# Patient Record
Sex: Female | Born: 1985 | Race: Black or African American | Hispanic: No | Marital: Single | Smoking: Former smoker
Health system: Southern US, Community
[De-identification: ages and names within clinical notes are randomized; demographics above are authoritative.]

## PROBLEM LIST (undated history)

## (undated) ENCOUNTER — Inpatient Hospital Stay (HOSPITAL_COMMUNITY): Payer: Self-pay

## (undated) ENCOUNTER — Emergency Department (HOSPITAL_COMMUNITY): Admission: EM

## (undated) DIAGNOSIS — A630 Anogenital (venereal) warts: Secondary | ICD-10-CM

## (undated) DIAGNOSIS — N390 Urinary tract infection, site not specified: Secondary | ICD-10-CM

## (undated) DIAGNOSIS — Z9289 Personal history of other medical treatment: Secondary | ICD-10-CM

## (undated) DIAGNOSIS — D649 Anemia, unspecified: Secondary | ICD-10-CM

## (undated) DIAGNOSIS — D219 Benign neoplasm of connective and other soft tissue, unspecified: Secondary | ICD-10-CM

## (undated) DIAGNOSIS — O139 Gestational [pregnancy-induced] hypertension without significant proteinuria, unspecified trimester: Secondary | ICD-10-CM

## (undated) DIAGNOSIS — K219 Gastro-esophageal reflux disease without esophagitis: Secondary | ICD-10-CM

## (undated) DIAGNOSIS — K5900 Constipation, unspecified: Secondary | ICD-10-CM

## (undated) HISTORY — PX: HERNIA REPAIR: SHX51

## (undated) HISTORY — PX: WISDOM TOOTH EXTRACTION: SHX21

---

## 2007-04-12 ENCOUNTER — Ambulatory Visit: Payer: Self-pay | Admitting: *Deleted

## 2007-04-14 ENCOUNTER — Ambulatory Visit (HOSPITAL_COMMUNITY): Admission: RE | Admit: 2007-04-14 | Discharge: 2007-04-14 | Payer: Self-pay | Admitting: Obstetrics

## 2007-04-26 ENCOUNTER — Ambulatory Visit: Payer: Self-pay | Admitting: *Deleted

## 2007-04-29 ENCOUNTER — Ambulatory Visit: Payer: Self-pay | Admitting: *Deleted

## 2007-04-29 ENCOUNTER — Inpatient Hospital Stay (HOSPITAL_COMMUNITY): Admission: AD | Admit: 2007-04-29 | Discharge: 2007-04-29 | Payer: Self-pay | Admitting: Obstetrics & Gynecology

## 2007-05-10 ENCOUNTER — Ambulatory Visit: Payer: Self-pay | Admitting: *Deleted

## 2007-05-12 ENCOUNTER — Ambulatory Visit (HOSPITAL_COMMUNITY): Admission: RE | Admit: 2007-05-12 | Discharge: 2007-05-12 | Payer: Self-pay | Admitting: *Deleted

## 2007-05-24 ENCOUNTER — Ambulatory Visit: Payer: Self-pay | Admitting: *Deleted

## 2007-05-26 ENCOUNTER — Ambulatory Visit (HOSPITAL_COMMUNITY): Admission: RE | Admit: 2007-05-26 | Discharge: 2007-05-26 | Payer: Self-pay | Admitting: *Deleted

## 2007-06-21 ENCOUNTER — Ambulatory Visit (HOSPITAL_COMMUNITY): Admission: RE | Admit: 2007-06-21 | Discharge: 2007-06-21 | Payer: Self-pay | Admitting: *Deleted

## 2007-06-21 ENCOUNTER — Ambulatory Visit: Payer: Self-pay | Admitting: Obstetrics & Gynecology

## 2007-06-28 ENCOUNTER — Ambulatory Visit: Payer: Self-pay | Admitting: *Deleted

## 2007-06-29 ENCOUNTER — Ambulatory Visit: Payer: Self-pay | Admitting: Obstetrics and Gynecology

## 2007-07-05 ENCOUNTER — Inpatient Hospital Stay (HOSPITAL_COMMUNITY): Admission: AD | Admit: 2007-07-05 | Discharge: 2007-07-08 | Payer: Self-pay | Admitting: Obstetrics & Gynecology

## 2007-07-05 ENCOUNTER — Ambulatory Visit: Payer: Self-pay | Admitting: Obstetrics & Gynecology

## 2007-07-05 ENCOUNTER — Encounter: Payer: Self-pay | Admitting: *Deleted

## 2009-01-29 ENCOUNTER — Inpatient Hospital Stay (HOSPITAL_COMMUNITY): Admission: AD | Admit: 2009-01-29 | Discharge: 2009-01-29 | Payer: Self-pay | Admitting: Obstetrics and Gynecology

## 2009-03-18 ENCOUNTER — Inpatient Hospital Stay (HOSPITAL_COMMUNITY): Admission: AD | Admit: 2009-03-18 | Discharge: 2009-03-18 | Payer: Self-pay | Admitting: Obstetrics & Gynecology

## 2009-04-05 ENCOUNTER — Inpatient Hospital Stay (HOSPITAL_COMMUNITY): Admission: AD | Admit: 2009-04-05 | Discharge: 2009-04-05 | Payer: Self-pay | Admitting: Obstetrics & Gynecology

## 2009-05-08 ENCOUNTER — Inpatient Hospital Stay (HOSPITAL_COMMUNITY): Admission: AD | Admit: 2009-05-08 | Discharge: 2009-05-08 | Payer: Self-pay | Admitting: Obstetrics & Gynecology

## 2009-06-17 ENCOUNTER — Inpatient Hospital Stay (HOSPITAL_COMMUNITY): Admission: AD | Admit: 2009-06-17 | Discharge: 2009-06-17 | Payer: Self-pay | Admitting: Obstetrics & Gynecology

## 2009-07-22 ENCOUNTER — Inpatient Hospital Stay (HOSPITAL_COMMUNITY): Admission: AD | Admit: 2009-07-22 | Discharge: 2009-07-22 | Payer: Self-pay | Admitting: Obstetrics and Gynecology

## 2009-08-04 IMAGING — US US OB DETAIL+14 WK
1 series · 14 of 28 positions shown · non-contrast
Comparison: none

OBSTETRICAL ULTRASOUND:
 This ultrasound was performed in The [HOSPITAL], and the [facility] report will be stored to [REDACTED] PACS.

[Series 1: us ob detail+14 wk · 14 of 95 slices shown]
[im 4/95]
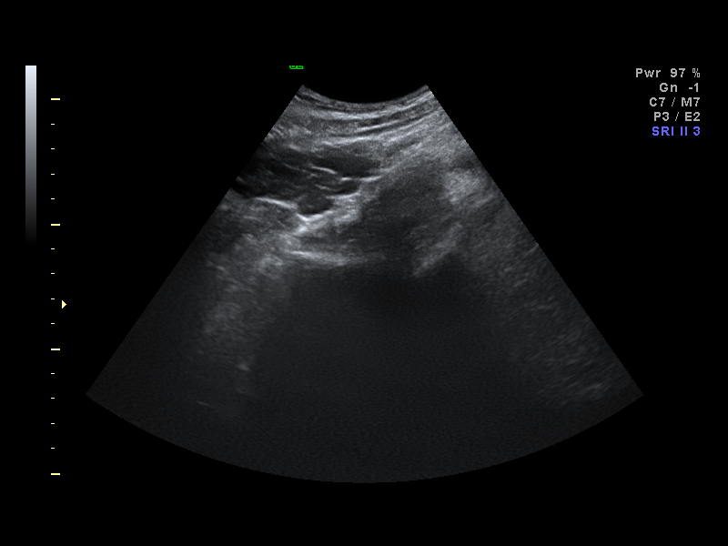
[im 11/95]
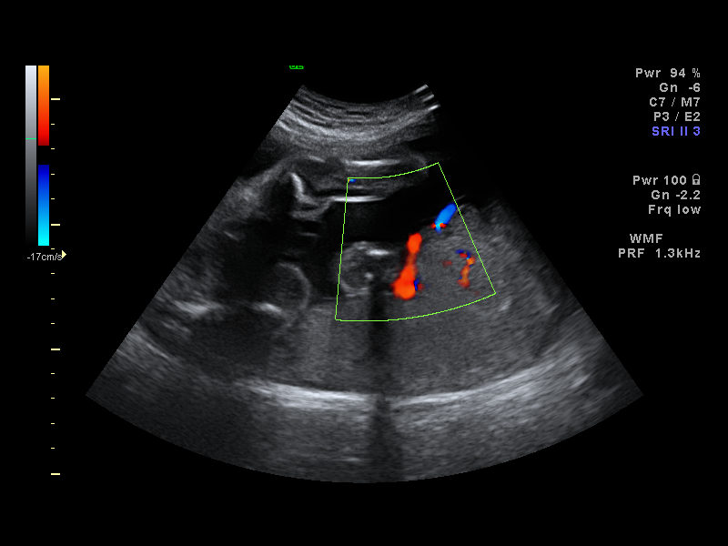
[im 18/95]
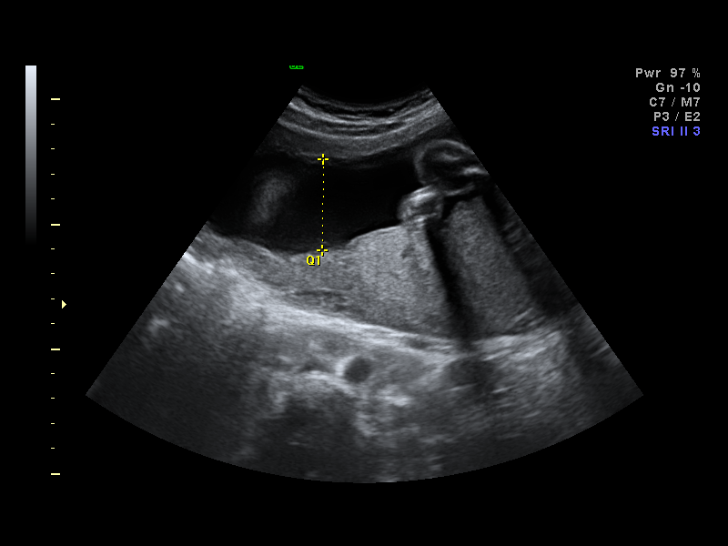
[im 25/95]
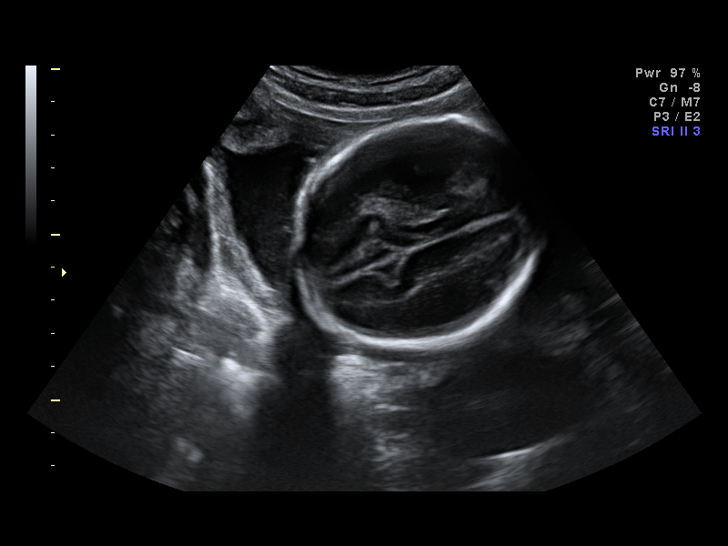
[im 32/95]
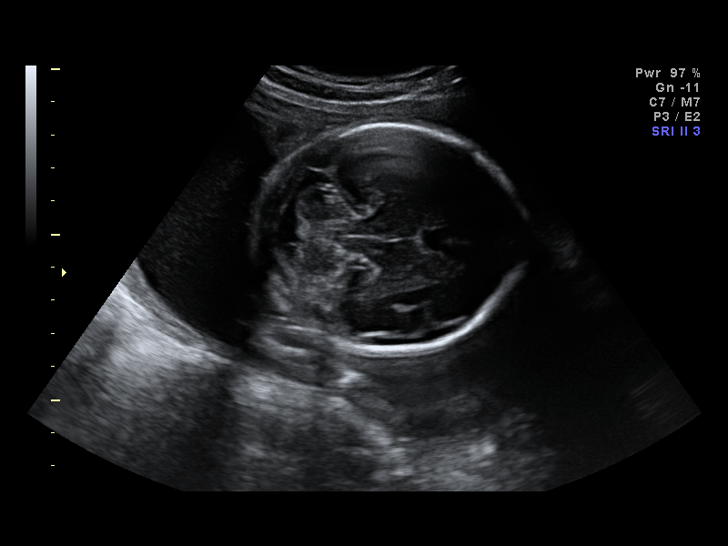
[im 39/95]
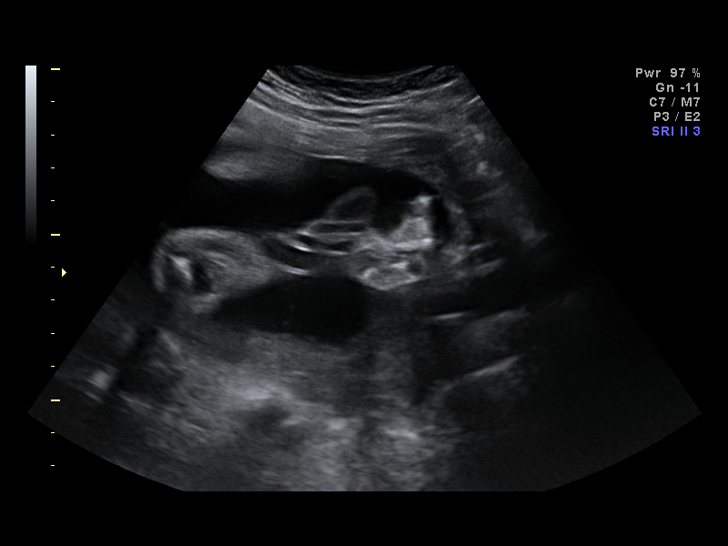
[im 46/95]
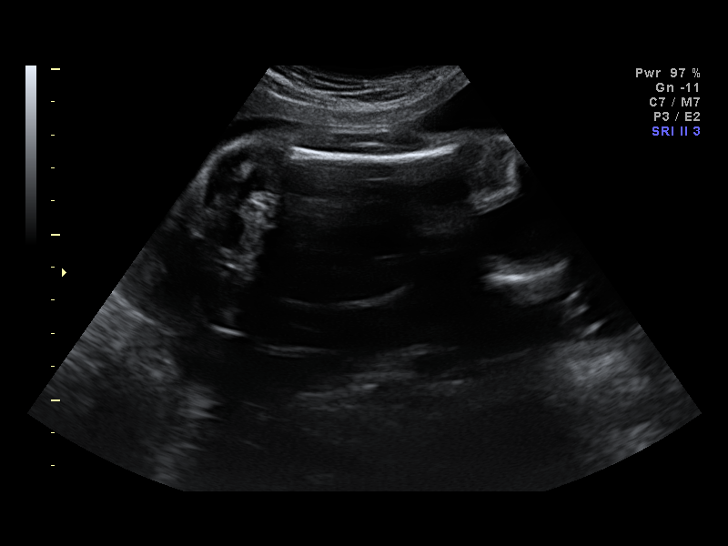
[im 53/95]
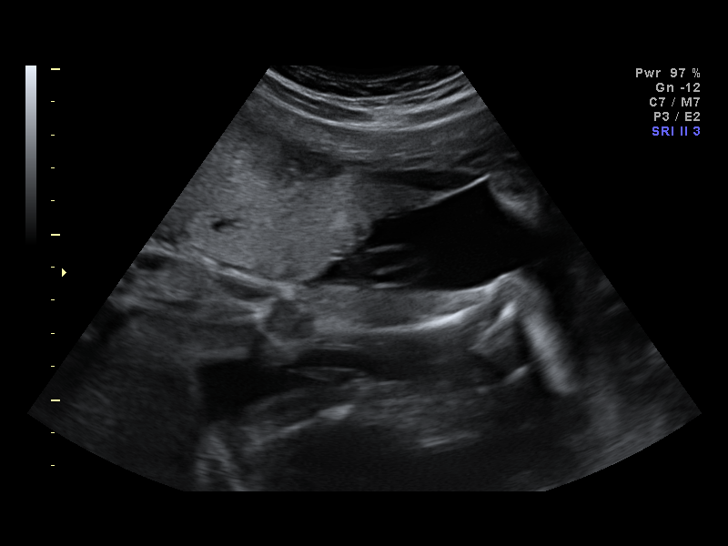
[im 60/95]
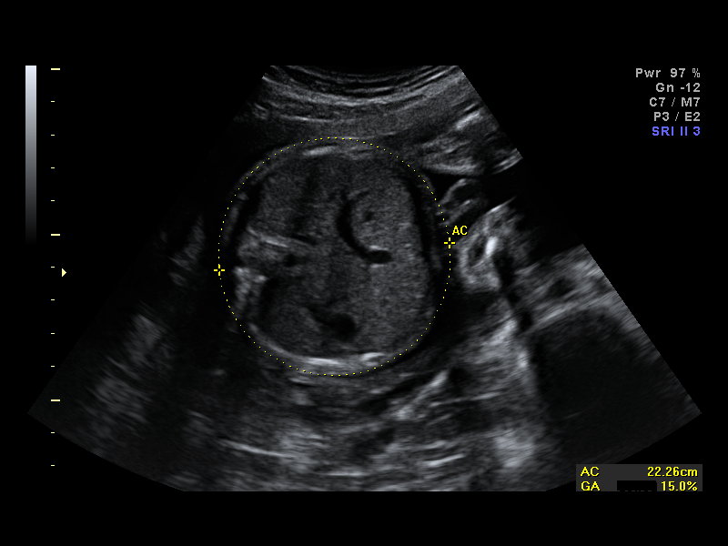
[im 67/95]
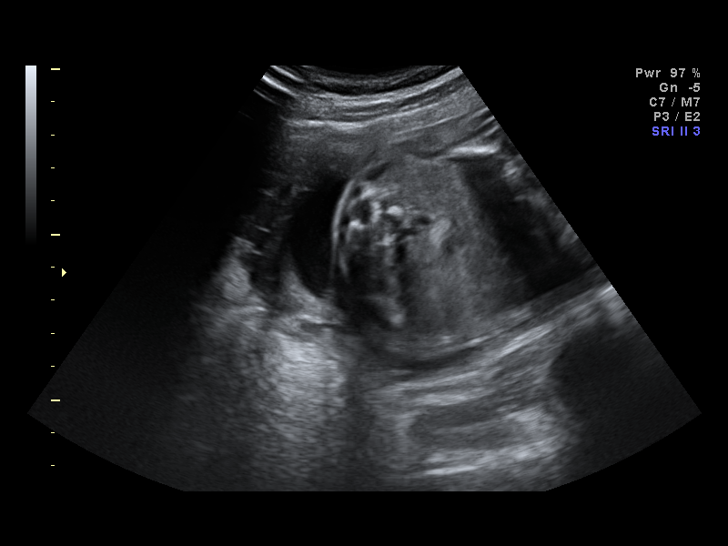
[im 74/95]
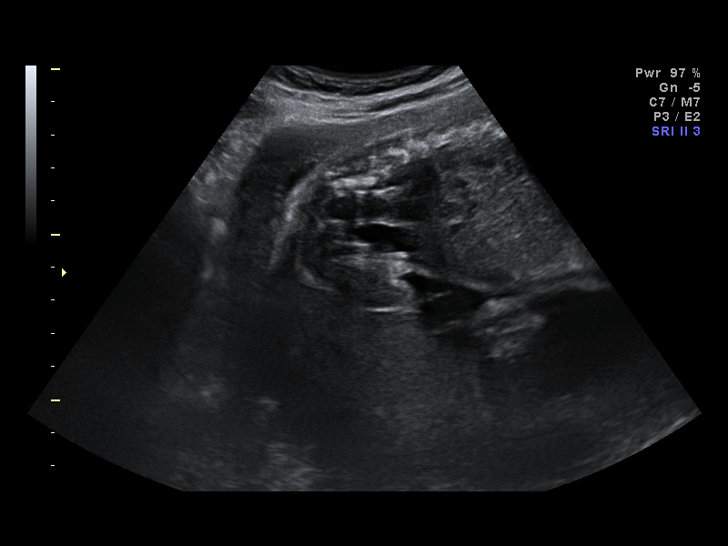
[im 81/95]
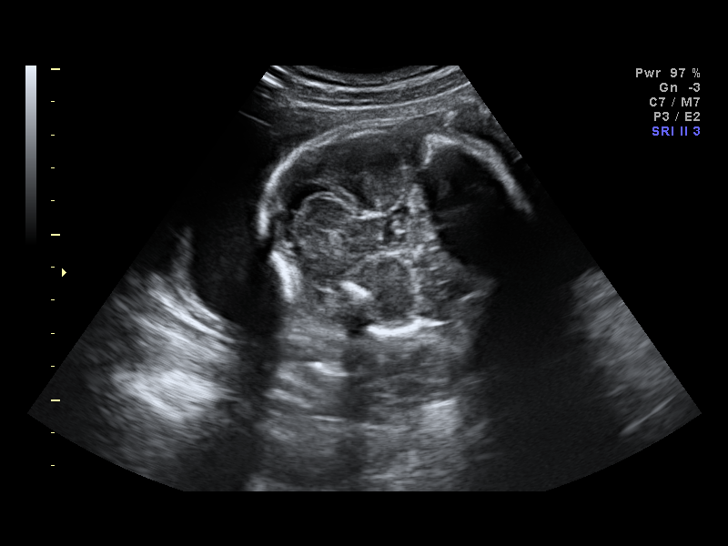
[im 88/95]
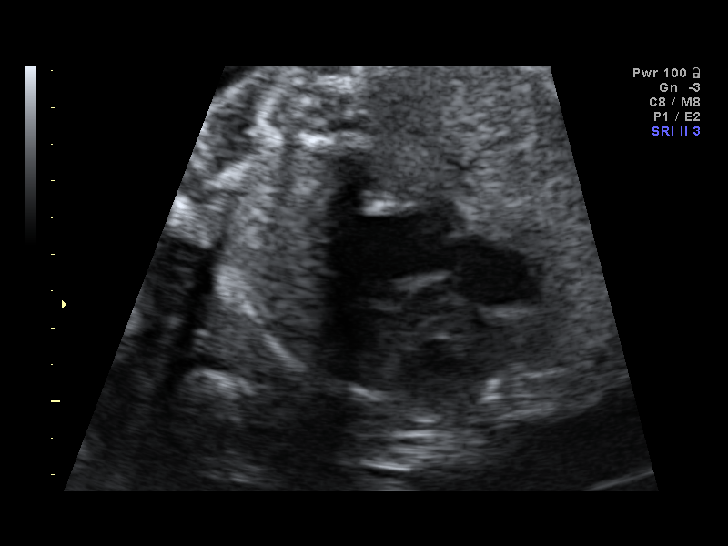
[im 95/95]
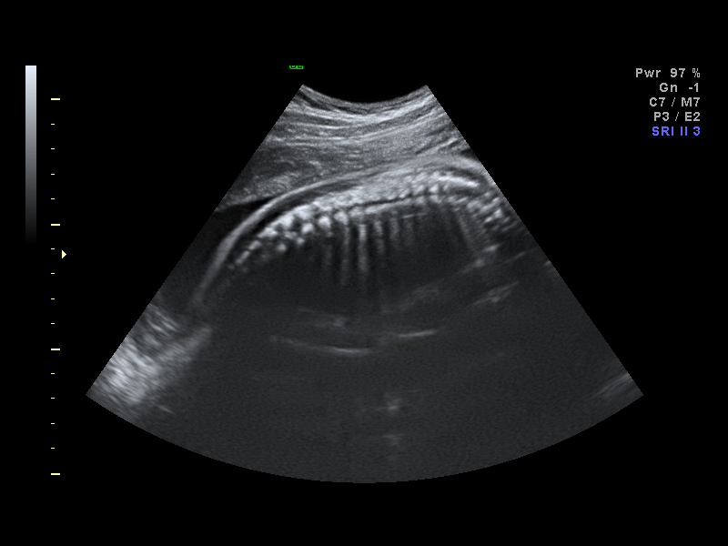

[14 of 28 positions shown; findings below may reference images not displayed]

IMPRESSION: The [facility] report has also been faxed to the ordering physician.

## 2009-09-01 IMAGING — US US OB FOLLOW-UP
1 series · 14 of 28 positions shown · non-contrast
Comparison: none

OBSTETRICAL ULTRASOUND:
 This ultrasound was performed in The [HOSPITAL], and the [facility] report will be stored to [REDACTED] PACS.

[Series 1: us ob follow-up · 34 acquisitions, 14 frames shown]
[im 2/34]
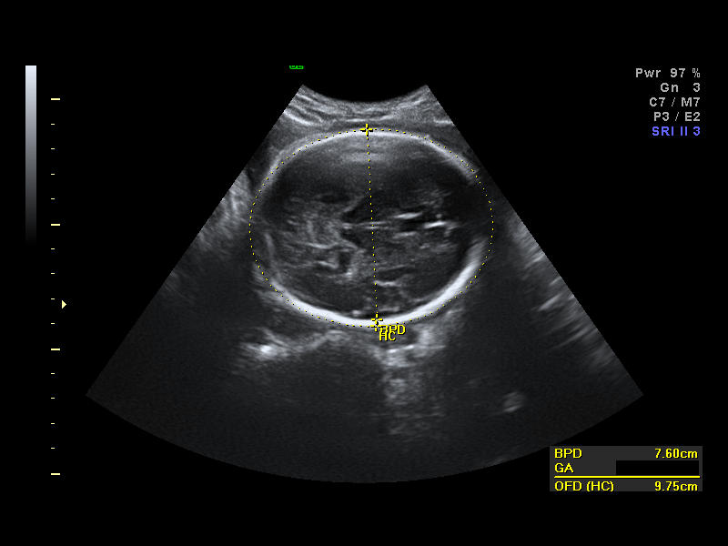
[im 4/34]
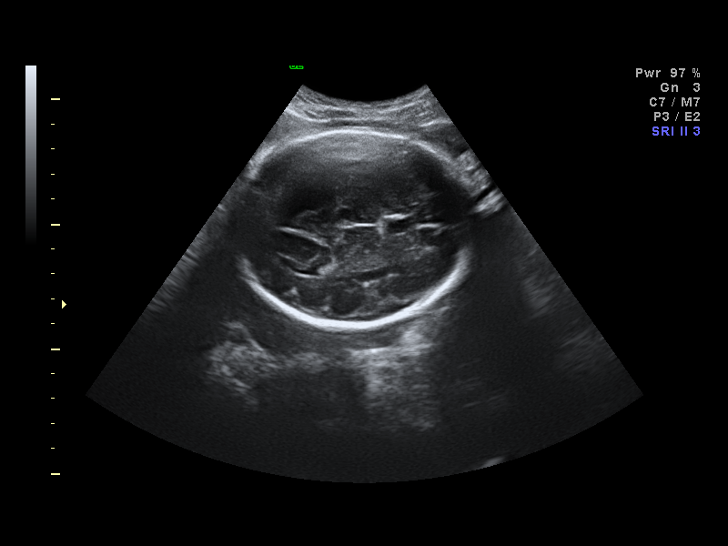
[im 7/34]
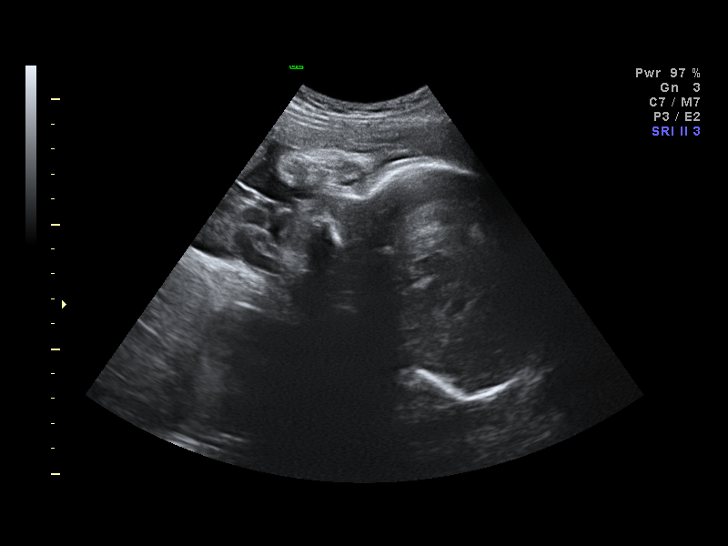
[im 9/34]
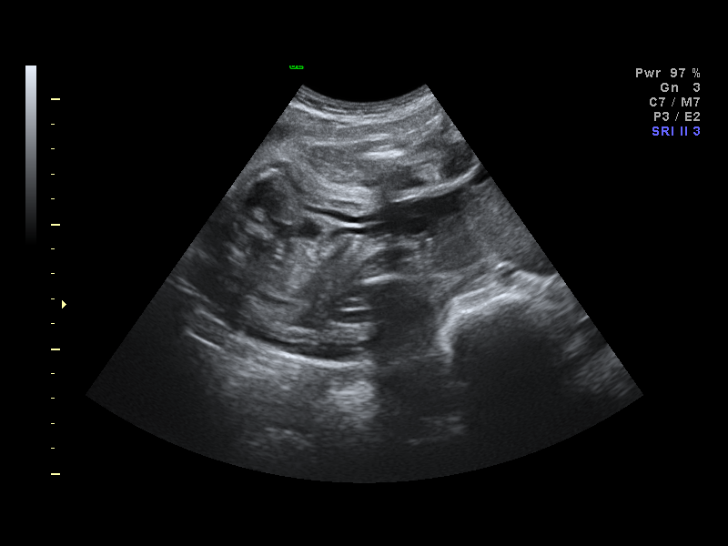
[im 12/34]
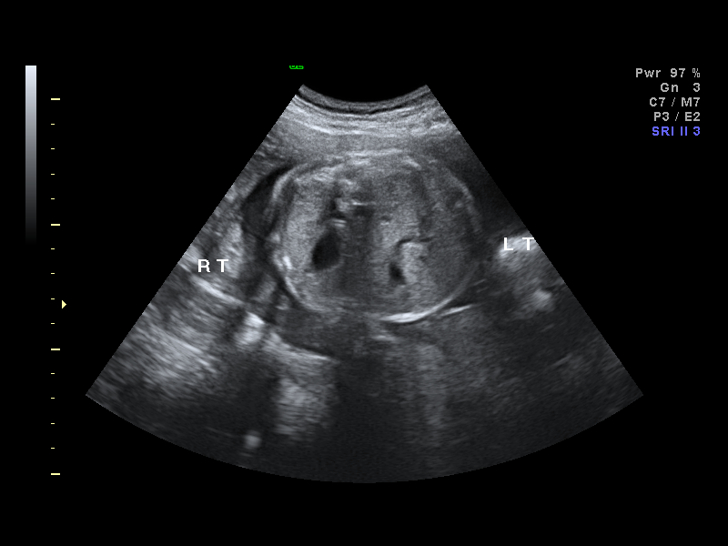
[im 14/34]
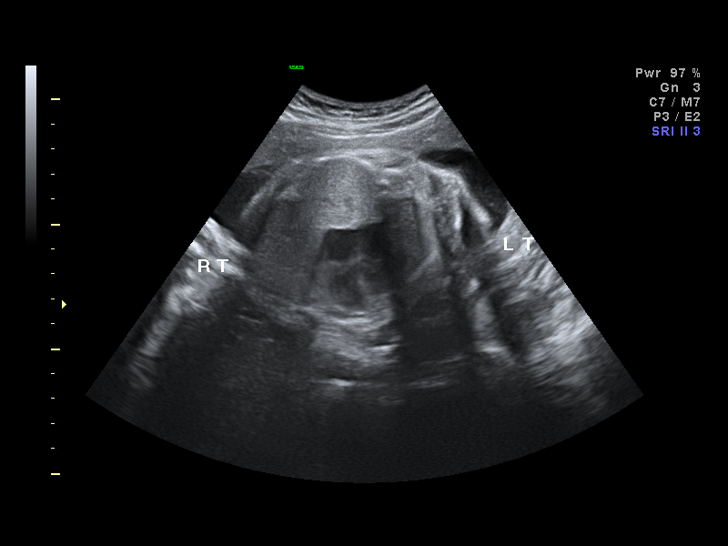
[im 16/34]
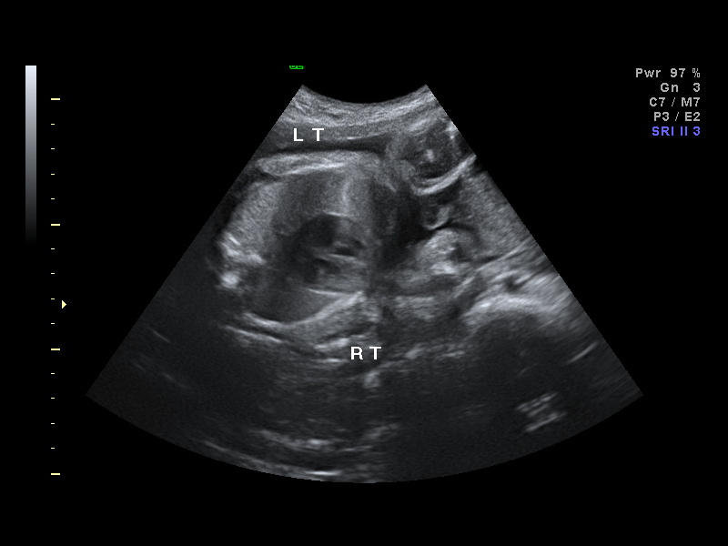
[im 19/34]
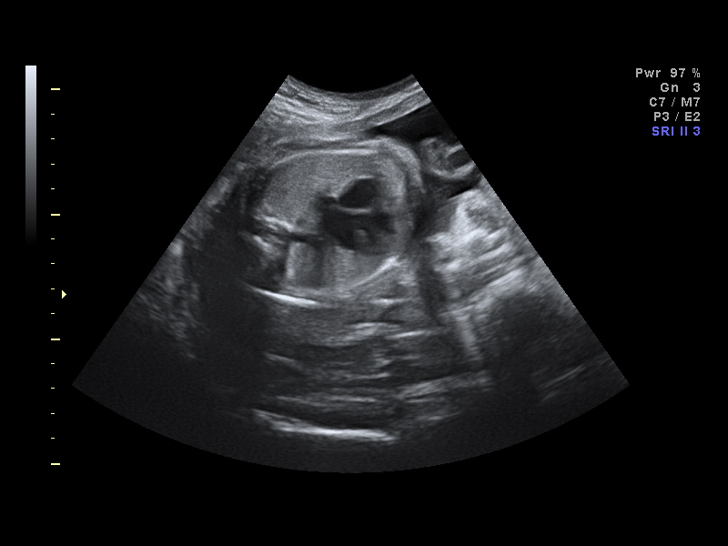
[im 21/34]
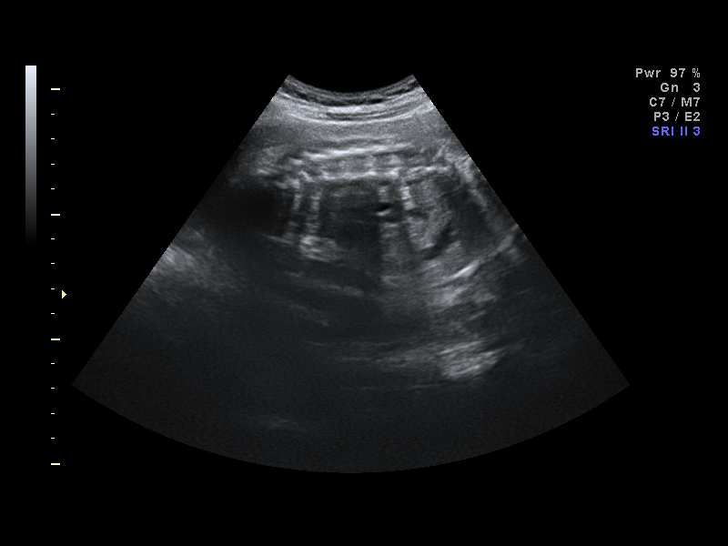
[im 24/34]
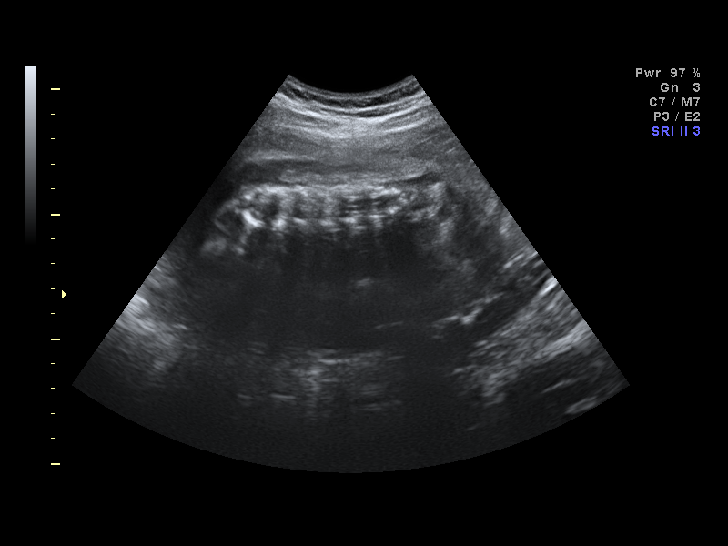
[im 26/34]
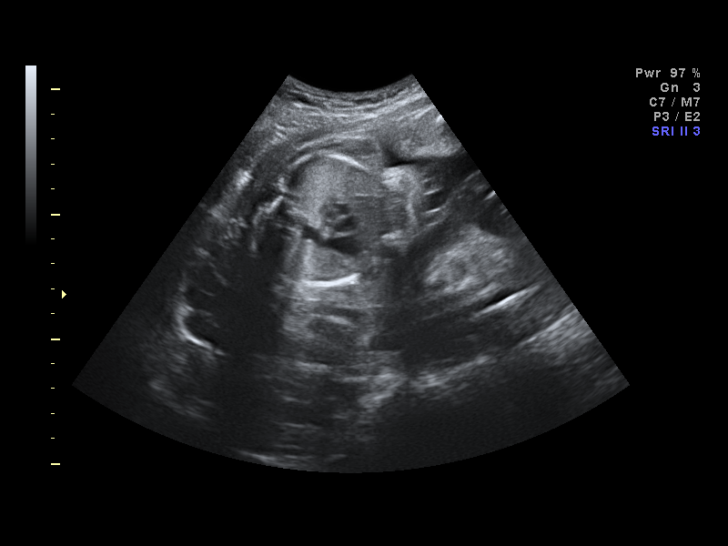
[im 29/34]
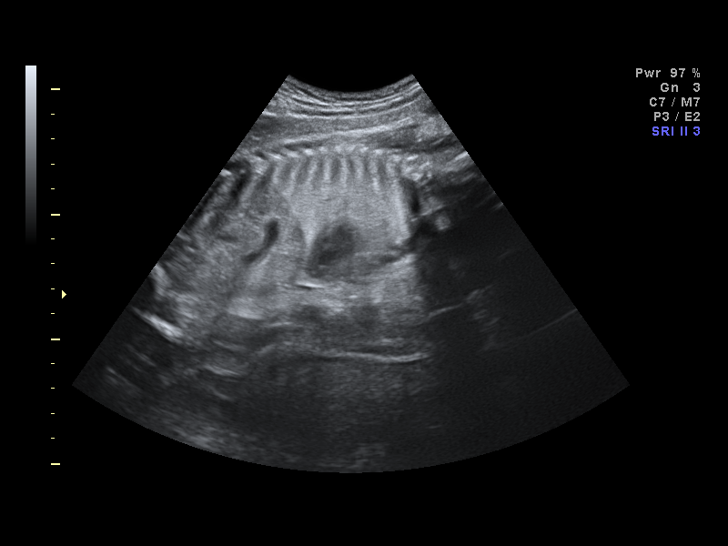
[im 31/34]
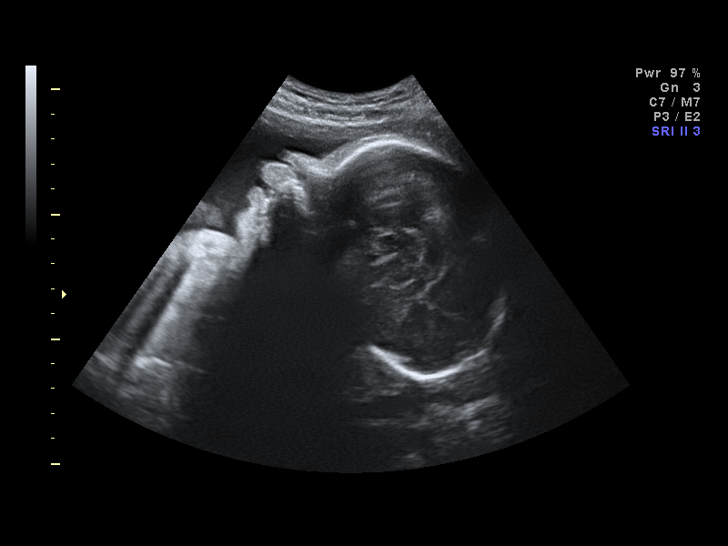
[im 34/34]
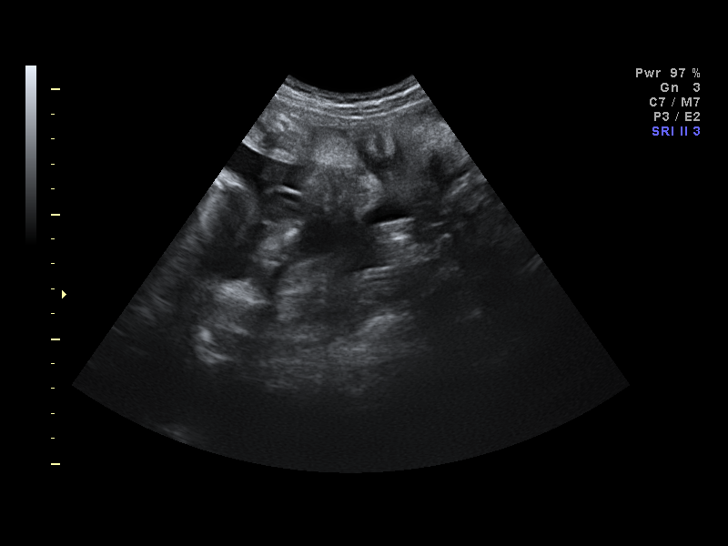

[14 of 28 positions shown; findings below may reference images not displayed]

IMPRESSION: The [facility] report has also been faxed to the ordering physician.

## 2009-11-02 ENCOUNTER — Inpatient Hospital Stay (HOSPITAL_COMMUNITY): Admission: AD | Admit: 2009-11-02 | Discharge: 2009-11-02 | Payer: Self-pay | Admitting: Obstetrics and Gynecology

## 2009-12-30 ENCOUNTER — Inpatient Hospital Stay (HOSPITAL_COMMUNITY): Admission: AD | Admit: 2009-12-30 | Discharge: 2009-12-30 | Payer: Self-pay | Admitting: Obstetrics & Gynecology

## 2009-12-30 ENCOUNTER — Ambulatory Visit: Payer: Self-pay | Admitting: Nurse Practitioner

## 2010-06-14 ENCOUNTER — Emergency Department (HOSPITAL_COMMUNITY): Admission: EM | Admit: 2010-06-14 | Discharge: 2010-06-14 | Payer: Self-pay | Admitting: Family Medicine

## 2010-09-30 ENCOUNTER — Emergency Department (HOSPITAL_COMMUNITY)
Admission: EM | Admit: 2010-09-30 | Discharge: 2010-10-01 | Disposition: A | Discharge status: Home / Self Care | Payer: Medicaid Other | Attending: Emergency Medicine | Admitting: Emergency Medicine

## 2010-09-30 DIAGNOSIS — N898 Other specified noninflammatory disorders of vagina: Secondary | ICD-10-CM | POA: Insufficient documentation

## 2010-09-30 DIAGNOSIS — R10819 Abdominal tenderness, unspecified site: Secondary | ICD-10-CM | POA: Insufficient documentation

## 2010-09-30 DIAGNOSIS — R109 Unspecified abdominal pain: Secondary | ICD-10-CM | POA: Insufficient documentation

## 2010-09-30 DIAGNOSIS — R300 Dysuria: Secondary | ICD-10-CM | POA: Insufficient documentation

## 2010-09-30 LAB — URINALYSIS, ROUTINE W REFLEX MICROSCOPIC
Bilirubin Urine: NEGATIVE
Glucose, UA: NEGATIVE mg/dL
Hgb urine dipstick: NEGATIVE
Ketones, ur: 15 mg/dL — AB
Leukocytes, UA: NEGATIVE
Nitrite: NEGATIVE
Protein, ur: 30 mg/dL — AB
Specific Gravity, Urine: 1.037 — ABNORMAL HIGH (ref 1.005–1.030)
Urobilinogen, UA: 1 mg/dL (ref 0.0–1.0)
pH: 6 (ref 5.0–8.0)

## 2010-09-30 LAB — URINE MICROSCOPIC-ADD ON

## 2010-09-30 LAB — POCT PREGNANCY, URINE: Preg Test, Ur: NEGATIVE

## 2010-10-01 LAB — GC/CHLAMYDIA PROBE AMP, GENITAL
Chlamydia, DNA Probe: NEGATIVE
GC Probe Amp, Genital: NEGATIVE

## 2010-10-01 LAB — WET PREP, GENITAL
Clue Cells Wet Prep HPF POC: NONE SEEN
Trich, Wet Prep: NONE SEEN
Yeast Wet Prep HPF POC: NONE SEEN

## 2010-10-02 LAB — URINE CULTURE
Colony Count: NO GROWTH
Culture  Setup Time: 201203081033
Culture: NO GROWTH

## 2010-10-06 LAB — URINE CULTURE
Colony Count: 55000
Culture  Setup Time: 201111202357

## 2010-10-06 LAB — POCT URINALYSIS DIPSTICK
Glucose, UA: NEGATIVE mg/dL
Ketones, ur: 15 mg/dL — AB
Nitrite: NEGATIVE
Protein, ur: NEGATIVE mg/dL
Specific Gravity, Urine: >=1.03 (ref 1.005–1.030)
Urobilinogen, UA: >=8 mg/dL (ref 0.0–1.0)
pH: 6 (ref 5.0–8.0)

## 2010-10-06 LAB — POCT PREGNANCY, URINE: Preg Test, Ur: NEGATIVE

## 2010-10-12 LAB — WET PREP, GENITAL
Trich, Wet Prep: NONE SEEN
Yeast Wet Prep HPF POC: NONE SEEN

## 2010-10-12 LAB — URINE MICROSCOPIC-ADD ON

## 2010-10-12 LAB — URINALYSIS, ROUTINE W REFLEX MICROSCOPIC
Bilirubin Urine: NEGATIVE
Glucose, UA: NEGATIVE mg/dL
Ketones, ur: 15 mg/dL — AB
Leukocytes, UA: NEGATIVE
Nitrite: NEGATIVE
Protein, ur: NEGATIVE mg/dL
Specific Gravity, Urine: 1.025 (ref 1.005–1.030)
Urobilinogen, UA: 0.2 mg/dL (ref 0.0–1.0)
pH: 6.5 (ref 5.0–8.0)

## 2010-10-12 LAB — GC/CHLAMYDIA PROBE AMP, GENITAL
Chlamydia, DNA Probe: NEGATIVE
GC Probe Amp, Genital: NEGATIVE

## 2010-10-12 LAB — POCT PREGNANCY, URINE: Preg Test, Ur: NEGATIVE

## 2010-10-14 LAB — URINALYSIS, ROUTINE W REFLEX MICROSCOPIC
Bilirubin Urine: NEGATIVE
Glucose, UA: 500 mg/dL — AB
Ketones, ur: 15 mg/dL — AB
Nitrite: POSITIVE — AB
Protein, ur: >300 mg/dL — AB
Specific Gravity, Urine: 1.015 (ref 1.005–1.030)
Urobilinogen, UA: >8 mg/dL — ABNORMAL HIGH (ref 0.0–1.0)
pH: 6.5 (ref 5.0–8.0)

## 2010-10-14 LAB — URINE MICROSCOPIC-ADD ON

## 2010-10-14 LAB — POCT PREGNANCY, URINE: Preg Test, Ur: NEGATIVE

## 2010-10-14 LAB — GC/CHLAMYDIA PROBE AMP, GENITAL
Chlamydia, DNA Probe: NEGATIVE
GC Probe Amp, Genital: NEGATIVE

## 2010-10-14 LAB — WET PREP, GENITAL
Trich, Wet Prep: NONE SEEN
Yeast Wet Prep HPF POC: NONE SEEN

## 2010-10-26 LAB — URINALYSIS, ROUTINE W REFLEX MICROSCOPIC
Bilirubin Urine: NEGATIVE
Glucose, UA: 100 mg/dL — AB
Hgb urine dipstick: NEGATIVE
Ketones, ur: 15 mg/dL — AB
Leukocytes, UA: NEGATIVE
Nitrite: POSITIVE — AB
Protein, ur: 30 mg/dL — AB
Specific Gravity, Urine: 1.025 (ref 1.005–1.030)
Urobilinogen, UA: 4 mg/dL — ABNORMAL HIGH (ref 0.0–1.0)
pH: 6 (ref 5.0–8.0)

## 2010-10-26 LAB — URINE CULTURE: Colony Count: >=100000

## 2010-10-26 LAB — URINE MICROSCOPIC-ADD ON

## 2010-10-28 LAB — POCT PREGNANCY, URINE: Preg Test, Ur: NEGATIVE

## 2010-10-28 LAB — URINE CULTURE: Colony Count: >=100000

## 2010-10-28 LAB — URINALYSIS, ROUTINE W REFLEX MICROSCOPIC
Bilirubin Urine: NEGATIVE
Glucose, UA: 100 mg/dL — AB
Ketones, ur: NEGATIVE mg/dL
Nitrite: POSITIVE — AB
Protein, ur: 30 mg/dL — AB
Specific Gravity, Urine: 1.02 (ref 1.005–1.030)
Urobilinogen, UA: 2 mg/dL — ABNORMAL HIGH (ref 0.0–1.0)
pH: 7 (ref 5.0–8.0)

## 2010-10-28 LAB — URINE MICROSCOPIC-ADD ON

## 2010-10-29 LAB — URINALYSIS, ROUTINE W REFLEX MICROSCOPIC
Bilirubin Urine: NEGATIVE
Glucose, UA: NEGATIVE mg/dL
Hgb urine dipstick: NEGATIVE
Ketones, ur: NEGATIVE mg/dL
Nitrite: NEGATIVE
Protein, ur: NEGATIVE mg/dL
Specific Gravity, Urine: 1.01 (ref 1.005–1.030)
Urobilinogen, UA: 1 mg/dL (ref 0.0–1.0)
pH: 6.5 (ref 5.0–8.0)

## 2010-10-29 LAB — URINE MICROSCOPIC-ADD ON

## 2010-10-29 LAB — POCT PREGNANCY, URINE: Preg Test, Ur: NEGATIVE

## 2010-10-30 LAB — URINALYSIS, ROUTINE W REFLEX MICROSCOPIC
Bilirubin Urine: NEGATIVE
Glucose, UA: NEGATIVE mg/dL
Ketones, ur: 15 mg/dL — AB
Leukocytes, UA: NEGATIVE
Nitrite: NEGATIVE
Protein, ur: NEGATIVE mg/dL
Specific Gravity, Urine: 1.025 (ref 1.005–1.030)
Urobilinogen, UA: 0.2 mg/dL (ref 0.0–1.0)
pH: 6 (ref 5.0–8.0)

## 2010-10-30 LAB — URINE MICROSCOPIC-ADD ON: WBC, UA: NONE SEEN WBC/hpf (ref <3)

## 2010-10-30 LAB — WET PREP, GENITAL
Trich, Wet Prep: NONE SEEN
Yeast Wet Prep HPF POC: NONE SEEN

## 2010-10-30 LAB — GC/CHLAMYDIA PROBE AMP, GENITAL
Chlamydia, DNA Probe: NEGATIVE
GC Probe Amp, Genital: NEGATIVE

## 2010-10-31 LAB — URINALYSIS, ROUTINE W REFLEX MICROSCOPIC
Bilirubin Urine: NEGATIVE
Glucose, UA: NEGATIVE mg/dL
Ketones, ur: NEGATIVE mg/dL
Nitrite: NEGATIVE
Protein, ur: NEGATIVE mg/dL
Specific Gravity, Urine: 1.015 (ref 1.005–1.030)
Urobilinogen, UA: 0.2 mg/dL (ref 0.0–1.0)
pH: 7 (ref 5.0–8.0)

## 2010-10-31 LAB — POCT PREGNANCY, URINE: Preg Test, Ur: NEGATIVE

## 2010-10-31 LAB — URINE MICROSCOPIC-ADD ON

## 2010-11-01 LAB — URINALYSIS, ROUTINE W REFLEX MICROSCOPIC
Bilirubin Urine: NEGATIVE
Glucose, UA: NEGATIVE mg/dL
Hgb urine dipstick: NEGATIVE
Ketones, ur: NEGATIVE mg/dL
Nitrite: NEGATIVE
Protein, ur: NEGATIVE mg/dL
Specific Gravity, Urine: 1.02 (ref 1.005–1.030)
Urobilinogen, UA: 1 mg/dL (ref 0.0–1.0)
pH: 6.5 (ref 5.0–8.0)

## 2010-11-01 LAB — GC/CHLAMYDIA PROBE AMP, GENITAL
Chlamydia, DNA Probe: NEGATIVE
GC Probe Amp, Genital: NEGATIVE

## 2010-11-01 LAB — WET PREP, GENITAL
Trich, Wet Prep: NONE SEEN
Yeast Wet Prep HPF POC: NONE SEEN

## 2010-12-10 ENCOUNTER — Inpatient Hospital Stay (HOSPITAL_COMMUNITY)
Admission: AD | Admit: 2010-12-10 | Discharge: 2010-12-10 | Disposition: A | Discharge status: Home / Self Care | Payer: Medicaid Other | Source: Ambulatory Visit | Attending: Family Medicine | Admitting: Family Medicine

## 2010-12-10 DIAGNOSIS — N390 Urinary tract infection, site not specified: Secondary | ICD-10-CM | POA: Insufficient documentation

## 2010-12-10 DIAGNOSIS — R300 Dysuria: Secondary | ICD-10-CM | POA: Insufficient documentation

## 2010-12-10 DIAGNOSIS — N760 Acute vaginitis: Secondary | ICD-10-CM | POA: Insufficient documentation

## 2010-12-10 DIAGNOSIS — B9689 Other specified bacterial agents as the cause of diseases classified elsewhere: Secondary | ICD-10-CM | POA: Insufficient documentation

## 2010-12-10 DIAGNOSIS — A499 Bacterial infection, unspecified: Secondary | ICD-10-CM | POA: Insufficient documentation

## 2010-12-10 LAB — URINALYSIS, ROUTINE W REFLEX MICROSCOPIC
Bilirubin Urine: NEGATIVE
Glucose, UA: NEGATIVE mg/dL
Ketones, ur: NEGATIVE mg/dL
Nitrite: POSITIVE — AB
Protein, ur: NEGATIVE mg/dL
Specific Gravity, Urine: 1.025 (ref 1.005–1.030)
Urobilinogen, UA: 1 mg/dL (ref 0.0–1.0)
pH: 7 (ref 5.0–8.0)

## 2010-12-10 LAB — WET PREP, GENITAL
Trich, Wet Prep: NONE SEEN
Yeast Wet Prep HPF POC: NONE SEEN

## 2010-12-10 LAB — URINE MICROSCOPIC-ADD ON

## 2010-12-10 LAB — POCT PREGNANCY, URINE: Preg Test, Ur: NEGATIVE

## 2010-12-11 LAB — GC/CHLAMYDIA PROBE AMP, GENITAL
Chlamydia, DNA Probe: NEGATIVE
GC Probe Amp, Genital: NEGATIVE

## 2010-12-19 ENCOUNTER — Inpatient Hospital Stay (INDEPENDENT_AMBULATORY_CARE_PROVIDER_SITE_OTHER)
Admission: RE | Admit: 2010-12-19 | Discharge: 2010-12-19 | Disposition: A | Discharge status: Home / Self Care | Payer: Medicaid Other | Source: Ambulatory Visit | Attending: Emergency Medicine | Admitting: Emergency Medicine

## 2010-12-19 DIAGNOSIS — W503XXA Accidental bite by another person, initial encounter: Secondary | ICD-10-CM

## 2010-12-19 DIAGNOSIS — T148XXA Other injury of unspecified body region, initial encounter: Secondary | ICD-10-CM

## 2011-01-23 ENCOUNTER — Inpatient Hospital Stay (HOSPITAL_COMMUNITY)
Admission: AD | Admit: 2011-01-23 | Discharge: 2011-01-23 | Disposition: A | Discharge status: Home / Self Care | Payer: Medicaid Other | Source: Ambulatory Visit | Attending: Obstetrics and Gynecology | Admitting: Obstetrics and Gynecology

## 2011-01-23 DIAGNOSIS — N3000 Acute cystitis without hematuria: Secondary | ICD-10-CM | POA: Insufficient documentation

## 2011-01-23 LAB — URINALYSIS, ROUTINE W REFLEX MICROSCOPIC
Bilirubin Urine: NEGATIVE
Glucose, UA: NEGATIVE mg/dL
Ketones, ur: NEGATIVE mg/dL
Leukocytes, UA: NEGATIVE
Nitrite: NEGATIVE
Protein, ur: NEGATIVE mg/dL
Specific Gravity, Urine: >1.03 — ABNORMAL HIGH (ref 1.005–1.030)
Urobilinogen, UA: 0.2 mg/dL (ref 0.0–1.0)
pH: 6 (ref 5.0–8.0)

## 2011-01-23 LAB — URINE MICROSCOPIC-ADD ON

## 2011-01-23 LAB — POCT PREGNANCY, URINE: Preg Test, Ur: NEGATIVE

## 2011-01-24 LAB — URINE CULTURE
Colony Count: 10000
Culture  Setup Time: 201206301755

## 2011-05-03 LAB — POCT URINALYSIS DIP (DEVICE)
Glucose, UA: NEGATIVE
Glucose, UA: NEGATIVE
Hgb urine dipstick: NEGATIVE
Hgb urine dipstick: NEGATIVE
Ketones, ur: NEGATIVE
Nitrite: NEGATIVE
Nitrite: NEGATIVE
Operator id: 135281
Operator id: 148111
Protein, ur: 30 — AB
Protein, ur: 30 — AB
Specific Gravity, Urine: 1.025
Specific Gravity, Urine: >=1.03
Urobilinogen, UA: 1
Urobilinogen, UA: 1
pH: 6
pH: 7

## 2011-05-03 LAB — CBC
HCT: 25.6 — ABNORMAL LOW
HCT: 34.5 — ABNORMAL LOW
Hemoglobin: 11.3 — ABNORMAL LOW
Hemoglobin: 8.3 — ABNORMAL LOW
MCHC: 32.4
MCHC: 32.8
MCV: 73.6 — ABNORMAL LOW
MCV: 74.1 — ABNORMAL LOW
Platelets: 175
Platelets: 215
RBC: 3.45 — ABNORMAL LOW
RBC: 4.69
RDW: 15.4
RDW: 15.6 — ABNORMAL HIGH
WBC: 14.1 — ABNORMAL HIGH
WBC: 8.3

## 2011-05-03 LAB — RPR: RPR Ser Ql: NONREACTIVE

## 2011-05-04 LAB — POCT URINALYSIS DIP (DEVICE)
Bilirubin Urine: NEGATIVE
Glucose, UA: NEGATIVE
Hgb urine dipstick: NEGATIVE
Ketones, ur: NEGATIVE
Nitrite: NEGATIVE
Operator id: 159681
Protein, ur: NEGATIVE
Specific Gravity, Urine: 1.02
Urobilinogen, UA: 0.2
pH: 7

## 2011-05-05 LAB — POCT URINALYSIS DIP (DEVICE)
Bilirubin Urine: NEGATIVE
Bilirubin Urine: NEGATIVE
Glucose, UA: NEGATIVE
Glucose, UA: NEGATIVE
Hgb urine dipstick: NEGATIVE
Hgb urine dipstick: NEGATIVE
Ketones, ur: NEGATIVE
Ketones, ur: NEGATIVE
Nitrite: NEGATIVE
Nitrite: NEGATIVE
Operator id: 148111
Operator id: 297281
Protein, ur: 30 — AB
Protein, ur: NEGATIVE
Specific Gravity, Urine: 1.02
Specific Gravity, Urine: 1.02
Urobilinogen, UA: 0.2
Urobilinogen, UA: 1
pH: 7
pH: 7

## 2011-05-06 LAB — URINALYSIS, ROUTINE W REFLEX MICROSCOPIC
Bilirubin Urine: NEGATIVE
Glucose, UA: 500 — AB
Hgb urine dipstick: NEGATIVE
Ketones, ur: 15 — AB
Nitrite: NEGATIVE
Protein, ur: NEGATIVE
Specific Gravity, Urine: >1.03 — ABNORMAL HIGH
Urobilinogen, UA: 0.2
pH: 6

## 2011-05-06 LAB — COMPREHENSIVE METABOLIC PANEL
ALT: 9
AST: 15
Albumin: 2.6 — ABNORMAL LOW
Alkaline Phosphatase: 77
BUN: 6
CO2: 25
Calcium: 9
Chloride: 104
Creatinine, Ser: 0.77
GFR calc Af Amer: >60
GFR calc non Af Amer: >60
Glucose, Bld: 85
Potassium: 3.7
Sodium: 135
Total Bilirubin: 0.5
Total Protein: 6.4

## 2011-05-06 LAB — POCT URINALYSIS DIP (DEVICE)
Bilirubin Urine: NEGATIVE
Glucose, UA: NEGATIVE
Hgb urine dipstick: NEGATIVE
Ketones, ur: 40 — AB
Nitrite: NEGATIVE
Operator id: 120861
Protein, ur: NEGATIVE
Specific Gravity, Urine: 1.02
Urobilinogen, UA: 0.2
pH: 7

## 2011-05-06 LAB — CBC
HCT: 30.2 — ABNORMAL LOW
Hemoglobin: 10.1 — ABNORMAL LOW
MCHC: 33.6
MCV: 77.8 — ABNORMAL LOW
Platelets: 271
RBC: 3.88
RDW: 13.7
WBC: 8.1

## 2011-05-06 LAB — LIPASE, BLOOD: Lipase: 24

## 2011-05-06 LAB — AMYLASE: Amylase: 126

## 2011-07-05 ENCOUNTER — Inpatient Hospital Stay (HOSPITAL_COMMUNITY)
Admission: AD | Admit: 2011-07-05 | Discharge: 2011-07-05 | Disposition: A | Discharge status: Home / Self Care | Payer: Medicaid Other | Source: Ambulatory Visit | Attending: Obstetrics & Gynecology | Admitting: Obstetrics & Gynecology

## 2011-07-05 DIAGNOSIS — N3090 Cystitis, unspecified without hematuria: Secondary | ICD-10-CM | POA: Insufficient documentation

## 2011-07-05 DIAGNOSIS — N3091 Cystitis, unspecified with hematuria: Secondary | ICD-10-CM

## 2011-07-05 DIAGNOSIS — R350 Frequency of micturition: Secondary | ICD-10-CM | POA: Insufficient documentation

## 2011-07-05 LAB — URINALYSIS, ROUTINE W REFLEX MICROSCOPIC
Bilirubin Urine: NEGATIVE
Glucose, UA: NEGATIVE mg/dL
Ketones, ur: NEGATIVE mg/dL
Leukocytes, UA: NEGATIVE
Nitrite: NEGATIVE
Protein, ur: NEGATIVE mg/dL
Specific Gravity, Urine: 1.02 (ref 1.005–1.030)
Urobilinogen, UA: 1 mg/dL (ref 0.0–1.0)
pH: 6 (ref 5.0–8.0)

## 2011-07-05 LAB — URINE MICROSCOPIC-ADD ON

## 2011-07-05 LAB — POCT PREGNANCY, URINE: Preg Test, Ur: NEGATIVE

## 2011-07-05 MED ORDER — SULFAMETHOXAZOLE-TRIMETHOPRIM 800-160 MG PO TABS: 1.0000 | ORAL_TABLET | Freq: Two times a day (BID) | ORAL | Status: DC

## 2011-07-05 MED ORDER — PHENAZOPYRIDINE HCL 200 MG PO TABS: 200.0000 mg | ORAL_TABLET | Freq: Three times a day (TID) | ORAL | Status: AC

## 2011-07-05 NOTE — Progress Notes (Signed)
UTI s/s late for Depo Shot, [facility]

## 2011-07-05 NOTE — Progress Notes (Signed)
Patent to MAU with c/o urinary frequency and irritation with voiding.

## 2011-07-05 NOTE — ED Provider Notes (Signed)
History     CSN: [id number] Arrival date & time: 07/05/2011 11:08 AM   None     Chief Complaint  Patient presents with  . Urinary Frequency    HPI DAROTHY Bowen is a 25 y.o. female who presents to MAU for possible bladder infection. Symptoms started last week with frequency, burning. Has had UTI's in the past and this feels the same.  Denies fever, chills or back pain. Current sex partner x 8 years. Hx of genital warts 2007 nothing since then. Last pap smear less than one year ago and was normal. LMP November, Depo provera for [employer]. Next shot scheduled 07/08/11. The history was provided by the patient.   No past medical history on file.  No past surgical history on file.  No family history on file.  History  Substance Use Topics  . Smoking status: Not on file  . Smokeless tobacco: Not on file  . Alcohol Use: Not on file    OB History    No data available      Review of Systems  Constitutional: Negative for fever, chills, diaphoresis and fatigue.  HENT: Negative for ear pain, congestion, sore throat, facial swelling, neck pain, neck stiffness, dental problem and sinus pressure.   Eyes: Negative for photophobia, pain and discharge.  Respiratory: Negative for cough, chest tightness and wheezing.   Cardiovascular: Negative.   Gastrointestinal: Negative for nausea, vomiting, abdominal pain, diarrhea, constipation and abdominal distention.  Genitourinary: Positive for urgency, frequency and dyspareunia. Negative for dysuria, flank pain and difficulty urinating.  Musculoskeletal: Negative for myalgias, back pain and gait problem.  Skin: Negative for color change and rash.  Neurological: Positive for headaches. Negative for dizziness, speech difficulty, weakness, light-headedness and numbness.  Psychiatric/Behavioral: Negative for confusion and agitation.    Allergies  Review of patient's allergies indicates no known allergies.  Home Medications  No current  outpatient prescriptions on file.  BP 128/73  Pulse 88  Temp(Src) 98.9 F (37.2 C) (Oral)  Resp 16  Ht 5' 5.5" (1.664 m)  Wt 123 lb (55.792 kg)  BMI 20.16 kg/m2  SpO2 99%  LMP 05/27/2011  Physical Exam  Nursing note and vitals reviewed. Constitutional: She is oriented to person, place, and time. She appears well-developed and well-nourished.  HENT:  Head: Normocephalic and atraumatic.  Eyes: EOM are normal.  Neck: Neck supple.  Pulmonary/Chest: Effort normal.  Abdominal: Soft. There is tenderness in the suprapubic area.  Musculoskeletal: Normal range of motion.  Neurological: She is alert and oriented to person, place, and time. No cranial nerve deficit.  Skin: Skin is warm and dry.  Psychiatric: She has a normal mood and affect. Her behavior is normal. Judgment and thought content normal.  Patient does not feel the need for pelvic exam or cultures.  Results for orders placed during the hospital encounter of 07/05/11 (from the past 24 hour(s))  URINALYSIS, ROUTINE W REFLEX MICROSCOPIC     Status: Abnormal   Collection Time   07/05/11 10:25 AM      Component Value Range   Color, Urine YELLOW  YELLOW    APPearance CLEAR  CLEAR    Specific Gravity, Urine 1.020  1.005 - 1.030    pH 6.0  5.0 - 8.0    Glucose, UA NEGATIVE  NEGATIVE (mg/dL)   Hgb urine dipstick LARGE (*) NEGATIVE    Bilirubin Urine NEGATIVE  NEGATIVE    Ketones, ur NEGATIVE  NEGATIVE (mg/dL)   Protein, ur NEGATIVE  NEGATIVE (  mg/dL)   Urobilinogen, UA 1.0  0.0 - 1.0 (mg/dL)   Nitrite NEGATIVE  NEGATIVE    Leukocytes, UA NEGATIVE  NEGATIVE   URINE MICROSCOPIC-ADD ON     Status: Abnormal   Collection Time   07/05/11 10:25 AM      Component Value Range   Squamous Epithelial / LPF FEW (*) RARE    WBC, UA 0-2  <3 (WBC/hpf)   RBC / HPF 3-6  <3 (RBC/hpf)   Bacteria, UA MANY (*) RARE    Urine-Other MUCOUS PRESENT    POCT PREGNANCY, URINE     Status: Normal   Collection Time   07/05/11 12:40 PM      Component  Value Range   Preg Test, Ur NEGATIVE     Assessment: Hemorrhagic cystitis  Plan:  Pyridium    Septra DS  ED Course  Procedures   MDM          Kerrie Buffalo, NP 07/05/11 1331

## 2011-07-05 NOTE — Discharge Instructions (Signed)

## 2011-07-08 MED ORDER — SULFAMETHOXAZOLE-TRIMETHOPRIM 800-160 MG PO TABS: 1.0000 | ORAL_TABLET | Freq: Two times a day (BID) | ORAL | Status: AC

## 2011-08-03 ENCOUNTER — Encounter (HOSPITAL_COMMUNITY): Payer: Self-pay | Admitting: *Deleted

## 2011-08-03 ENCOUNTER — Inpatient Hospital Stay (HOSPITAL_COMMUNITY)
Admission: AD | Admit: 2011-08-03 | Discharge: 2011-08-03 | Disposition: A | Discharge status: Home / Self Care | Payer: Medicaid Other | Source: Ambulatory Visit | Attending: Obstetrics & Gynecology | Admitting: Obstetrics & Gynecology

## 2011-08-03 DIAGNOSIS — N760 Acute vaginitis: Secondary | ICD-10-CM | POA: Insufficient documentation

## 2011-08-03 DIAGNOSIS — A499 Bacterial infection, unspecified: Secondary | ICD-10-CM

## 2011-08-03 DIAGNOSIS — B9689 Other specified bacterial agents as the cause of diseases classified elsewhere: Secondary | ICD-10-CM

## 2011-08-03 LAB — URINE MICROSCOPIC-ADD ON

## 2011-08-03 LAB — WET PREP, GENITAL
Trich, Wet Prep: NONE SEEN
Yeast Wet Prep HPF POC: NONE SEEN

## 2011-08-03 LAB — URINALYSIS, ROUTINE W REFLEX MICROSCOPIC
Bilirubin Urine: NEGATIVE
Glucose, UA: NEGATIVE mg/dL
Ketones, ur: 15 mg/dL — AB
Leukocytes, UA: NEGATIVE
Nitrite: NEGATIVE
Protein, ur: NEGATIVE mg/dL
Specific Gravity, Urine: >1.03 — ABNORMAL HIGH (ref 1.005–1.030)
Urobilinogen, UA: 1 mg/dL (ref 0.0–1.0)
pH: 6 (ref 5.0–8.0)

## 2011-08-03 LAB — POCT PREGNANCY, URINE: Preg Test, Ur: NEGATIVE

## 2011-08-03 MED ORDER — METRONIDAZOLE 500 MG PO TABS: 500.0000 mg | ORAL_TABLET | Freq: Two times a day (BID) | ORAL | Status: AC

## 2011-08-03 NOTE — Progress Notes (Signed)
Pt in c/o bacterial vaginosis, states symptoms started x3 days ago, odor today.  Denies any bleeding.  Reports intermittent abdominal cramping.

## 2011-08-03 NOTE — ED Provider Notes (Signed)
History   Pt presents today c/o having a bacterial vaginitis. She states she gets these infections often and thinks she has another infection. She denies fever but does c/o heavy vag dc with odor. She has no other complaints at this time.  Chief Complaint  Patient presents with  . Vaginal Discharge   HPI  OB History    Grav Para Term Preterm Abortions TAB SAB Ect Mult Living   1 1 1  0 0 0 0 0 0 1      History reviewed. No pertinent past medical history.  History reviewed. No pertinent past surgical history.  History reviewed. No pertinent family history.  History  Substance Use Topics  . Smoking status: Not on file  . Smokeless tobacco: Not on file  . Alcohol Use: Not on file    Allergies: No Known Allergies  No prescriptions prior to admission    Review of Systems  Constitutional: Negative for fever.  Eyes: Negative for blurred vision.  Cardiovascular: Negative for chest pain and palpitations.  Gastrointestinal: Negative for nausea, vomiting, abdominal pain, diarrhea and constipation.  Genitourinary: Negative for dysuria, urgency, frequency and hematuria.  Neurological: Negative for dizziness and headaches.  Psychiatric/Behavioral: Negative for depression and suicidal ideas.   Physical Exam   Blood pressure 113/55, temperature 99.1 F (37.3 C), temperature source Oral, height 5\' 5"  (1.651 m), weight 124 lb 3.2 oz (56.337 kg), last menstrual period 07/29/2011, SpO2 98.00%.  Physical Exam  Nursing note and vitals reviewed. Constitutional: She is oriented to person, place, and time. She appears well-developed and well-nourished. No distress.  HENT:  Head: Normocephalic and atraumatic.  Eyes: EOM are normal. Pupils are equal, round, and reactive to light.  GI: Soft. She exhibits no distension. There is no tenderness. There is no rebound and no guarding.  Genitourinary: No bleeding around the vagina. Vaginal discharge found.       Thin green, watery vag dc present.  Cervix Lg/closed.  Neurological: She is alert and oriented to person, place, and time.  Skin: Skin is warm and dry. She is not diaphoretic.  Psychiatric: She has a normal mood and affect. Her behavior is normal. Judgment and thought content normal.    MAU Course  Procedures  Wet prep and GC/Chlamydia cultures done.  Results for orders placed during the hospital encounter of 08/03/11 (from the past 24 hour(s))  URINALYSIS, ROUTINE W REFLEX MICROSCOPIC     Status: Abnormal   Collection Time   08/03/11  3:10 PM      Component Value Range   Color, Urine YELLOW  YELLOW    APPearance CLEAR  CLEAR    Specific Gravity, Urine >1.030 (*) 1.005 - 1.030    pH 6.0  5.0 - 8.0    Glucose, UA NEGATIVE  NEGATIVE (mg/dL)   Hgb urine dipstick TRACE (*) NEGATIVE    Bilirubin Urine NEGATIVE  NEGATIVE    Ketones, ur 15 (*) NEGATIVE (mg/dL)   Protein, ur NEGATIVE  NEGATIVE (mg/dL)   Urobilinogen, UA 1.0  0.0 - 1.0 (mg/dL)   Nitrite NEGATIVE  NEGATIVE    Leukocytes, UA NEGATIVE  NEGATIVE   URINE MICROSCOPIC-ADD ON     Status: Abnormal   Collection Time   08/03/11  3:10 PM      Component Value Range   Squamous Epithelial / LPF FEW (*) RARE    RBC / HPF 0-2  <3 (RBC/hpf)   Urine-Other MUCOUS PRESENT    POCT PREGNANCY, URINE     Status:  Normal   Collection Time   08/03/11  3:33 PM      Component Value Range   Preg Test, Ur NEGATIVE    WET PREP, GENITAL     Status: Abnormal   Collection Time   08/03/11  5:25 PM      Component Value Range   Yeast, Wet Prep NONE SEEN  NONE SEEN    Trich, Wet Prep NONE SEEN  NONE SEEN    Clue Cells, Wet Prep FEW (*) NONE SEEN    WBC, Wet Prep HPF POC FEW (*) NONE SEEN      Assessment and Plan  BV: discussed with pt at length. Will tx with Flagyl. Warned of antabuse reaction. Discussed diet, activity, risks, and precautions.  Clinton Gallant. Rice III, DrHSc, MPAS, PA-C  08/03/2011, 5:22 PM   Henrietta Hoover, PA 08/03/11 1750

## 2011-08-03 NOTE — Progress Notes (Signed)
Patient states she has a history of recurrent BV. Was treated about 2 weeks ago for a UTI and took all medication but does not feel like it is completely gone. Has some abdominal pain off and on. Has a vaginal discharge with an odor.

## 2011-08-04 LAB — GC/CHLAMYDIA PROBE AMP, GENITAL
Chlamydia, DNA Probe: NEGATIVE
GC Probe Amp, Genital: NEGATIVE

## 2011-08-04 NOTE — ED Provider Notes (Signed)
Attestation of Attending Supervision of Advanced Practitioner: Evaluation and management procedures were performed by the PA/NP/CNM/OB Fellow under my supervision/collaboration. Chart reviewed, and agree with management and plan.  UGONNA ANYANWU, M.D. 08/04/2011 9:24 AM   

## 2011-10-14 ENCOUNTER — Encounter (HOSPITAL_COMMUNITY): Payer: Self-pay | Admitting: *Deleted

## 2011-10-14 ENCOUNTER — Inpatient Hospital Stay (HOSPITAL_COMMUNITY)
Admission: AD | Admit: 2011-10-14 | Discharge: 2011-10-14 | Disposition: A | Discharge status: Home / Self Care | Payer: Medicaid Other | Source: Ambulatory Visit | Attending: Obstetrics and Gynecology | Admitting: Obstetrics and Gynecology

## 2011-10-14 DIAGNOSIS — N949 Unspecified condition associated with female genital organs and menstrual cycle: Secondary | ICD-10-CM | POA: Insufficient documentation

## 2011-10-14 DIAGNOSIS — N898 Other specified noninflammatory disorders of vagina: Secondary | ICD-10-CM

## 2011-10-14 HISTORY — DX: Gestational (pregnancy-induced) hypertension without significant proteinuria, unspecified trimester: O13.9

## 2011-10-14 HISTORY — DX: Anogenital (venereal) warts: A63.0

## 2011-10-14 HISTORY — DX: Urinary tract infection, site not specified: N39.0

## 2011-10-14 LAB — URINALYSIS, ROUTINE W REFLEX MICROSCOPIC
Bilirubin Urine: NEGATIVE
Glucose, UA: NEGATIVE mg/dL
Hgb urine dipstick: NEGATIVE
Ketones, ur: NEGATIVE mg/dL
Leukocytes, UA: NEGATIVE
Nitrite: NEGATIVE
Protein, ur: NEGATIVE mg/dL
Specific Gravity, Urine: 1.025 (ref 1.005–1.030)
Urobilinogen, UA: 0.2 mg/dL (ref 0.0–1.0)
pH: 7 (ref 5.0–8.0)

## 2011-10-14 LAB — POCT PREGNANCY, URINE: Preg Test, Ur: NEGATIVE

## 2011-10-14 LAB — WET PREP, GENITAL
Clue Cells Wet Prep HPF POC: NONE SEEN
Trich, Wet Prep: NONE SEEN
Yeast Wet Prep HPF POC: NONE SEEN

## 2011-10-14 NOTE — MAU Note (Signed)
Lot of discharge, no odor, itching. Some discomfort when pees.

## 2011-10-14 NOTE — MAU Provider Note (Signed)
History   Pt presents today c/o vag dc with irritation for the past several days. She also thinks she has slightly worse sx with urination. Her last episode of intercourse was 2 days ago. She denies vag bleeding. She denies fever. She has reported some mild lower abd cramping.  CSN: [id number]  Arrival date and time: 10/14/11 1439   First Provider Initiated Contact with Patient 10/14/11 1806      No chief complaint on file.  HPI  OB History    Grav Para Term Preterm Abortions TAB SAB Ect Mult Living   1 1 1  0 0 0 0 0 0 1      Past Medical History  Diagnosis Date  . Urinary tract infection   . Genital warts     acid treatment in 2007  . Pregnancy induced hypertension     Past Surgical History  Procedure Date  . Hernia repair     umbilical    Family History  Problem Relation Age of Onset  . Anesthesia problems Neg Hx     History  Substance Use Topics  . Smoking status: Former Games developer  . Smokeless tobacco: Not on file   Comment: in 2011  . Alcohol Use: Yes    Allergies: No Known Allergies  Prescriptions prior to admission  Medication Sig Dispense Refill  . Aspirin-Salicylamide-Caffeine (BC HEADACHE POWDER PO) Take 1 packet by mouth every 6 (six) hours as needed. For headache         Review of Systems  Constitutional: Negative for fever.  Eyes: Negative for blurred vision and double vision.  Cardiovascular: Negative for chest pain and palpitations.  Gastrointestinal: Negative for nausea, vomiting, abdominal pain, diarrhea and constipation.  Genitourinary: Negative for dysuria, urgency, frequency and hematuria.  Neurological: Negative for dizziness and headaches.  Psychiatric/Behavioral: Negative for depression and suicidal ideas.   Physical Exam   Blood pressure 116/69, pulse 64, temperature 97.8 F (36.6 C), temperature source Oral, resp. rate 18, height 5' 5.5" (1.664 m), weight 127 lb (57.607 kg), last menstrual period 10/06/2011.  Physical Exam    Nursing note and vitals reviewed. Constitutional: She is oriented to person, place, and time. She appears well-developed and well-nourished. No distress.  HENT:  Head: Normocephalic and atraumatic.  Eyes: EOM are normal. Pupils are equal, round, and reactive to light.  GI: Soft. She exhibits no distension and no mass. There is no tenderness. There is no rebound and no guarding.  Genitourinary: No bleeding around the vagina. Vaginal discharge found.       Cervix Lg/closed. Uterus Nl size and shape. No adnexal masses. Thin vag dc present in the vault.  Neurological: She is alert and oriented to person, place, and time.  Skin: Skin is warm and dry. She is not diaphoretic.  Psychiatric: She has a normal mood and affect. Her behavior is normal. Judgment and thought content normal.    MAU Course  Procedures  Wet prep and GC/Chlamydia cultures done.  Results for orders placed during the hospital encounter of 10/14/11 (from the past 72 hour(s))  URINALYSIS, ROUTINE W REFLEX MICROSCOPIC     Status: Normal   Collection Time   10/14/11  3:00 PM      Component Value Range Comment   Color, Urine YELLOW  YELLOW     APPearance CLEAR  CLEAR     Specific Gravity, Urine 1.025  1.005 - 1.030     pH 7.0  5.0 - 8.0     Glucose, UA NEGATIVE  NEGATIVE (mg/dL)    Hgb urine dipstick NEGATIVE  NEGATIVE     Bilirubin Urine NEGATIVE  NEGATIVE     Ketones, ur NEGATIVE  NEGATIVE (mg/dL)    Protein, ur NEGATIVE  NEGATIVE (mg/dL)    Urobilinogen, UA 0.2  0.0 - 1.0 (mg/dL)    Nitrite NEGATIVE  NEGATIVE     Leukocytes, UA NEGATIVE  NEGATIVE  MICROSCOPIC NOT DONE ON URINES WITH NEGATIVE PROTEIN, BLOOD, LEUKOCYTES, NITRITE, OR GLUCOSE <1000 mg/dL.  POCT PREGNANCY, URINE     Status: Normal   Collection Time   10/14/11  3:19 PM      Component Value Range Comment   Preg Test, Ur NEGATIVE  NEGATIVE    WET PREP, GENITAL     Status: Abnormal   Collection Time   10/14/11  6:14 PM      Component Value Range Comment    Yeast Wet Prep HPF POC NONE SEEN  NONE SEEN     Trich, Wet Prep NONE SEEN  NONE SEEN     Clue Cells Wet Prep HPF POC NONE SEEN  NONE SEEN     WBC, Wet Prep HPF POC FEW (*) NONE SEEN  MODERATE BACTERIA SEEN     Assessment and Plan  Vag dc: discussed with pt at length. Will await cultures. No treatment needed at this time. Discussed diet, activity, risks, and precautions.  Clinton Gallant. Wilton Thrall III, DrHSc, MPAS, PA-C  10/14/2011, 6:27 PM

## 2011-10-15 LAB — GC/CHLAMYDIA PROBE AMP, GENITAL
Chlamydia, DNA Probe: NEGATIVE
GC Probe Amp, Genital: NEGATIVE

## 2011-10-16 NOTE — MAU Provider Note (Signed)
Agree with above note.  Taylor Bowen 10/16/2011 7:42 AM   

## 2011-11-11 ENCOUNTER — Emergency Department (HOSPITAL_COMMUNITY)
Admission: EM | Admit: 2011-11-11 | Discharge: 2011-11-11 | Disposition: A | Discharge status: Home / Self Care | Payer: Medicaid Other | Source: Home / Self Care | Attending: Emergency Medicine | Admitting: Emergency Medicine

## 2011-11-11 DIAGNOSIS — N390 Urinary tract infection, site not specified: Secondary | ICD-10-CM

## 2011-11-11 LAB — POCT URINALYSIS DIP (DEVICE)
Bilirubin Urine: NEGATIVE
Glucose, UA: NEGATIVE mg/dL
Hgb urine dipstick: NEGATIVE
Ketones, ur: NEGATIVE mg/dL
Nitrite: NEGATIVE
Protein, ur: NEGATIVE mg/dL
Specific Gravity, Urine: 1.025 (ref 1.005–1.030)
Urobilinogen, UA: 1 mg/dL (ref 0.0–1.0)
pH: 6.5 (ref 5.0–8.0)

## 2011-11-11 LAB — POCT PREGNANCY, URINE: Preg Test, Ur: NEGATIVE

## 2011-11-11 MED ORDER — CEPHALEXIN 500 MG PO CAPS: 500.0000 mg | ORAL_CAPSULE | Freq: Three times a day (TID) | ORAL | Status: AC

## 2011-11-11 NOTE — ED Provider Notes (Signed)
Medical screening examination/treatment/procedure(s) were performed by non-physician practitioner and as supervising physician I was immediately available for consultation/collaboration.I have examined the patient and discussed treatment plan as well  Montoya Brandel   Jimmie Molly, MD 11/11/11 1246

## 2011-11-11 NOTE — Discharge Instructions (Signed)

## 2011-11-11 NOTE — ED Provider Notes (Signed)
History     CSN: [id number]  Arrival date & time 11/11/11  1025   First MD Initiated Contact with Patient 11/11/11 1048      Chief Complaint  Patient presents with  . Urinary Tract Infection    (Consider location/radiation/quality/duration/timing/severity/associated sxs/prior treatment) HPI Comments: Began having dysuria and frequency two days ago. Very uncomfortable. No meds tried yet. She is afebrile, no abdominal or back pain, fevers, chills, nausea. Has been treated for UTI previously with last being in December 2012. Recovered with 10 day course of antibiotic. Also treated for BV few months ago. Denies current vaginal symptoms, DC, odor. Last had intercourse last week.   Patient is a 26 y.o. female presenting with urinary tract infection. The history is provided by the patient.  Urinary Tract Infection This is a recurrent problem. The current episode started 2 days ago. The problem has not changed since onset.Pertinent negatives include no abdominal pain. The symptoms are relieved by nothing. She has tried nothing for the symptoms.    Past Medical History  Diagnosis Date  . Urinary tract infection   . Genital warts     acid treatment in 2007  . Pregnancy induced hypertension     Past Surgical History  Procedure Date  . Hernia repair     umbilical    Family History  Problem Relation Age of Onset  . Anesthesia problems Neg Hx     History  Substance Use Topics  . Smoking status: Former Games developer  . Smokeless tobacco: Not on file   Comment: in 2011  . Alcohol Use: Yes    OB History    Grav Para Term Preterm Abortions TAB SAB Ect Mult Living   1 1 1  0 0 0 0 0 0 1      Review of Systems  Constitutional: Negative for fever, activity change and appetite change.  Gastrointestinal: Negative for abdominal pain.  Genitourinary: Negative for hematuria, vaginal bleeding, menstrual problem, pelvic pain and dyspareunia.  Neurological: Negative for dizziness.     Allergies  Review of patient's allergies indicates no known allergies.  Home Medications   Current Outpatient Rx  Name Route Sig Dispense Refill  . BC HEADACHE POWDER PO Oral Take 1 packet by mouth every 6 (six) hours as needed. For headache       BP 103/65  Pulse 63  Temp(Src) 98.5 F (36.9 C) (Oral)  Resp 17  SpO2 99%  LMP 10/25/2011  Physical Exam  Constitutional: She is oriented to person, place, and time. She appears well-developed and well-nourished. No distress.  HENT:  Head: Normocephalic and atraumatic.  Eyes: EOM are normal.  Cardiovascular: Normal rate, regular rhythm and normal heart sounds.   Pulmonary/Chest: Effort normal.  Abdominal: Soft. Bowel sounds are normal. She exhibits no distension. There is no tenderness. There is no rebound and no guarding.       No CVA tenderness.  Neurological: She is alert and oriented to person, place, and time. Coordination normal.  Skin: No rash noted.  Psychiatric: She has a normal mood and affect.    ED Course  Procedures (including critical care time)  Labs Reviewed  POCT URINALYSIS DIP (DEVICE) - Abnormal; Notable for the following:    Leukocytes, UA TRACE (*) Biochemical Testing Only. Please order routine urinalysis from main lab if confirmatory testing is needed.   All other components within normal limits  POCT PREGNANCY, URINE   No results found.   No diagnosis found.    MDM  Will treat empirically for UTI. Acute onset and typical will not send culture. Advised to find PCP so that if infections are recurrent she may benefit from prophylaxis or further investigation. Patient agreeable. Discuss f/u for fever, back pain, nausea, worsening symptoms.         Durwin Reges, MD 11/11/11 [phone]

## 2011-11-11 NOTE — ED Notes (Signed)
PT HERE WITH C/O URINE FREQ/URGE AND PRESSURE/BURN AND ITCH THAT STARTED X 2 DYS AGO.PT HAS BEEN DRINKING CRANBERRY JUICE AND WATER.DENIES FEVER,CHILLS,N,V,D.LMP 10/25/11.DENIES VAG D/C

## 2011-12-03 ENCOUNTER — Encounter (HOSPITAL_COMMUNITY): Payer: Self-pay | Admitting: *Deleted

## 2011-12-03 ENCOUNTER — Emergency Department (HOSPITAL_COMMUNITY)
Admission: EM | Admit: 2011-12-03 | Discharge: 2011-12-03 | Disposition: A | Discharge status: Home / Self Care | Payer: Medicaid Other | Source: Home / Self Care | Attending: Emergency Medicine | Admitting: Emergency Medicine

## 2011-12-03 DIAGNOSIS — N390 Urinary tract infection, site not specified: Secondary | ICD-10-CM

## 2011-12-03 LAB — POCT URINALYSIS DIP (DEVICE)
Bilirubin Urine: NEGATIVE
Glucose, UA: NEGATIVE mg/dL
Ketones, ur: NEGATIVE mg/dL
Nitrite: POSITIVE — AB
Protein, ur: >=300 mg/dL — AB
Specific Gravity, Urine: 1.025 (ref 1.005–1.030)
Urobilinogen, UA: 0.2 mg/dL (ref 0.0–1.0)
pH: 8.5 — ABNORMAL HIGH (ref 5.0–8.0)

## 2011-12-03 LAB — POCT PREGNANCY, URINE: Preg Test, Ur: NEGATIVE

## 2011-12-03 MED ORDER — PHENAZOPYRIDINE HCL 200 MG PO TABS: 200.0000 mg | ORAL_TABLET | Freq: Three times a day (TID) | ORAL | Status: AC | PRN

## 2011-12-03 MED ORDER — NITROFURANTOIN MONOHYD MACRO 100 MG PO CAPS: 100.0000 mg | ORAL_CAPSULE | Freq: Two times a day (BID) | ORAL | Status: AC

## 2011-12-03 NOTE — Discharge Instructions (Signed)

## 2011-12-03 NOTE — ED Notes (Signed)
Pt  Has  Symptoms  Of  Urinary  Frequency         Blood  In urine  As  Well       As  Some  Nausea   And  Cramping      Since  Yesterday  Denies  Any  Vaginal  Discharge  -  She  Reports   She  Has  Had  Frequent uti in past

## 2011-12-03 NOTE — ED Provider Notes (Signed)
History     CSN: [id number]  Arrival date & time 12/03/11  7829   First MD Initiated Contact with Patient 12/03/11 1046      Chief Complaint  Patient presents with  . Urinary Tract Infection    (Consider location/radiation/quality/duration/timing/severity/associated sxs/prior treatment) HPI Comments: Patient presents with new lead develop discomfort and pressure and burning with urination since yesterday. Patient was recently treated about 3 weeks ago for another urinary tract infection. Patient is sexually active and certainly suspect that this is related to Lasix activity. As she's having burning and pressure with urination. Patient denies any nausea vomiting fevers or flank pain  Patient is a 26 y.o. female presenting with urinary tract infection. The history is provided by the patient.  Urinary Tract Infection This is a new problem. The current episode started yesterday. The problem has not changed since onset.Associated symptoms include abdominal pain. Pertinent negatives include no headaches and no shortness of breath. The symptoms are aggravated by nothing. The symptoms are relieved by nothing. She has tried nothing for the symptoms. The treatment provided no relief.    Past Medical History  Diagnosis Date  . Urinary tract infection   . Genital warts     acid treatment in 2007  . Pregnancy induced hypertension     Past Surgical History  Procedure Date  . Hernia repair     umbilical    Family History  Problem Relation Age of Onset  . Anesthesia problems Neg Hx     History  Substance Use Topics  . Smoking status: Former Games developer  . Smokeless tobacco: Not on file   Comment: in 2011  . Alcohol Use: Yes    OB History    Grav Para Term Preterm Abortions TAB SAB Ect Mult Living   1 1 1  0 0 0 0 0 0 1      Review of Systems  Constitutional: Negative for fever and activity change.  Respiratory: Negative for shortness of breath.   Gastrointestinal: Positive for  abdominal pain.  Genitourinary: Positive for dysuria, urgency and frequency. Negative for flank pain, difficulty urinating and dyspareunia.  Musculoskeletal: Negative for back pain.  Neurological: Negative for headaches.    Allergies  Review of patient's allergies indicates no known allergies.  Home Medications   Current Outpatient Rx  Name Route Sig Dispense Refill  . BC HEADACHE POWDER PO Oral Take 1 packet by mouth every 6 (six) hours as needed. For headache     . NITROFURANTOIN MONOHYD MACRO 100 MG PO CAPS Oral Take 1 capsule (100 mg total) by mouth 2 (two) times daily. 20 capsule 0    BP 105/69  Pulse 74  Temp(Src) 98.2 F (36.8 C) (Oral)  Resp 16  SpO2 99%  LMP 11/26/2011  Physical Exam  Constitutional: She appears well-developed and well-nourished.  HENT:  Head: Normocephalic.  Abdominal: Soft. She exhibits no distension and no mass. There is tenderness in the suprapubic area. There is no rebound and no guarding.      ED Course  Procedures (including critical care time)  Labs Reviewed  POCT URINALYSIS DIP (DEVICE) - Abnormal; Notable for the following:    Hgb urine dipstick LARGE (*)    pH 8.5 (*)    Protein, ur >=300 (*)    Nitrite POSITIVE (*)    Leukocytes, UA SMALL (*) Biochemical Testing Only. Please order routine urinalysis from main lab if confirmatory testing is needed.   All other components within normal limits  POCT PREGNANCY,  URINE  URINE CULTURE   No results found.   1. Urinary tract infection       MDM  Uncomplicated urinary tract infection. We have sent since second episode the course of the last 2 months samples for urine culture. Decided to change antibiotic class from last treatment course        Jimmie Molly, MD 12/03/11 1131

## 2011-12-05 LAB — URINE CULTURE
Colony Count: >=100000
Culture  Setup Time: 201305101219

## 2011-12-06 NOTE — ED Notes (Signed)
Urine culture: >100,000 colonies E. Coli.  Pt. adequately treated with Macrobid. Vassie Moselle 12/06/2011

## 2012-02-16 ENCOUNTER — Encounter (HOSPITAL_COMMUNITY): Payer: Self-pay

## 2012-02-16 ENCOUNTER — Inpatient Hospital Stay (HOSPITAL_COMMUNITY)
Admission: AD | Admit: 2012-02-16 | Discharge: 2012-02-16 | Disposition: A | Discharge status: Home / Self Care | Payer: Self-pay | Source: Ambulatory Visit | Attending: Obstetrics & Gynecology | Admitting: Obstetrics & Gynecology

## 2012-02-16 DIAGNOSIS — N949 Unspecified condition associated with female genital organs and menstrual cycle: Secondary | ICD-10-CM | POA: Insufficient documentation

## 2012-02-16 DIAGNOSIS — N760 Acute vaginitis: Secondary | ICD-10-CM | POA: Insufficient documentation

## 2012-02-16 DIAGNOSIS — A499 Bacterial infection, unspecified: Secondary | ICD-10-CM

## 2012-02-16 DIAGNOSIS — B9689 Other specified bacterial agents as the cause of diseases classified elsewhere: Secondary | ICD-10-CM | POA: Insufficient documentation

## 2012-02-16 LAB — URINALYSIS, ROUTINE W REFLEX MICROSCOPIC
Bilirubin Urine: NEGATIVE
Glucose, UA: NEGATIVE mg/dL
Hgb urine dipstick: NEGATIVE
Ketones, ur: NEGATIVE mg/dL
Leukocytes, UA: NEGATIVE
Nitrite: NEGATIVE
Protein, ur: NEGATIVE mg/dL
Specific Gravity, Urine: 1.025 (ref 1.005–1.030)
Urobilinogen, UA: 0.2 mg/dL (ref 0.0–1.0)
pH: 6 (ref 5.0–8.0)

## 2012-02-16 LAB — WET PREP, GENITAL
Trich, Wet Prep: NONE SEEN
Yeast Wet Prep HPF POC: NONE SEEN

## 2012-02-16 LAB — POCT PREGNANCY, URINE: Preg Test, Ur: POSITIVE — AB

## 2012-02-16 MED ORDER — METRONIDAZOLE 500 MG PO TABS: 500.0000 mg | ORAL_TABLET | Freq: Two times a day (BID) | ORAL | Status: AC

## 2012-02-16 NOTE — MAU Provider Note (Signed)
History     CSN: [id number]  Arrival date and time: 02/16/12 1155   First Provider Initiated Contact with Patient 02/16/12 1244      Chief Complaint  Patient presents with  . Vaginal Discharge  . Abdominal Cramping   HPI  Pt is not pregnant and has a vaginal discharge with odor and itching for about 1 week.  She used Monistat last week with some relief but not completely gone away.  The itching resolved but vaginal odor started 3 to 4 days ago.  She uses condoms for contraception.  She used Depo last year.   Past Medical History  Diagnosis Date  . Urinary tract infection   . Genital warts     acid treatment in 2007  . Pregnancy induced hypertension     Past Surgical History  Procedure Date  . Hernia repair     umbilical  . Wisdom tooth extraction     Family History  Problem Relation Age of Onset  . Anesthesia problems Neg Hx   . Other Neg Hx     History  Substance Use Topics  . Smoking status: Former Games developer  . Smokeless tobacco: Never Used   Comment: in 2011  . Alcohol Use: Yes     occasional    Allergies: No Known Allergies  Prescriptions prior to admission  Medication Sig Dispense Refill  . miconazole (MONISTAT 7) 2 % vaginal cream Place 1 applicator vaginally at bedtime.        ROS Physical Exam   Blood pressure 121/67, pulse 72, temperature 98.8 F (37.1 C), temperature source Oral, resp. rate 16, height 5\' 5"  (1.651 m), weight 131 lb 3.2 oz (59.512 kg), last menstrual period 01/11/2012, SpO2 100.00%.  Physical Exam  Nursing note and vitals reviewed. Constitutional: She is oriented to person, place, and time. She appears well-developed and well-nourished.  HENT:  Head: Normocephalic.  Eyes: Pupils are equal, round, and reactive to light.  Neck: Normal range of motion. Neck supple.  Cardiovascular: Normal rate.   Respiratory: Effort normal.  GI: Soft. She exhibits no distension. There is no tenderness. There is no rebound and no guarding.    Genitourinary:       Mod amount of frothy white discharge in vault; cervix clean nontender; adnexal without palpable enlargement or tenderness  Musculoskeletal: Normal range of motion.  Neurological: She is alert and oriented to person, place, and time.  Skin: Skin is warm and dry.  Psychiatric: She has a normal mood and affect.    MAU Course  Procedures Results for orders placed during the hospital encounter of 02/16/12 (from the past 24 hour(s))  URINALYSIS, ROUTINE W REFLEX MICROSCOPIC     Status: Abnormal   Collection Time   02/16/12 12:15 PM      Component Value Range   Color, Urine YELLOW  YELLOW   APPearance HAZY (*) CLEAR   Specific Gravity, Urine 1.025  1.005 - 1.030   pH 6.0  5.0 - 8.0   Glucose, UA NEGATIVE  NEGATIVE mg/dL   Hgb urine dipstick NEGATIVE  NEGATIVE   Bilirubin Urine NEGATIVE  NEGATIVE   Ketones, ur NEGATIVE  NEGATIVE mg/dL   Protein, ur NEGATIVE  NEGATIVE mg/dL   Urobilinogen, UA 0.2  0.0 - 1.0 mg/dL   Nitrite NEGATIVE  NEGATIVE   Leukocytes, UA NEGATIVE  NEGATIVE  POCT PREGNANCY, URINE     Status: Abnormal   Collection Time   02/16/12 12:43 PM      Component Value  Range   Preg Test, Ur POSITIVE (*) NEGATIVE  WET PREP, GENITAL     Status: Abnormal   Collection Time   02/16/12 12:55 PM      Component Value Range   Yeast Wet Prep HPF POC NONE SEEN  NONE SEEN   Trich, Wet Prep NONE SEEN  NONE SEEN   Clue Cells Wet Prep HPF POC FEW (*) NONE SEEN   WBC, Wet Prep HPF POC FEW (*) NONE SEEN    Assessment and Plan  BV-  Flagyl 500mg  BID for 7 days  LINEBERRY,SUSAN 02/16/2012, 12:47 PM

## 2012-02-16 NOTE — MAU Note (Signed)
Patient states she has been having a vaginal discharge with an odor and abdominal cramping. No bleeding.

## 2012-02-16 NOTE — MAU Note (Signed)
Pt states used monistat cream for ? Yeast infection last week. Here today with white creamy vaginal d/c, odorous. Noted x1 week. Notes burning with voiding also, denies urgency or frequency.

## 2012-02-16 NOTE — MAU Provider Note (Signed)
Attestation of Attending Supervision of Advanced Practitioner (CNM/NP): Evaluation and management procedures were performed by the Advanced Practitioner under my supervision and collaboration.  I have reviewed the Advanced Practitioner's note and chart, and I agree with the management and plan.  Jaynie Collins, M.D. 02/16/2012 1:37 PM

## 2012-02-17 LAB — GC/CHLAMYDIA PROBE AMP, GENITAL
Chlamydia, DNA Probe: NEGATIVE
GC Probe Amp, Genital: NEGATIVE

## 2012-02-25 ENCOUNTER — Encounter (HOSPITAL_COMMUNITY): Payer: Self-pay | Admitting: *Deleted

## 2012-02-25 ENCOUNTER — Inpatient Hospital Stay (HOSPITAL_COMMUNITY)
Admission: AD | Admit: 2012-02-25 | Discharge: 2012-02-25 | Disposition: A | Discharge status: Home / Self Care | Payer: Self-pay | Source: Ambulatory Visit | Attending: Obstetrics and Gynecology | Admitting: Obstetrics and Gynecology

## 2012-02-25 DIAGNOSIS — Z3201 Encounter for pregnancy test, result positive: Secondary | ICD-10-CM | POA: Insufficient documentation

## 2012-02-25 LAB — POCT PREGNANCY, URINE: Preg Test, Ur: POSITIVE — AB

## 2012-02-25 NOTE — MAU Provider Note (Signed)
Agree with above note.  Taylor Bowen 02/25/2012 10:46 AM

## 2012-02-25 NOTE — MAU Note (Signed)
Pt took a home pregnancy test and it was positive, was in MAU on 7/24 and was prescribed Metronidazole.  She wanted to make sure of pregnancy test results as well as the safety of the medication during pregnancy.

## 2012-02-25 NOTE — MAU Provider Note (Signed)
  History     CSN: [id number]  Arrival date and time: 02/25/12 0865   First Provider Initiated Contact with Patient 02/25/12 [phone]      Chief Complaint  Patient presents with  . Possible Pregnancy   HPI Taylor Bowen 26 y.o. 6w 3d gestation by LMP 01-11-12.  Was seen on 02-16-12 but was not told her pregnancy test was positive.  Took a home pregnancy test yesterday that was positive and returns today for verification.  No pain.  No bleeding.  OB History    Grav Para Term Preterm Abortions TAB SAB Ect Mult Living   2 1 1  0 0 0 0 0 0 1      Past Medical History  Diagnosis Date  . Urinary tract infection   . Genital warts     acid treatment in 2007  . Pregnancy induced hypertension     Past Surgical History  Procedure Date  . Hernia repair     umbilical  . Wisdom tooth extraction     Family History  Problem Relation Age of Onset  . Anesthesia problems Neg Hx   . Other Neg Hx     History  Substance Use Topics  . Smoking status: Former Games developer  . Smokeless tobacco: Never Used   Comment: in 2011  . Alcohol Use: Yes     occasional    Allergies: No Known Allergies  Prescriptions prior to admission  Medication Sig Dispense Refill  . metroNIDAZOLE (FLAGYL) 500 MG tablet Take 1 tablet (500 mg total) by mouth 2 (two) times daily.  14 tablet  0    ROS Physical Exam   Blood pressure 113/68, pulse 73, temperature 98.8 F (37.1 C), resp. rate 18, height 5\' 6"  (1.676 m), weight 129 lb (58.514 kg), last menstrual period 01/11/2012.  Physical Exam  Nursing note and vitals reviewed. Constitutional: She is oriented to person, place, and time. She appears well-developed and well-nourished.  HENT:  Head: Normocephalic.  Eyes: EOM are normal.  Neck: Neck supple.  Musculoskeletal: Normal range of motion.  Neurological: She is alert and oriented to person, place, and time.  Skin: Skin is warm and dry.  Psychiatric: She has a normal mood and affect.    MAU Course    Procedures  MDM Results for orders placed during the hospital encounter of 02/25/12 (from the past 24 hour(s))  POCT PREGNANCY, URINE     Status: Abnormal   Collection Time   02/25/12  8:45 AM      Component Value Range   Preg Test, Ur POSITIVE (*) NEGATIVE    Assessment and Plan  Positive pregnancy test  Plan Your pregnancy test is positive.  No smoking, no drugs, no alcohol.  Take a prenatal vitamin one by mouth every day.  Eat small frequent snacks to avoid nausea.  Begin prenatal care as soon as possible. Can continue to take metronidazole as prescribed.  BURLESON,TERRI 02/25/2012, 9:47 AM

## 2012-02-25 NOTE — MAU Note (Addendum)
TX in MAU for BV and UPT was neg. Now home UPT posiitve and wants to know if she should continue med.  Feels cramping, no bleeding, sleeping more.  G 1 P 1   26 y/o Looked at last visit results and she was + UPT and patient states no one informed her of pregnancy.  She got care in [facility] clinic for last pregnancy.

## 2012-06-04 ENCOUNTER — Encounter (HOSPITAL_COMMUNITY): Payer: Self-pay

## 2012-06-04 ENCOUNTER — Inpatient Hospital Stay (HOSPITAL_COMMUNITY)
Admission: AD | Admit: 2012-06-04 | Discharge: 2012-06-04 | Disposition: A | Discharge status: Home / Self Care | Payer: Self-pay | Source: Ambulatory Visit | Attending: Obstetrics & Gynecology | Admitting: Obstetrics & Gynecology

## 2012-06-04 DIAGNOSIS — N390 Urinary tract infection, site not specified: Secondary | ICD-10-CM | POA: Insufficient documentation

## 2012-06-04 DIAGNOSIS — N949 Unspecified condition associated with female genital organs and menstrual cycle: Secondary | ICD-10-CM | POA: Insufficient documentation

## 2012-06-04 DIAGNOSIS — R300 Dysuria: Secondary | ICD-10-CM | POA: Insufficient documentation

## 2012-06-04 LAB — URINALYSIS, ROUTINE W REFLEX MICROSCOPIC
Bilirubin Urine: NEGATIVE
Glucose, UA: NEGATIVE mg/dL
Ketones, ur: NEGATIVE mg/dL
Nitrite: POSITIVE — AB
Protein, ur: NEGATIVE mg/dL
Specific Gravity, Urine: >1.03 — ABNORMAL HIGH (ref 1.005–1.030)
Urobilinogen, UA: 0.2 mg/dL (ref 0.0–1.0)
pH: 6 (ref 5.0–8.0)

## 2012-06-04 LAB — URINE MICROSCOPIC-ADD ON

## 2012-06-04 LAB — POCT PREGNANCY, URINE: Preg Test, Ur: NEGATIVE

## 2012-06-04 MED ORDER — CIPROFLOXACIN HCL 500 MG PO TABS: 500.0000 mg | ORAL_TABLET | Freq: Two times a day (BID) | ORAL | Status: DC

## 2012-06-04 NOTE — MAU Note (Signed)
Patient states she has had pain and burning with urination since this am. Has had a vaginal discharge.

## 2012-06-04 NOTE — MAU Provider Note (Signed)
Chief Complaint: Dysuria and Vaginal Discharge   First Provider Initiated Contact with Patient 06/04/12 1310     SUBJECTIVE HPI: Taylor Bowen is a 26 y.o. G2P1011 at  who presents with one-day history of burning on urination, urgency, frequency. Denies hematuria, back pain, fever chills, nausea vomiting. Symptoms are similar to when she has had UTI in the past. She gets frequent UTIs. Last culture here was in May and was Escherichia coli pansensitive. Denies any irritative vaginal discharge. Takes oral contraceptives. Declines STI testing.  Past Medical History  Diagnosis Date  . Urinary tract infection   . Genital warts     acid treatment in 2007  . Pregnancy induced hypertension    OB History    Grav Para Term Preterm Abortions TAB SAB Ect Mult Living   2 1 1  0 0 0 0 0 0 1     # Outc Date GA Lbr Len/2nd Wgt Sex Del Anes PTL Lv   1 TRM 12/08    M SVD EPI No Yes   Comments: elevated BP throughout   2 GRA            Comments: System Generated. Please review and update pregnancy details.    P2: EAB  Past Surgical History  Procedure Date  . Hernia repair     umbilical  . Wisdom tooth extraction    History   Social History  . Marital Status: Single    Spouse Name: N/A    Number of Children: N/A  . Years of Education: N/A   Occupational History  . Not on file.   Social History Main Topics  . Smoking status: Former Games developer  . Smokeless tobacco: Never Used     Comment: in 2011  . Alcohol Use: Yes     Comment: occasional  . Drug Use: No  . Sexually Active: Yes    Birth Control/ Protection: Condom   Other Topics Concern  . Not on file   Social History Narrative  . No narrative on file   No current facility-administered medications on file prior to encounter.   No current outpatient prescriptions on file prior to encounter.   No Known Allergies  ROS: Pertinent items in HPI  OBJECTIVE Blood pressure 128/74, pulse 80, temperature 98.1 F (36.7 C),  temperature source Oral, resp. rate 16, height 5\' 6"  (1.676 m), weight 58.877 kg (129 lb 12.8 oz), last menstrual period 05/22/2012, SpO2 100.00%, unknown if currently breastfeeding. GENERAL: Well-developed, well-nourished female in no acute distress.  HEENT: Normocephalic HEART: normal rate RESP: normal effort ABDOMEN: Soft, non-tender EXTREMITIES: Nontender, no edema BACK: neg CVAT NEURO: Alert and oriented    LAB RESULTS Results for orders placed during the hospital encounter of 06/04/12 (from the past 24 hour(s))  URINALYSIS, ROUTINE W REFLEX MICROSCOPIC     Status: Abnormal   Collection Time   06/04/12  1:00 PM      Component Value Range   Color, Urine YELLOW  YELLOW   APPearance HAZY (*) CLEAR   Specific Gravity, Urine >1.030 (*) 1.005 - 1.030   pH 6.0  5.0 - 8.0   Glucose, UA NEGATIVE  NEGATIVE mg/dL   Hgb urine dipstick MODERATE (*) NEGATIVE   Bilirubin Urine NEGATIVE  NEGATIVE   Ketones, ur NEGATIVE  NEGATIVE mg/dL   Protein, ur NEGATIVE  NEGATIVE mg/dL   Urobilinogen, UA 0.2  0.0 - 1.0 mg/dL   Nitrite POSITIVE (*) NEGATIVE   Leukocytes, UA SMALL (*) NEGATIVE  URINE MICROSCOPIC-ADD  ON     Status: Abnormal   Collection Time   06/04/12  1:00 PM      Component Value Range   Squamous Epithelial / LPF MANY (*) RARE   WBC, UA TOO NUMEROUS TO COUNT  <3 WBC/hpf   RBC / HPF 3-6  <3 RBC/hpf   Bacteria, UA MANY (*) RARE  POCT PREGNANCY, URINE     Status: Normal   Collection Time   06/04/12  1:08 PM      Component Value Range   Preg Test, Ur NEGATIVE  NEGATIVE       ASSESSMENT 1. UTI (lower urinary tract infection)     PLAN Discharge home. AVS on UTI. Discussed preventive measures. Will call if C&S shows organism not sensitive to Cipro.      Medication List     As of 06/04/2012  2:18 PM    TAKE these medications         acetaminophen 500 MG tablet   Commonly known as: TYLENOL   Take 1,000 mg by mouth every 6 (six) hours as needed. pain       ciprofloxacin 500 MG tablet   Commonly known as: CIPRO   Take 1 tablet (500 mg total) by mouth 2 (two) times daily.          Danae Orleans, CNM 06/04/2012  1:11 PM

## 2012-06-04 NOTE — MAU Provider Note (Signed)
Attestation of Attending Supervision of Advanced Practitioner (CNM/NP): Evaluation and management procedures were performed by the Advanced Practitioner under my supervision and collaboration.  I have reviewed the Advanced Practitioner's note and chart, and I agree with the management and plan.  HARRAWAY-SMITH, Vear Staton 3:50 PM     

## 2012-06-04 NOTE — MAU Note (Signed)
Pt presents to MAU with chief complaint of Dysuria, increased urge to urinate. Pt has a significant history of UTI's. Denies abnormal discharge.

## 2012-06-06 LAB — URINE CULTURE: Colony Count: >=100000

## 2012-06-07 ENCOUNTER — Telehealth: Payer: Self-pay | Admitting: Nurse Practitioner

## 2012-06-07 NOTE — Telephone Encounter (Signed)
Patient taking Cipro for UTI. Urine culture resistant to Cipro. Will plan to change to Septra DS. Left message for patient to call NP in MAU.

## 2012-06-07 NOTE — Telephone Encounter (Signed)
Patient returned call. Septra DS 1 tablet po bid x 5 days called in to [facility] [phone]

## 2012-07-23 ENCOUNTER — Encounter (HOSPITAL_COMMUNITY): Payer: Self-pay | Admitting: Obstetrics and Gynecology

## 2012-07-23 ENCOUNTER — Inpatient Hospital Stay (HOSPITAL_COMMUNITY)
Admission: AD | Admit: 2012-07-23 | Discharge: 2012-07-23 | Disposition: A | Discharge status: Home / Self Care | Payer: Self-pay | Source: Ambulatory Visit | Attending: Obstetrics & Gynecology | Admitting: Obstetrics & Gynecology

## 2012-07-23 DIAGNOSIS — R350 Frequency of micturition: Secondary | ICD-10-CM | POA: Insufficient documentation

## 2012-07-23 DIAGNOSIS — R109 Unspecified abdominal pain: Secondary | ICD-10-CM | POA: Insufficient documentation

## 2012-07-23 DIAGNOSIS — N390 Urinary tract infection, site not specified: Secondary | ICD-10-CM | POA: Insufficient documentation

## 2012-07-23 LAB — URINALYSIS, ROUTINE W REFLEX MICROSCOPIC
Glucose, UA: NEGATIVE mg/dL
Ketones, ur: NEGATIVE mg/dL
Nitrite: NEGATIVE
Protein, ur: NEGATIVE mg/dL
Specific Gravity, Urine: >1.03 — ABNORMAL HIGH (ref 1.005–1.030)
Urobilinogen, UA: 0.2 mg/dL (ref 0.0–1.0)
pH: 6 (ref 5.0–8.0)

## 2012-07-23 LAB — URINE MICROSCOPIC-ADD ON

## 2012-07-23 LAB — POCT PREGNANCY, URINE: Preg Test, Ur: NEGATIVE

## 2012-07-23 MED ORDER — SULFAMETHOXAZOLE-TRIMETHOPRIM 800-160 MG PO TABS: 1.0000 | ORAL_TABLET | Freq: Two times a day (BID) | ORAL | Status: AC

## 2012-07-23 NOTE — MAU Provider Note (Signed)
History     CSN: [id number]  Arrival date and time: 07/23/12 1206   First Provider Initiated Contact with Patient 07/23/12 1259      Chief Complaint  Patient presents with  . Urinary Tract Infection   HPI Ms. Britney Mckeon is a 26 y.o. G2 P1011 who presents to MAU today complaining urinary frequency and lower abdominal pain. The patient has frequent bladder infections, the most recent of which was in November. She was originally treated with Cipro and then changed to Septra DS when the urine culture resulted. The patient felt better while on the antibiotic, but feels as though she has had some symptoms for a few weeks. She felt the symptoms got worse last night. She reports irritative voiding symptoms, occasional urgency. Today her pain is 8/10. She denies back pain. She denies N/V/D or fevers.   OB History    Grav Para Term Preterm Abortions TAB SAB Ect Mult Living   2 1 1  0 1 1 0 0 0 1      Past Medical History  Diagnosis Date  . Urinary tract infection   . Genital warts     acid treatment in 2007  . Pregnancy induced hypertension     Past Surgical History  Procedure Date  . Hernia repair     umbilical  . Wisdom tooth extraction     Family History  Problem Relation Age of Onset  . Anesthesia problems Neg Hx   . Other Neg Hx     History  Substance Use Topics  . Smoking status: Former Games developer  . Smokeless tobacco: Never Used     Comment: in 2011  . Alcohol Use: Yes     Comment: occasional    Allergies: No Known Allergies  No prescriptions prior to admission    ROS All negative unless otherwise noted in HPI Physical Exam   Blood pressure 115/66, pulse 84, temperature 98.2 F (36.8 C), temperature source Oral, resp. rate 18, height 5\' 6"  (1.676 m), weight 126 lb 12.8 oz (57.516 kg), last menstrual period 07/19/2012.  Physical Exam  Constitutional: She is oriented to person, place, and time. She appears well-developed and well-nourished. No distress.    HENT:  Head: Normocephalic and atraumatic.  Cardiovascular: Normal rate.   Respiratory: Effort normal.  GI: Soft. She exhibits no distension and no mass. There is no tenderness. There is no rebound and no guarding.  Neurological: She is alert and oriented to person, place, and time.  Skin: Skin is warm and dry. No erythema.  Psychiatric: She has a normal mood and affect.    MAU Course  Procedures None  Results for orders placed during the hospital encounter of 07/23/12 (from the past 24 hour(s))  URINALYSIS, ROUTINE W REFLEX MICROSCOPIC     Status: Abnormal   Collection Time   07/23/12 12:20 PM      Component Value Range   Color, Urine YELLOW  YELLOW   APPearance CLOUDY (*) CLEAR   Specific Gravity, Urine >1.030 (*) 1.005 - 1.030   pH 6.0  5.0 - 8.0   Glucose, UA NEGATIVE  NEGATIVE mg/dL   Hgb urine dipstick LARGE (*) NEGATIVE   Bilirubin Urine SMALL (*) NEGATIVE   Ketones, ur NEGATIVE  NEGATIVE mg/dL   Protein, ur NEGATIVE  NEGATIVE mg/dL   Urobilinogen, UA 0.2  0.0 - 1.0 mg/dL   Nitrite NEGATIVE  NEGATIVE   Leukocytes, UA MODERATE (*) NEGATIVE  URINE MICROSCOPIC-ADD ON     Status: Abnormal  Collection Time   07/23/12 12:20 PM      Component Value Range   Squamous Epithelial / LPF FEW (*) RARE   WBC, UA 21-50  <3 WBC/hpf   RBC / HPF 0-2  <3 RBC/hpf   Bacteria, UA FEW (*) RARE   Urine-Other MUCOUS PRESENT    POCT PREGNANCY, URINE     Status: Normal   Collection Time   07/23/12 12:33 PM      Component Value Range   Preg Test, Ur NEGATIVE  NEGATIVE    Assessment and Plan  A: UTI  P: Discharge home Rx for Septra DS sent to pharmacy Patient encourage to increase PO fluid intake daily, especially with current symptoms Patient cautioned to return to MAU if she develops worsening pain, not controlled by OTC pain medication, or fever Recommended patient find a PCP in the community and establish care as she has frequent UTIs  Freddi Starr, PA-C 07/23/2012, 3:27  PM

## 2012-07-23 NOTE — MAU Note (Signed)
Pt reports she was here a few weeks ago and was treaded for a bladder infection. Symptoms are worse noe having right side pain and whit creamy vaginal discharge(before her period stared)

## 2012-07-23 NOTE — MAU Note (Addendum)
Pt presents to MAU with chief complaint of "UTI symptoms". Pt says she was treated one month ago for a UTI but feels the pain has come back. Patient completed the antibiotic course and says the pain started when that course of antibiotics was complete.. Pt says she gets UTI's frequently. Pt is not pregnant, G2P1

## 2012-07-25 LAB — URINE CULTURE: Colony Count: >=100000

## 2012-07-26 NOTE — L&D Delivery Note (Signed)
Delivery Note At 4:15 AM a viable female was delivered via Vaginal, Spontaneous Delivery (Presentation: Left Occiput Anterior).  APGAR: 9, 9; weight 6 lb 14.9 oz (3144 g).   Placenta status: Intact, Spontaneous.  Cord: 3 vessels with the following complications:  Anesthesia: Epidural  Episiotomy: None Lacerations: None Suture Repair: NA Est. Blood Loss (mL): 300  Mom to postpartum.  Baby to nursery-stable.  Taylor Bowen RYAN 04/24/2013, 5:59 AM

## 2012-09-11 ENCOUNTER — Inpatient Hospital Stay (HOSPITAL_COMMUNITY)
Admission: AD | Admit: 2012-09-11 | Discharge: 2012-09-11 | Disposition: A | Discharge status: Home / Self Care | Payer: Medicaid Other | Source: Ambulatory Visit | Attending: Obstetrics & Gynecology | Admitting: Obstetrics & Gynecology

## 2012-09-11 DIAGNOSIS — Z3201 Encounter for pregnancy test, result positive: Secondary | ICD-10-CM | POA: Insufficient documentation

## 2012-09-11 LAB — POCT PREGNANCY, URINE: Preg Test, Ur: POSITIVE — AB

## 2012-09-11 NOTE — MAU Provider Note (Signed)
  History     CSN: [id number]  Arrival date and time: 09/11/12 4098   None     Chief Complaint  Patient presents with  . Possible Pregnancy   HPI This is a 27 y.o. female at Unknown GA who presents requesting a proof of pregnancy test.  Has no complaints.   OB History   Grav Para Term Preterm Abortions TAB SAB Ect Mult Living   2 1 1  0 1 1 0 0 0 1      Past Medical History  Diagnosis Date  . Urinary tract infection   . Genital warts     acid treatment in 2007  . Pregnancy induced hypertension     Past Surgical History  Procedure Laterality Date  . Hernia repair      umbilical  . Wisdom tooth extraction      Family History  Problem Relation Age of Onset  . Anesthesia problems Neg Hx   . Other Neg Hx     History  Substance Use Topics  . Smoking status: Former Games developer  . Smokeless tobacco: Never Used     Comment: in 2011  . Alcohol Use: Yes     Comment: occasional    Allergies: No Known Allergies  Prescriptions prior to admission  Medication Sig Dispense Refill  . acetaminophen (TYLENOL) 500 MG tablet Take 1,000 mg by mouth every 6 (six) hours as needed. pain        Review of Systems  Constitutional: Negative for fever and chills.  Gastrointestinal: Negative for nausea, vomiting and abdominal pain.  Neurological: Negative for headaches.   Physical Exam   Blood pressure 119/61, pulse 62, temperature 97.9 F (36.6 C), temperature source Oral, resp. rate 18, height 5\' 6"  (1.676 m), weight 126 lb (57.153 kg), last menstrual period 09/14/2011.  Physical Exam  Constitutional: She is oriented to person, place, and time. She appears well-developed and well-nourished. No distress.  Cardiovascular: Normal rate.   Respiratory: Effort normal.  Musculoskeletal: Normal range of motion.  Neurological: She is alert and oriented to person, place, and time.  Skin: Skin is warm and dry.  Psychiatric: She has a normal mood and affect.   Results for orders placed  during the hospital encounter of 09/11/12 (from the past 24 hour(s))  POCT PREGNANCY, URINE     Status: Abnormal   Collection Time    09/11/12  6:59 AM      Result Value Range   Preg Test, Ur POSITIVE (*) NEGATIVE     MAU Course  Procedures  Assessment and Plan  A:  Pregnancy tat 8.4 weeks  P:  Proof of pregnancy letter given      Pt will call clinic for appt (was in HR last preg. Due to Situs Inversus)  Wynelle Bourgeois 09/11/2012, 7:00 AM

## 2012-09-11 NOTE — MAU Note (Signed)
Patient states she wants a pregnancy test. LMP 07/13/2012. Took a home pregnancy test and it was positive. Needs pregnancy verification.

## 2012-10-04 ENCOUNTER — Other Ambulatory Visit (INDEPENDENT_AMBULATORY_CARE_PROVIDER_SITE_OTHER): Payer: Self-pay

## 2012-10-04 DIAGNOSIS — Z3201 Encounter for pregnancy test, result positive: Secondary | ICD-10-CM

## 2012-10-05 LAB — OBSTETRIC PANEL
Antibody Screen: NEGATIVE
Basophils Absolute: 0 10*3/uL (ref 0.0–0.1)
Basophils Relative: 0 % (ref 0–1)
Eosinophils Absolute: 0.1 10*3/uL (ref 0.0–0.7)
Eosinophils Relative: 2 % (ref 0–5)
HCT: 37.5 % (ref 36.0–46.0)
Hemoglobin: 12.5 g/dL (ref 12.0–15.0)
Hepatitis B Surface Ag: NEGATIVE
Lymphocytes Relative: 27 % (ref 12–46)
Lymphs Abs: 1.9 10*3/uL (ref 0.7–4.0)
MCH: 27.8 pg (ref 26.0–34.0)
MCHC: 33.3 g/dL (ref 30.0–36.0)
MCV: 83.5 fL (ref 78.0–100.0)
Monocytes Absolute: 0.6 10*3/uL (ref 0.1–1.0)
Monocytes Relative: 9 % (ref 3–12)
Neutro Abs: 4.2 10*3/uL (ref 1.7–7.7)
Neutrophils Relative %: 62 % (ref 43–77)
Platelets: 266 10*3/uL (ref 150–400)
RBC: 4.49 MIL/uL (ref 3.87–5.11)
RDW: 13.7 % (ref 11.5–15.5)
Rh Type: POSITIVE
Rubella: 14.7 Index — ABNORMAL HIGH (ref <0.90)
WBC: 6.9 10*3/uL (ref 4.0–10.5)

## 2012-10-05 LAB — HIV ANTIBODY (ROUTINE TESTING W REFLEX): HIV: NONREACTIVE

## 2012-10-06 LAB — HEMOGLOBINOPATHY EVALUATION
Hemoglobin Other: 0 %
Hgb A2 Quant: 2.9 % (ref 2.2–3.2)
Hgb A: 97.1 % (ref 96.8–97.8)
Hgb F Quant: 0 % (ref 0.0–2.0)
Hgb S Quant: 0 %

## 2012-10-10 ENCOUNTER — Ambulatory Visit (HOSPITAL_COMMUNITY)
Admission: RE | Admit: 2012-10-10 | Discharge: 2012-10-10 | Disposition: A | Discharge status: Home / Self Care | Payer: Medicaid Other | Source: Ambulatory Visit | Attending: Obstetrics and Gynecology | Admitting: Obstetrics and Gynecology

## 2012-10-10 ENCOUNTER — Encounter (HOSPITAL_COMMUNITY): Payer: Self-pay

## 2012-10-10 DIAGNOSIS — O3410 Maternal care for benign tumor of corpus uteri, unspecified trimester: Secondary | ICD-10-CM | POA: Insufficient documentation

## 2012-10-10 DIAGNOSIS — Z3689 Encounter for other specified antenatal screening: Secondary | ICD-10-CM | POA: Insufficient documentation

## 2012-10-10 DIAGNOSIS — Z3201 Encounter for pregnancy test, result positive: Secondary | ICD-10-CM

## 2012-11-07 ENCOUNTER — Encounter: Payer: Self-pay | Admitting: Obstetrics & Gynecology

## 2012-11-07 ENCOUNTER — Other Ambulatory Visit: Payer: Self-pay | Admitting: Obstetrics & Gynecology

## 2012-11-07 ENCOUNTER — Other Ambulatory Visit (HOSPITAL_COMMUNITY)
Admission: RE | Admit: 2012-11-07 | Discharge: 2012-11-07 | Disposition: A | Discharge status: Home / Self Care | Payer: Medicaid Other | Source: Ambulatory Visit | Attending: Obstetrics & Gynecology | Admitting: Obstetrics & Gynecology

## 2012-11-07 ENCOUNTER — Ambulatory Visit (INDEPENDENT_AMBULATORY_CARE_PROVIDER_SITE_OTHER): Payer: Medicaid Other | Admitting: Obstetrics & Gynecology

## 2012-11-07 VITALS — BP 105/58 | Temp 97.6°F | Wt 134.7 lb

## 2012-11-07 DIAGNOSIS — Z113 Encounter for screening for infections with a predominantly sexual mode of transmission: Secondary | ICD-10-CM | POA: Insufficient documentation

## 2012-11-07 DIAGNOSIS — Z3482 Encounter for supervision of other normal pregnancy, second trimester: Secondary | ICD-10-CM

## 2012-11-07 DIAGNOSIS — Z3480 Encounter for supervision of other normal pregnancy, unspecified trimester: Secondary | ICD-10-CM | POA: Insufficient documentation

## 2012-11-07 DIAGNOSIS — N898 Other specified noninflammatory disorders of vagina: Secondary | ICD-10-CM

## 2012-11-07 DIAGNOSIS — Z01419 Encounter for gynecological examination (general) (routine) without abnormal findings: Secondary | ICD-10-CM | POA: Insufficient documentation

## 2012-11-07 LAB — POCT URINALYSIS DIP (DEVICE)
Bilirubin Urine: NEGATIVE
Glucose, UA: NEGATIVE mg/dL
Ketones, ur: NEGATIVE mg/dL
Nitrite: NEGATIVE
Protein, ur: NEGATIVE mg/dL
Specific Gravity, Urine: 1.025 (ref 1.005–1.030)
Urobilinogen, UA: 0.2 mg/dL (ref 0.0–1.0)
pH: 7 (ref 5.0–8.0)

## 2012-11-07 NOTE — Progress Notes (Signed)
Pulse- 80  Vaginal discharge-clear, no odor Pt c/o hernia protruding out and can be painful Weight gain 25-35lb New ob packet given Declined flu vaccine

## 2012-11-07 NOTE — Patient Instructions (Addendum)
Pregnancy - Second Trimester The second trimester of pregnancy (3 to 6 months) is a period of rapid growth for you and your baby. At the end of the sixth month, your baby is about 9 inches long and weighs 1 1/2 pounds. You will begin to feel the baby move between 18 and 20 weeks of the pregnancy. This is called quickening. Weight gain is faster. A clear fluid (colostrum) may leak out of your breasts. You may feel small contractions of the womb (uterus). This is known as false labor or Braxton-Hicks contractions. This is like a practice for labor when the baby is ready to be born. Usually, the problems with morning sickness have usually passed by the end of your first trimester. Some women develop small dark blotches (called cholasma, mask of pregnancy) on their face that usually goes away after the baby is born. Exposure to the sun makes the blotches worse. Acne may also develop in some pregnant women and pregnant women who have acne, may find that it goes away. PRENATAL EXAMS  Blood work may continue to be done during prenatal exams. These tests are done to check on your health and the probable health of your baby. Blood work is used to follow your blood levels (hemoglobin). Anemia (low hemoglobin) is common during pregnancy. Iron and vitamins are given to help prevent this. You will also be checked for diabetes between 24 and 28 weeks of the pregnancy. Some of the previous blood tests may be repeated.  The size of the uterus is measured during each visit. This is to make sure that the baby is continuing to grow properly according to the dates of the pregnancy.  Your blood pressure is checked every prenatal visit. This is to make sure you are not getting toxemia.  Your urine is checked to make sure you do not have an infection, diabetes or protein in the urine.  Your weight is checked often to make sure gains are happening at the suggested rate. This is to ensure that both you and your baby are growing  normally.  Sometimes, an ultrasound is performed to confirm the proper growth and development of the baby. This is a test which bounces harmless sound waves off the baby so your caregiver can more accurately determine due dates. Sometimes, a specialized test is done on the amniotic fluid surrounding the baby. This test is called an amniocentesis. The amniotic fluid is obtained by sticking a needle into the belly (abdomen). This is done to check the chromosomes in instances where there is a concern about possible genetic problems with the baby. It is also sometimes done near the end of pregnancy if an early delivery is required. In this case, it is done to help make sure the baby's lungs are mature enough for the baby to live outside of the womb. CHANGES OCCURING IN THE SECOND TRIMESTER OF PREGNANCY Your body goes through many changes during pregnancy. They vary from person to person. Talk to your caregiver about changes you notice that you are concerned about.  During the second trimester, you will likely have an increase in your appetite. It is normal to have cravings for certain foods. This varies from person to person and pregnancy to pregnancy.  Your lower abdomen will begin to bulge.  You may have to urinate more often because the uterus and baby are pressing on your bladder. It is also common to get more bladder infections during pregnancy (pain with urination). You can help this by   drinking lots of fluids and emptying your bladder before and after intercourse.  You may begin to get stretch marks on your hips, abdomen, and breasts. These are normal changes in the body during pregnancy. There are no exercises or medications to take that prevent this change.  You may begin to develop swollen and bulging veins (varicose veins) in your legs. Wearing support hose, elevating your feet for 15 minutes, 3 to 4 times a day and limiting salt in your diet helps lessen the problem.  Heartburn may develop  as the uterus grows and pushes up against the stomach. Antacids recommended by your caregiver helps with this problem. Also, eating smaller meals 4 to 5 times a day helps.  Constipation can be treated with a stool softener or adding bulk to your diet. Drinking lots of fluids, vegetables, fruits, and whole grains are helpful.  Exercising is also helpful. If you have been very active up until your pregnancy, most of these activities can be continued during your pregnancy. If you have been less active, it is helpful to start an exercise program such as walking.  Hemorrhoids (varicose veins in the rectum) may develop at the end of the second trimester. Warm sitz baths and hemorrhoid cream recommended by your caregiver helps hemorrhoid problems.  Backaches may develop during this time of your pregnancy. Avoid heavy lifting, wear low heal shoes and practice good posture to help with backache problems.  Some pregnant women develop tingling and numbness of their hand and fingers because of swelling and tightening of ligaments in the wrist (carpel tunnel syndrome). This goes away after the baby is born.  As your breasts enlarge, you may have to get a bigger bra. Get a comfortable, cotton, support bra. Do not get a nursing bra until the last month of the pregnancy if you will be nursing the baby.  You may get a dark line from your belly button to the pubic area called the linea nigra.  You may develop rosy cheeks because of increase blood flow to the face.  You may develop spider looking lines of the face, neck, arms and chest. These go away after the baby is born. HOME CARE INSTRUCTIONS   It is extremely important to avoid all smoking, herbs, alcohol, and unprescribed drugs during your pregnancy. These chemicals affect the formation and growth of the baby. Avoid these chemicals throughout the pregnancy to ensure the delivery of a healthy infant.  Most of your home care instructions are the same as  suggested for the first trimester of your pregnancy. Keep your caregiver's appointments. Follow your caregiver's instructions regarding medication use, exercise and diet.  During pregnancy, you are providing food for you and your baby. Continue to eat regular, well-balanced meals. Choose foods such as meat, fish, milk and other low fat dairy products, vegetables, fruits, and whole-grain breads and cereals. Your caregiver will tell you of the ideal weight gain.  A physical sexual relationship may be continued up until near the end of pregnancy if there are no other problems. Problems could include early (premature) leaking of amniotic fluid from the membranes, vaginal bleeding, abdominal pain, or other medical or pregnancy problems.  Exercise regularly if there are no restrictions. Check with your caregiver if you are unsure of the safety of some of your exercises. The greatest weight gain will occur in the last 2 trimesters of pregnancy. Exercise will help you:  Control your weight.  Get you in shape for labor and delivery.  Lose weight   after you have the baby.  Wear a good support or jogging bra for breast tenderness during pregnancy. This may help if worn during sleep. Pads or tissues may be used in the bra if you are leaking colostrum.  Do not use hot tubs, steam rooms or saunas throughout the pregnancy.  Wear your seat belt at all times when driving. This protects you and your baby if you are in an accident.  Avoid raw meat, uncooked cheese, cat litter boxes and soil used by cats. These carry germs that can cause birth defects in the baby.  The second trimester is also a good time to visit your dentist for your dental health if this has not been done yet. Getting your teeth cleaned is OK. Use a soft toothbrush. Brush gently during pregnancy.  It is easier to loose urine during pregnancy. Tightening up and strengthening the pelvic muscles will help with this problem. Practice stopping your  urination while you are going to the bathroom. These are the same muscles you need to strengthen. It is also the muscles you would use as if you were trying to stop from passing gas. You can practice tightening these muscles up 10 times a set and repeating this about 3 times per day. Once you know what muscles to tighten up, do not perform these exercises during urination. It is more likely to contribute to an infection by backing up the urine.  Ask for help if you have financial, counseling or nutritional needs during pregnancy. Your caregiver will be able to offer counseling for these needs as well as refer you for other special needs.  Your skin may become oily. If so, wash your face with mild soap, use non-greasy moisturizer and oil or cream based makeup. MEDICATIONS AND DRUG USE IN PREGNANCY  Take prenatal vitamins as directed. The vitamin should contain 1 milligram of folic acid. Keep all vitamins out of reach of children. Only a couple vitamins or tablets containing iron may be fatal to a baby or young child when ingested.  Avoid use of all medications, including herbs, over-the-counter medications, not prescribed or suggested by your caregiver. Only take over-the-counter or prescription medicines for pain, discomfort, or fever as directed by your caregiver. Do not use aspirin.  Let your caregiver also know about herbs you may be using.  Alcohol is related to a number of birth defects. This includes fetal alcohol syndrome. All alcohol, in any form, should be avoided completely. Smoking will cause low birth rate and premature babies.  Street or illegal drugs are very harmful to the baby. They are absolutely forbidden. A baby born to an addicted mother will be addicted at birth. The baby will go through the same withdrawal an adult does. SEEK MEDICAL CARE IF:  You have any concerns or worries during your pregnancy. It is better to call with your questions if you feel they cannot wait, rather  than worry about them. SEEK IMMEDIATE MEDICAL CARE IF:   An unexplained oral temperature above 102 F (38.9 C) develops, or as your caregiver suggests.  You have leaking of fluid from the vagina (birth canal). If leaking membranes are suspected, take your temperature and tell your caregiver of this when you call.  There is vaginal spotting, bleeding, or passing clots. Tell your caregiver of the amount and how many pads are used. Light spotting in pregnancy is common, especially following intercourse.  You develop a bad smelling vaginal discharge with a change in the color from clear   to white.  You continue to feel sick to your stomach (nauseated) and have no relief from remedies suggested. You vomit blood or coffee ground-like materials.  You lose more than 2 pounds of weight or gain more than 2 pounds of weight over 1 week, or as suggested by your caregiver.  You notice swelling of your face, hands, feet, or legs.  You get exposed to German measles and have never had them.  You are exposed to fifth disease or chickenpox.  You develop belly (abdominal) pain. Round ligament discomfort is a common non-cancerous (benign) cause of abdominal pain in pregnancy. Your caregiver still must evaluate you.  You develop a bad headache that does not go away.  You develop fever, diarrhea, pain with urination, or shortness of breath.  You develop visual problems, blurry, or double vision.  You fall or are in a car accident or any kind of trauma.  There is mental or physical violence at home. Document Released: 07/06/2001 Document Revised: 10/04/2011 Document Reviewed: 01/08/2009 [employer] Patient Information 2013 [employer].  

## 2012-11-07 NOTE — Progress Notes (Signed)
New OB, states she conceived 07/18/12, but this date is 2 weeks off 12 week [place]. LMP known w/in 3 days. Will use [place] date.  Subjective:    Taylor Bowen is a [id number] [redacted]w[redacted]d being seen today for her first obstetrical visit.  Her obstetrical history is significant for no complication. Patient not sure if she will intend to breast feed. Pregnancy history fully reviewed.  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 Toothache, wants to see dentist Filed Vitals:   11/07/12 0813  BP: 105/58  Temp: 97.6 F (36.4 C)  Weight: 134 lb 11.2 oz (61.1 kg)    HISTORY: OB History   Grav Para Term Preterm Abortions TAB SAB Ect Mult Living   3 1 1  0 1 1 0 0 0 1     # Outc Date GA Lbr Len/2nd Wgt Sex Del Anes PTL Lv   1 TRM 12/08 [redacted]w[redacted]d  6lb1oz(2.75kg) M SVD EPI No Yes   Comments: elevated BP throughout   2 TAB 2013           3 CUR              Past Medical History  Diagnosis Date  . Urinary tract infection   . Genital warts     acid treatment in 2007  . Pregnancy induced hypertension    Past Surgical History  Procedure Laterality Date  . Hernia repair      umbilical  . Wisdom tooth extraction     Family History  Problem Relation Age of Onset  . Anesthesia problems Neg Hx   . Other Neg Hx      Exam    Uterus:     Pelvic Exam:    Perineum: No Hemorrhoids   Vulva: normal   Vagina:  normal mucosa, thin grey discharge, wet prep done   pH:    Cervix: no lesions and bleeding after pap   Adnexa: normal adnexa   Bony Pelvis: average  System: Breast:  normal appearance, no masses or tenderness   Skin: normal coloration and turgor, no rashes    Neurologic: oriented, normal   Extremities: normal strength, tone, and muscle mass   HEENT     Mouth/Teeth mucous membranes moist, pharynx normal without lesions and dental hygiene good   Neck supple   Cardiovascular: regular rate and rhythm   Respiratory:  appears well, vitals normal, no respiratory distress, acyanotic, normal RR, neck  free of mass or lymphadenopathy, chest clear, no wheezing, crepitations, rhonchi, normal symmetric air entry   Abdomen: soft, non-tender; bowel sounds normal; no masses,  no organomegaly  Scars from hernia repair x 2   Urinary: urethral meatus normal      Assessment:    Pregnancy: [id number] There is no problem list on file for this patient.       Plan:     Initial labs drawn. Prenatal vitamins. Problem list reviewed and updated. Genetic Screening discussed Quad Screen: requested.  Ultrasound discussed; fetal survey: requested.  Follow up in 4 weeks. 50% of 30 min visit spent on counseling and coordination of care.  Note for dentist   Sherol Sabas 11/07/2012

## 2012-11-08 LAB — WET PREP, GENITAL
Trich, Wet Prep: NONE SEEN
Yeast Wet Prep HPF POC: NONE SEEN

## 2012-11-08 LAB — CULTURE, OB URINE: Colony Count: >=100000

## 2012-11-09 ENCOUNTER — Encounter: Payer: Self-pay | Admitting: Obstetrics & Gynecology

## 2012-11-16 ENCOUNTER — Encounter: Payer: Self-pay | Admitting: *Deleted

## 2012-12-05 ENCOUNTER — Other Ambulatory Visit: Payer: Self-pay | Admitting: Obstetrics & Gynecology

## 2012-12-05 ENCOUNTER — Ambulatory Visit (INDEPENDENT_AMBULATORY_CARE_PROVIDER_SITE_OTHER): Payer: Medicaid Other | Admitting: Obstetrics & Gynecology

## 2012-12-05 ENCOUNTER — Encounter: Payer: Self-pay | Admitting: Obstetrics & Gynecology

## 2012-12-05 ENCOUNTER — Ambulatory Visit (HOSPITAL_COMMUNITY)
Admission: RE | Admit: 2012-12-05 | Discharge: 2012-12-05 | Disposition: A | Discharge status: Home / Self Care | Payer: Medicaid Other | Source: Ambulatory Visit | Attending: Obstetrics & Gynecology | Admitting: Obstetrics & Gynecology

## 2012-12-05 VITALS — BP 101/62 | Wt 136.8 lb

## 2012-12-05 DIAGNOSIS — O26839 Pregnancy related renal disease, unspecified trimester: Secondary | ICD-10-CM

## 2012-12-05 DIAGNOSIS — Z363 Encounter for antenatal screening for malformations: Secondary | ICD-10-CM | POA: Insufficient documentation

## 2012-12-05 DIAGNOSIS — Z1389 Encounter for screening for other disorder: Secondary | ICD-10-CM | POA: Insufficient documentation

## 2012-12-05 DIAGNOSIS — Z3482 Encounter for supervision of other normal pregnancy, second trimester: Secondary | ICD-10-CM

## 2012-12-05 DIAGNOSIS — O358XX0 Maternal care for other (suspected) fetal abnormality and damage, not applicable or unspecified: Secondary | ICD-10-CM | POA: Insufficient documentation

## 2012-12-05 DIAGNOSIS — O1212 Gestational proteinuria, second trimester: Secondary | ICD-10-CM

## 2012-12-05 DIAGNOSIS — K219 Gastro-esophageal reflux disease without esophagitis: Secondary | ICD-10-CM

## 2012-12-05 DIAGNOSIS — O09299 Supervision of pregnancy with other poor reproductive or obstetric history, unspecified trimester: Secondary | ICD-10-CM | POA: Insufficient documentation

## 2012-12-05 LAB — POCT URINALYSIS DIP (DEVICE)
Bilirubin Urine: NEGATIVE
Glucose, UA: NEGATIVE mg/dL
Ketones, ur: NEGATIVE mg/dL
Nitrite: NEGATIVE
Protein, ur: 100 mg/dL — AB
Specific Gravity, Urine: 1.025 (ref 1.005–1.030)
Urobilinogen, UA: 0.2 mg/dL (ref 0.0–1.0)
pH: 6.5 (ref 5.0–8.0)

## 2012-12-05 MED ORDER — PRENATAL VITAMINS 0.8 MG PO TABS: 1.0000 | ORAL_TABLET | Freq: Every day | ORAL | Status: DC

## 2012-12-05 MED ORDER — LANSOPRAZOLE 30 MG PO CPDR: 30.0000 mg | DELAYED_RELEASE_CAPSULE | Freq: Every day | ORAL | Status: DC

## 2012-12-05 NOTE — Progress Notes (Signed)
Pt with sx of reflux.  Prevacid worked in the past.  Undecided regarding breast feeding.  urrine cx sent

## 2012-12-05 NOTE — Patient Instructions (Signed)
Breastfeeding Deciding to breastfeed is one of the best choices you can make for you and your baby. The information that follows gives a brief overview of the benefits of breastfeeding as well as common topics surrounding breastfeeding. BENEFITS OF BREASTFEEDING For the baby  The first milk (colostrum) helps the baby's digestive system function better.   There are antibodies in the mother's milk that help the baby fight off infections.   The baby has a lower incidence of asthma, allergies, and sudden infant death syndrome (SIDS).   The nutrients in breast milk are better for the baby than infant formulas, and breast milk helps the baby's brain grow better.   Babies who breastfeed have less gas, colic, and constipation.  For the mother  Breastfeeding helps develop a very special bond between the mother and her baby.   Breastfeeding is convenient, always available at the correct temperature, and costs nothing.   Breastfeeding burns calories in the mother and helps her lose weight that was gained during pregnancy.   Breastfeeding makes the uterus contract back down to normal size faster and slows bleeding following delivery.   Breastfeeding mothers have a lower risk of developing breast cancer.  BREASTFEEDING FREQUENCY  A healthy, full-term baby may breastfeed as often as every hour or space his or her feedings to every 3 hours.   Watch your baby for signs of hunger. Nurse your baby if he or she shows signs of hunger. How often you nurse will vary from baby to baby.   Nurse as often as the baby requests, or when you feel the need to reduce the fullness of your breasts.   Awaken the baby if it has been 3 4 hours since the last feeding.   Frequent feeding will help the mother make more milk and will help prevent problems, such as sore nipples and engorgement of the breasts.  BABY'S POSITION AT THE BREAST  Whether lying down or sitting, be sure that the baby's tummy is  facing your tummy.   Support the breast with 4 fingers underneath the breast and the thumb above. Make sure your fingers are well away from the nipple and baby's mouth.   Stroke the baby's lips gently with your finger or nipple.   When the baby's mouth is open wide enough, place all of your nipple and as much of the areola as possible into your baby's mouth.   Pull the baby in close so the tip of the nose and the baby's cheeks touch the breast during the feeding.  FEEDINGS AND SUCTION  The length of each feeding varies from baby to baby and from feeding to feeding.   The baby must suck about 2 3 minutes for your milk to get to him or her. This is called a "let down." For this reason, allow the baby to feed on each breast as long as he or she wants. Your baby will end the feeding when he or she has received the right balance of nutrients.   To break the suction, put your finger into the corner of the baby's mouth and slide it between his or her gums before removing your breast from his or her mouth. This will help prevent sore nipples.  HOW TO TELL WHETHER YOUR BABY IS GETTING ENOUGH BREAST MILK. Wondering whether or not your baby is getting enough milk is a common concern among mothers. You can be assured that your baby is getting enough milk if:   Your baby is actively   sucking and you hear swallowing.   Your baby seems relaxed and satisfied after a feeding.   Your baby nurses at least 8 12 times in a 24 hour time period. Nurse your baby until he or she unlatches or falls asleep at the first breast (at least 10 20 minutes), then offer the second side.   Your baby is wetting 5 6 disposable diapers (6 8 cloth diapers) in a 24 hour period by 5 6 days of age.   Your baby is having at least 3 4 stools every 24 hours for the first 6 weeks. The stool should be soft and yellow.   Your baby should gain 4 7 ounces per week after he or she is 4 days old.   Your breasts feel softer  after nursing.  REDUCING BREAST ENGORGEMENT  In the first week after your baby is born, you may experience signs of breast engorgement. When breasts are engorged, they feel heavy, warm, full, and may be tender to the touch. You can reduce engorgement if you:   Nurse frequently, every 2 3 hours. Mothers who breastfeed early and often have fewer problems with engorgement.   Place light ice packs on your breasts for 10 20 minutes between feedings. This reduces swelling. Wrap the ice packs in a lightweight towel to protect your skin. Bags of frozen vegetables work well for this purpose.   Take a warm shower or apply warm, moist heat to your breast for 5 10 minutes just before each feeding. This increases circulation and helps the milk flow.   Gently massage your breast before and during the feeding. Using your finger tips, massage from the chest wall towards your nipple in a circular motion.   Make sure that the baby empties at least one breast at every feeding before switching sides.   Use a breast pump to empty the breasts if your baby is sleepy or not nursing well. You may also want to pump if you are returning to work oryou feel you are getting engorged.   Avoid bottle feeds, pacifiers, or supplemental feedings of water or juice in place of breastfeeding. Breast milk is all the food your baby needs. It is not necessary for your baby to have water or formula. In fact, to help your breasts make more milk, it is best not to give your baby supplemental feedings during the early weeks.   Be sure the baby is latched on and positioned properly while breastfeeding.   Wear a supportive bra, avoiding underwire styles.   Eat a balanced diet with enough fluids.   Rest often, relax, and take your prenatal vitamins to prevent fatigue, stress, and anemia.  If you follow these suggestions, your engorgement should improve in 24 48 hours. If you are still experiencing difficulty, call your  lactation consultant or caregiver.  CARING FOR YOURSELF Take care of your breasts  Bathe or shower daily.   Avoid using soap on your nipples.   Start feedings on your left breast at one feeding and on your right breast at the next feeding.   You will notice an increase in your milk supply 2 5 days after delivery. You may feel some discomfort from engorgement, which makes your breasts very firm and often tender. Engorgement "peaks" out within 24 48 hours. In the meantime, apply warm moist towels to your breasts for 5 10 minutes before feeding. Gentle massage and expression of some milk before feeding will soften your breasts, making it easier for your   baby to latch on.   Wear a well-fitting nursing bra, and air dry your nipples for a 3 4minutes after each feeding.   Only use cotton bra pads.   Only use pure lanolin on your nipples after nursing. You do not need to wash it off before feeding the baby again. Another option is to express a few drops of breast milk and gently massage it into your nipples.  Take care of yourself  Eat well-balanced meals and nutritious snacks.   Drinking milk, fruit juice, and water to satisfy your thirst (about 8 glasses a day).   Get plenty of rest.  Avoid foods that you notice affect the baby in a bad way.  SEEK MEDICAL CARE IF:   You have difficulty with breastfeeding and need help.   You have a hard, red, sore area on your breast that is accompanied by a fever.   Your baby is too sleepy to eat well or is having trouble sleeping.   Your baby is wetting less than 6 diapers a day, by 5 days of age.   Your baby's skin or white part of his or her eyes is more yellow than it was in the hospital.   You feel depressed.  Document Released: 07/12/2005 Document Revised: 01/11/2012 Document Reviewed: 10/10/2011 [employer] Patient Information 2013 [employer].  

## 2012-12-05 NOTE — Progress Notes (Signed)
[id number] 

## 2012-12-07 LAB — CULTURE, OB URINE: Colony Count: 60000

## 2012-12-08 ENCOUNTER — Other Ambulatory Visit: Payer: Self-pay | Admitting: Obstetrics & Gynecology

## 2012-12-08 DIAGNOSIS — O2343 Unspecified infection of urinary tract in pregnancy, third trimester: Secondary | ICD-10-CM

## 2012-12-08 MED ORDER — AMPICILLIN 500 MG PO CAPS: 500.0000 mg | ORAL_CAPSULE | Freq: Three times a day (TID) | ORAL | Status: AC

## 2012-12-11 ENCOUNTER — Telehealth: Payer: Self-pay

## 2012-12-11 NOTE — Telephone Encounter (Signed)
Called pt and informed pt of results and that an antibiotic has been sent to verified pharmacy. I explained on provider instructions on how to take medication and take it to its completely taken.  Pt stated understanding.

## 2012-12-11 NOTE — Telephone Encounter (Signed)
Message copied by Faythe Casa on Mon Dec 11, 2012  8:50 AM ------      Message from: Willodean Rosenthal      Created: Fri Dec 08, 2012 10:15 AM       Please call pt.  E.coli on cx.  Please treat.            If no allergies Ampicillin 500 tid x 5 days            clh-S  ------

## 2012-12-13 ENCOUNTER — Telehealth: Payer: Self-pay | Admitting: *Deleted

## 2012-12-13 NOTE — Telephone Encounter (Signed)
Patient left a message that she is calling about her prescription. She can be reached at [phone] before 4 pm. And after she can be reached at [phone].

## 2012-12-14 ENCOUNTER — Other Ambulatory Visit: Payer: Self-pay

## 2012-12-14 MED ORDER — OMEPRAZOLE 20 MG PO CPDR: 20.0000 mg | DELAYED_RELEASE_CAPSULE | Freq: Every day | ORAL | Status: DC

## 2012-12-14 NOTE — Telephone Encounter (Signed)
Patient was unsure what she was taking and what it was for.  I explained her medications to her.  She also stated that her pharmacy did not fill her RX for prevacid.  I spoke with "Shanda Bumps" at the [facility] pharmacy who informed me that Ms. Sweeting's insurance doesn't cover prevacid.  I spoke with Dr. Erin Fulling who advised to just take zantac OTC.  I called the patient back and informed her of the situation, she had no other questions but requests we do not call her work number again.

## 2012-12-14 NOTE — Telephone Encounter (Signed)
Called pt and left message that her prescription for acid refluz has been changed to prilosec 20mg  and the medication has been sent to her [facility] on [address].  If she has any questions please give [place] a call.

## 2013-01-02 ENCOUNTER — Ambulatory Visit (INDEPENDENT_AMBULATORY_CARE_PROVIDER_SITE_OTHER): Payer: Medicaid Other | Admitting: Obstetrics & Gynecology

## 2013-01-02 VITALS — BP 114/69 | Temp 99.3°F | Wt 140.9 lb

## 2013-01-02 DIAGNOSIS — O09899 Supervision of other high risk pregnancies, unspecified trimester: Secondary | ICD-10-CM

## 2013-01-02 DIAGNOSIS — Z3480 Encounter for supervision of other normal pregnancy, unspecified trimester: Secondary | ICD-10-CM

## 2013-01-02 DIAGNOSIS — B3731 Acute candidiasis of vulva and vagina: Secondary | ICD-10-CM

## 2013-01-02 DIAGNOSIS — B373 Candidiasis of vulva and vagina: Secondary | ICD-10-CM

## 2013-01-02 LAB — POCT URINALYSIS DIP (DEVICE)
Bilirubin Urine: NEGATIVE
Glucose, UA: NEGATIVE mg/dL
Hgb urine dipstick: NEGATIVE
Ketones, ur: NEGATIVE mg/dL
Nitrite: NEGATIVE
Protein, ur: NEGATIVE mg/dL
Specific Gravity, Urine: 1.02 (ref 1.005–1.030)
Urobilinogen, UA: 0.2 mg/dL (ref 0.0–1.0)
pH: 7.5 (ref 5.0–8.0)

## 2013-01-02 MED ORDER — FLUCONAZOLE 150 MG PO TABS: 150.0000 mg | ORAL_TABLET | Freq: Once | ORAL | Status: DC

## 2013-01-02 NOTE — Progress Notes (Signed)
Pulse- 94   Pressure-lower abd  Vaginal discharge- itchy, white

## 2013-01-02 NOTE — Patient Instructions (Addendum)
Glucose Tolerance Test During Pregnancy The glucose tolerance test (GTT) or 3-hour glucose test can be used to determine if a woman has diabetes that first begins or is first recognized during pregnancy (gestational diabetes). Typically, a GTT is done after you have had a 1-hour glucose test with results that indicate you possibly have gestational diabetes.  The test takes about 3 hours. There will be a series of blood tests after you drink the sugar water solution. You must remain at the testing location to make sure that your blood is drawn on time.  LET YOUR CAREGIVER KNOW ABOUT:  Allergies to food or medicine.  Medicines taken, including vitamins, herbs, eyedrops, over-the-counter medicines, and creams.  Any recent illnesses or infections. BEFORE THE PROCEDURE The GTT is a fasting test, meaning you must stop eating for a certain amount of time. The test will be the most accurate if you have not eaten for 8 12 hours before the test. For this reason, it is recommended that you have this test done in the morning before you have breakfast. PROCEDURE  Do not eat or drink anything but water during the test. When you arrive at the lab, a sample of your blood is taken to get your fasting blood glucose level. After your fasting glucose level is determined, you will be given a sugar water solution to drink. You will be asked to wait in a certain area until your next blood test. The blood tests are done each hour for 3 hours. Stay close to the lab so your blood samples can be taken on time. This is important. If the blood samples are not taken on time, the test will need to be done again on another day.  AFTER THE PROCEDURE  You can eat and drink as usual.   Ask when your test results will be ready. Make sure you get your test results. A positive test is considered when two of the four blood test values are equal or above the normal blood glucose level. Document Released: 01/11/2012 Document Reviewed:  01/11/2012 [facility] Patient Information 2014 [place], [place]. Levonorgestrel intrauterine device (IUD) What is this medicine? LEVONORGESTREL IUD (LEE voe nor jes trel) is a contraceptive (birth control) device. It is used to prevent pregnancy and to treat heavy bleeding that occurs during your period. It can be used for up to 5 years. This medicine may be used for other purposes; ask your health care provider or pharmacist if you have questions. What should I tell my health care provider before I take this medicine? They need to know if you have any of these conditions: -abnormal Pap smear -cancer of the breast, uterus, or cervix -diabetes -endometritis -genital or pelvic infection now or in the past -have more than one sexual partner or your partner has more than one partner -heart disease -history of an ectopic or tubal pregnancy -immune system problems -IUD in place -liver disease or tumor -problems with blood clots or take blood-thinners -use intravenous drugs -uterus of unusual shape -vaginal bleeding that has not been explained -an unusual or allergic reaction to levonorgestrel, other hormones, silicone, or polyethylene, medicines, foods, dyes, or preservatives -pregnant or trying to get pregnant -breast-feeding How should I use this medicine? This device is placed inside the uterus by a health care professional. Talk to your pediatrician regarding the use of this medicine in children. Special care may be needed. Overdosage: If you think you have taken too much of this medicine contact a poison control center or  emergency room at once. NOTE: This medicine is only for you. Do not share this medicine with others. What if I miss a dose? This does not apply. What may interact with this medicine? Do not take this medicine with any of the following medications: -amprenavir -bosentan -fosamprenavir This medicine may also interact with the following  medications: -aprepitant -barbiturate medicines for inducing sleep or treating seizures -bexarotene -griseofulvin -medicines to treat seizures like carbamazepine, ethotoin, felbamate, oxcarbazepine, phenytoin, topiramate -modafinil -pioglitazone -rifabutin -rifampin -rifapentine -some medicines to treat HIV infection like atazanavir, indinavir, lopinavir, nelfinavir, tipranavir, ritonavir -St. John's wort -warfarin This list may not describe all possible interactions. Give your health care provider a list of all the medicines, herbs, non-prescription drugs, or dietary supplements you use. Also tell them if you smoke, drink alcohol, or use illegal drugs. Some items may interact with your medicine. What should I watch for while using this medicine? Visit your doctor or health care professional for regular check ups. See your doctor if you or your partner has sexual contact with others, becomes HIV positive, or gets a sexual transmitted disease. This product does not protect you against HIV infection (AIDS) or other sexually transmitted diseases. You can check the placement of the IUD yourself by reaching up to the top of your vagina with clean fingers to feel the threads. Do not pull on the threads. It is a good habit to check placement after each menstrual period. Call your doctor right away if you feel more of the IUD than just the threads or if you cannot feel the threads at all. The IUD may come out by itself. You may become pregnant if the device comes out. If you notice that the IUD has come out use a backup birth control method like condoms and call your health care provider. Using tampons will not change the position of the IUD and are okay to use during your period. What side effects may I notice from receiving this medicine? Side effects that you should report to your doctor or health care professional as soon as possible: -allergic reactions like skin rash, itching or hives, swelling  of the face, lips, or tongue -fever, flu-like symptoms -genital sores -high blood pressure -no menstrual period for 6 weeks during use -pain, swelling, warmth in the leg -pelvic pain or tenderness -severe or sudden headache -signs of pregnancy -stomach cramping -sudden shortness of breath -trouble with balance, talking, or walking -unusual vaginal bleeding, discharge -yellowing of the eyes or skin Side effects that usually do not require medical attention (report to your doctor or health care professional if they continue or are bothersome): -acne -breast pain -change in sex drive or performance -changes in weight -cramping, dizziness, or faintness while the device is being inserted -headache -irregular menstrual bleeding within first 3 to 6 months of use -nausea This list may not describe all possible side effects. Call your doctor for medical advice about side effects. You may report side effects to FDA at 1-800-FDA-1088. Where should I keep my medicine? This does not apply. NOTE: This sheet is a summary. It may not cover all possible information. If you have questions about this medicine, talk to your doctor, pharmacist, or health care provider.  2012, Elsevier/Gold Standard. (08/02/2008 6:39:08 PM)Etonogestrel implant What is this medicine? ETONOGESTREL is a contraceptive (birth control) device. It is used to prevent pregnancy. It can be used for up to 3 years. This medicine may be used for other purposes; ask your health care provider or  pharmacist if you have questions. What should I tell my health care provider before I take this medicine? They need to know if you have any of these conditions: -abnormal vaginal bleeding -blood vessel disease or blood clots -cancer of the breast, cervix, or liver -depression -diabetes -gallbladder disease -headaches -heart disease or recent heart attack -high blood pressure -high cholesterol -kidney disease -liver disease -renal  disease -seizures -tobacco smoker -an unusual or allergic reaction to etonogestrel, other hormones, anesthetics or antiseptics, medicines, foods, dyes, or preservatives -pregnant or trying to get pregnant -breast-feeding How should I use this medicine? This device is inserted just under the skin on the inner side of your upper arm by a health care professional. Talk to your pediatrician regarding the use of this medicine in children. Special care may be needed. Overdosage: If you think you've taken too much of this medicine contact a poison control center or emergency room at once. Overdosage: If you think you have taken too much of this medicine contact a poison control center or emergency room at once. NOTE: This medicine is only for you. Do not share this medicine with others. What if I miss a dose? This does not apply. What may interact with this medicine? Do not take this medicine with any of the following medications: -amprenavir -bosentan -fosamprenavir This medicine may also interact with the following medications: -barbiturate medicines for inducing sleep or treating seizures -certain medicines for fungal infections like ketoconazole and itraconazole -griseofulvin -medicines to treat seizures like carbamazepine, felbamate, oxcarbazepine, phenytoin, topiramate -modafinil -phenylbutazone -rifampin -some medicines to treat HIV infection like atazanavir, indinavir, lopinavir, nelfinavir, tipranavir, ritonavir -St. John's wort This list may not describe all possible interactions. Give your health care provider a list of all the medicines, herbs, non-prescription drugs, or dietary supplements you use. Also tell them if you smoke, drink alcohol, or use illegal drugs. Some items may interact with your medicine. What should I watch for while using this medicine? This product does not protect you against HIV infection (AIDS) or other sexually transmitted diseases. You should be able to  feel the implant by pressing your fingertips over the skin where it was inserted. Tell your doctor if you cannot feel the implant. What side effects may I notice from receiving this medicine? Side effects that you should report to your doctor or health care professional as soon as possible: -allergic reactions like skin rash, itching or hives, swelling of the face, lips, or tongue -breast lumps -changes in vision -confusion, trouble speaking or understanding -dark urine -depressed mood -general ill feeling or flu-like symptoms -light-colored stools -loss of appetite, nausea -right upper belly pain -severe headaches -severe pain, swelling, or tenderness in the abdomen -shortness of breath, chest pain, swelling in a leg -signs of pregnancy -sudden numbness or weakness of the face, arm or leg -trouble walking, dizziness, loss of balance or coordination -unusual vaginal bleeding, discharge -unusually weak or tired -yellowing of the eyes or skin Side effects that usually do not require medical attention (Report these to your doctor or health care professional if they continue or are bothersome.): -acne -breast pain -changes in weight -cough -fever or chills -headache -irregular menstrual bleeding -itching, burning, and vaginal discharge -pain or difficulty passing urine -sore throat This list may not describe all possible side effects. Call your doctor for medical advice about side effects. You may report side effects to FDA at 1-800-FDA-1088. Where should I keep my medicine? This drug is given in a hospital or clinic and  will not be stored at home. NOTE: This sheet is a summary. It may not cover all possible information. If you have questions about this medicine, talk to your doctor, pharmacist, or health care provider.  2012, Elsevier/Gold Standard. (04/04/2009 3:54:17 PM)

## 2013-01-02 NOTE — Progress Notes (Signed)
Pt with no complaints.  Still undecided on contraception. 28 week labs next visit

## 2013-01-12 ENCOUNTER — Encounter: Payer: Self-pay | Admitting: *Deleted

## 2013-01-12 DIAGNOSIS — O09899 Supervision of other high risk pregnancies, unspecified trimester: Secondary | ICD-10-CM | POA: Insufficient documentation

## 2013-01-30 ENCOUNTER — Ambulatory Visit (INDEPENDENT_AMBULATORY_CARE_PROVIDER_SITE_OTHER): Payer: Medicaid Other | Admitting: Obstetrics & Gynecology

## 2013-01-30 VITALS — BP 119/71 | Temp 97.3°F | Wt 147.0 lb

## 2013-01-30 DIAGNOSIS — K219 Gastro-esophageal reflux disease without esophagitis: Secondary | ICD-10-CM | POA: Insufficient documentation

## 2013-01-30 DIAGNOSIS — O09899 Supervision of other high risk pregnancies, unspecified trimester: Secondary | ICD-10-CM

## 2013-01-30 DIAGNOSIS — Z3483 Encounter for supervision of other normal pregnancy, third trimester: Secondary | ICD-10-CM

## 2013-01-30 LAB — POCT URINALYSIS DIP (DEVICE)
Bilirubin Urine: NEGATIVE
Glucose, UA: 250 mg/dL — AB
Hgb urine dipstick: NEGATIVE
Ketones, ur: NEGATIVE mg/dL
Nitrite: NEGATIVE
Protein, ur: NEGATIVE mg/dL
Specific Gravity, Urine: 1.02 (ref 1.005–1.030)
Urobilinogen, UA: 0.2 mg/dL (ref 0.0–1.0)
pH: 7.5 (ref 5.0–8.0)

## 2013-01-30 LAB — CBC
HCT: 33.1 % — ABNORMAL LOW (ref 36.0–46.0)
Hemoglobin: 11 g/dL — ABNORMAL LOW (ref 12.0–15.0)
MCH: 25.9 pg — ABNORMAL LOW (ref 26.0–34.0)
MCHC: 33.2 g/dL (ref 30.0–36.0)
MCV: 77.9 fL — ABNORMAL LOW (ref 78.0–100.0)
Platelets: 211 10*3/uL (ref 150–400)
RBC: 4.25 MIL/uL (ref 3.87–5.11)
RDW: 13.1 % (ref 11.5–15.5)
WBC: 7.6 10*3/uL (ref 4.0–10.5)

## 2013-01-30 LAB — GLUCOSE TOLERANCE, 1 HOUR (50G) W/O FASTING: Glucose, 1 Hour GTT: 114 mg/dL (ref 70–140)

## 2013-01-30 LAB — RPR

## 2013-01-30 LAB — OB RESULTS CONSOLE GBS: GBS: NEGATIVE

## 2013-01-30 MED ORDER — OMEPRAZOLE 40 MG PO CPDR: 40.0000 mg | DELAYED_RELEASE_CAPSULE | Freq: Every day | ORAL | Status: DC

## 2013-01-30 NOTE — Patient Instructions (Addendum)

## 2013-01-30 NOTE — Progress Notes (Signed)
Pulse: 86

## 2013-01-30 NOTE — Progress Notes (Signed)
Some reflux on Prilosec 20 mg, increase to 40 mg daily.

## 2013-02-01 LAB — CULTURE, OB URINE: Colony Count: >=100000

## 2013-02-16 ENCOUNTER — Inpatient Hospital Stay (HOSPITAL_COMMUNITY)
Admission: AD | Admit: 2013-02-16 | Discharge: 2013-02-16 | Disposition: A | Discharge status: Home / Self Care | Payer: Medicaid Other | Source: Ambulatory Visit | Attending: Obstetrics & Gynecology | Admitting: Obstetrics & Gynecology

## 2013-02-16 ENCOUNTER — Encounter (HOSPITAL_COMMUNITY): Payer: Self-pay | Admitting: *Deleted

## 2013-02-16 DIAGNOSIS — R1031 Right lower quadrant pain: Secondary | ICD-10-CM | POA: Insufficient documentation

## 2013-02-16 DIAGNOSIS — O99891 Other specified diseases and conditions complicating pregnancy: Secondary | ICD-10-CM | POA: Insufficient documentation

## 2013-02-16 DIAGNOSIS — N949 Unspecified condition associated with female genital organs and menstrual cycle: Secondary | ICD-10-CM

## 2013-02-16 DIAGNOSIS — O09899 Supervision of other high risk pregnancies, unspecified trimester: Secondary | ICD-10-CM

## 2013-02-16 LAB — URINALYSIS, ROUTINE W REFLEX MICROSCOPIC
Bilirubin Urine: NEGATIVE
Glucose, UA: NEGATIVE mg/dL
Hgb urine dipstick: NEGATIVE
Ketones, ur: NEGATIVE mg/dL
Leukocytes, UA: NEGATIVE
Nitrite: NEGATIVE
Protein, ur: NEGATIVE mg/dL
Specific Gravity, Urine: 1.025 (ref 1.005–1.030)
Urobilinogen, UA: 0.2 mg/dL (ref 0.0–1.0)
pH: 7 (ref 5.0–8.0)

## 2013-02-16 LAB — WET PREP, GENITAL
Clue Cells Wet Prep HPF POC: NONE SEEN
Trich, Wet Prep: NONE SEEN
Yeast Wet Prep HPF POC: NONE SEEN

## 2013-02-16 MED ORDER — BETAMETHASONE SOD PHOS & ACET 6 (3-3) MG/ML IJ SUSP
12.0000 mg | Freq: Once | INTRAMUSCULAR | Status: DC
  Filled 2013-02-16: qty 2

## 2013-02-16 MED ORDER — NIFEDIPINE 10 MG PO CAPS
20.0000 mg | ORAL_CAPSULE | Freq: Once | ORAL | Status: AC
  Administered 2013-02-16: 20 mg via ORAL
  Filled 2013-02-16: qty 2

## 2013-02-16 NOTE — MAU Provider Note (Cosign Needed)
History    CSN: [id number]  Arrival date and time: 02/16/13 1106   None     Chief Complaint  Patient presents with  . Abdominal Pain  . Pelvic Pain   Abdominal Pain Associated symptoms include dysuria. Pertinent negatives include no constipation, diarrhea, fever, frequency, headaches, hematuria, nausea, vomiting or weight loss.  Pelvic Pain The patient's primary symptoms include pelvic pain. Associated symptoms include abdominal pain, chills (infrequent) and dysuria. Pertinent negatives include no constipation, diarrhea, fever, flank pain, frequency, headaches, hematuria, nausea, urgency or vomiting.   Taylor Bowen is a 27 y.o. G3P1011 at [redacted]w[redacted]d by L=12 presents for eval of R lower abdominal pain and occassional lower abdominal pressure for last 4 days. Describes pain at 7/10 sharp and positional. Has had similar pain prior for 1 day. Otherwise pt is in usual state of health.   +FM, rare uterine tightening. Does not feel painful, no LOF, no VB No HA, vision changes, no RUQ pain, no SOB, CP, cough, n/v, tolerating PO, no increased frequency, mild pain with urination. No back pain. occassional hot flashes (4 during pregnancy)  OB History   Grav Para Term Preterm Abortions TAB SAB Ect Mult Living   3 1 1  0 1 1 0 0 0 1      Past Medical History  Diagnosis Date  . Urinary tract infection   . Genital warts     acid treatment in 2007  . Pregnancy induced hypertension     Past Surgical History  Procedure Laterality Date  . Hernia repair      umbilical  . Wisdom tooth extraction      Family History  Problem Relation Age of Onset  . Anesthesia problems Neg Hx   . Other Neg Hx     History  Substance Use Topics  . Smoking status: Former Smoker    Quit date: 02/07/2011  . Smokeless tobacco: Never Used     Comment: in 2011  . Alcohol Use: Yes     Comment: occasional    Allergies: No Known Allergies  Prescriptions prior to admission  Medication Sig Dispense Refill   . omeprazole (PRILOSEC) 40 MG capsule Take 1 capsule (40 mg total) by mouth daily.  30 capsule  3  . Prenatal Vit-Fe Fumarate-FA (PRENATAL MULTIVITAMIN) TABS Take 1 tablet by mouth daily at 12 noon.        Review of Systems  Constitutional: Positive for chills (infrequent). Negative for fever and weight loss.  Eyes: Negative for blurred vision, double vision, photophobia and pain.  Respiratory: Negative for cough.   Cardiovascular: Negative for chest pain and leg swelling.  Gastrointestinal: Positive for abdominal pain. Negative for nausea, vomiting, diarrhea and constipation.  Genitourinary: Positive for dysuria and pelvic pain. Negative for urgency, frequency, hematuria and flank pain.  Skin: Negative for itching.  Neurological: Negative for headaches.   Physical Exam   Blood pressure 112/68, pulse 81, temperature 98.2 F (36.8 C), temperature source Oral, resp. rate 18, height 5\' 6"  (1.676 m), weight 149 lb (67.586 kg), last menstrual period 07/13/2012.  Physical Exam  Constitutional: She is oriented to person, place, and time. She appears well-developed and well-nourished. No distress.  HENT:  Head: Normocephalic and atraumatic.  Neck: Normal range of motion. Neck supple.  Cardiovascular: Normal rate, regular rhythm and intact distal pulses.  Exam reveals no gallop and no friction rub.   No murmur heard. Respiratory: Effort normal and breath sounds normal. No respiratory distress. She has no wheezes. She  has no rales. She exhibits no tenderness.  GI: Soft. She exhibits no distension and no mass. There is tenderness (tender RLQ, neg rebound). There is no rebound and no guarding.  Genitourinary: Uterus normal. No labial fusion. There is no rash, tenderness, lesion or injury on the right labia. There is no rash, tenderness, lesion or injury on the left labia. No erythema, tenderness or bleeding around the vagina. No foreign body around the vagina. No signs of injury around the vagina.  Vaginal discharge (normal discharge of pregnancy) found.  Musculoskeletal: Normal range of motion. She exhibits no edema and no tenderness.  Neurological: She is alert and oriented to person, place, and time. Cranial nerve deficit: grossly normal.  Skin: Skin is warm and dry. She is not diaphoretic.  Psychiatric: She has a normal mood and affect. Her behavior is normal. Judgment and thought content normal.  SVE: external finger tip/ internal closed/20/-3 SSE: mild white discharge  FHT: 135, mod var, + 10x10 accels, no decels Toco: uterine irritability, possible run for ~2m q70min ctx. Small on monitor,not felt by pt.  MAU Course  Procedures  MDM   Assessment and Plan  Taylor Bowen is a 27 y.o. G3P1011 at [redacted]w[redacted]d by L=12 presents for evaluation of RLQ abd pain. Appears to be most consistent with msk pain 2/2 fetus vs strain. Unlikely appendicitis given afebrile, neg rebound and presentation. Unlikely labor. Eval for BV and UTI neg. Given procardia with improvement.  Initial exam external felt to be 2cm. On repeat internal os closed. No steroids given at this time. Recommend repeat exam on PNV on Tuesday. Pt states udnerstanding. Reviewed return precautions.   Jolyn Lent, RYAN 02/16/2013, 2:12 PM

## 2013-02-16 NOTE — MAU Note (Signed)
Taylor Bowen is here today with complaints of abdominal pain and Pelvic pressure. She is [redacted]w[redacted]d; receives care down in the clinic. The symptoms have been ongoing for 4 days. Denies LOF or bleeding.

## 2013-02-20 ENCOUNTER — Encounter: Payer: Self-pay | Admitting: Obstetrics & Gynecology

## 2013-02-20 ENCOUNTER — Ambulatory Visit (INDEPENDENT_AMBULATORY_CARE_PROVIDER_SITE_OTHER): Payer: Medicaid Other | Admitting: Obstetrics & Gynecology

## 2013-02-20 VITALS — BP 111/71 | Temp 98.4°F | Wt 148.0 lb

## 2013-02-20 DIAGNOSIS — Z3483 Encounter for supervision of other normal pregnancy, third trimester: Secondary | ICD-10-CM

## 2013-02-20 DIAGNOSIS — O09899 Supervision of other high risk pregnancies, unspecified trimester: Secondary | ICD-10-CM

## 2013-02-20 LAB — POCT URINALYSIS DIP (DEVICE)
Bilirubin Urine: NEGATIVE
Glucose, UA: NEGATIVE mg/dL
Hgb urine dipstick: NEGATIVE
Ketones, ur: NEGATIVE mg/dL
Leukocytes, UA: NEGATIVE
Nitrite: NEGATIVE
Protein, ur: 30 mg/dL — AB
Specific Gravity, Urine: >=1.03 (ref 1.005–1.030)
Urobilinogen, UA: 1 mg/dL (ref 0.0–1.0)
pH: 6.5 (ref 5.0–8.0)

## 2013-02-20 NOTE — Progress Notes (Signed)
Routine visit. Good FM. PTL precautions reviewed.

## 2013-02-20 NOTE — Progress Notes (Signed)
P = 84        Pt was seen @ MAU on 7/25- having RLQ pain and UC's -was told to have cx checked at this visit.

## 2013-03-07 ENCOUNTER — Ambulatory Visit (INDEPENDENT_AMBULATORY_CARE_PROVIDER_SITE_OTHER): Payer: Medicaid Other | Admitting: Advanced Practice Midwife

## 2013-03-07 VITALS — BP 103/64 | Wt 149.9 lb

## 2013-03-07 DIAGNOSIS — O09899 Supervision of other high risk pregnancies, unspecified trimester: Secondary | ICD-10-CM

## 2013-03-07 DIAGNOSIS — Z3483 Encounter for supervision of other normal pregnancy, third trimester: Secondary | ICD-10-CM

## 2013-03-07 DIAGNOSIS — K219 Gastro-esophageal reflux disease without esophagitis: Secondary | ICD-10-CM

## 2013-03-07 LAB — POCT URINALYSIS DIP (DEVICE)
Bilirubin Urine: NEGATIVE
Glucose, UA: NEGATIVE mg/dL
Hgb urine dipstick: NEGATIVE
Ketones, ur: NEGATIVE mg/dL
Leukocytes, UA: NEGATIVE
Nitrite: NEGATIVE
Protein, ur: NEGATIVE mg/dL
Specific Gravity, Urine: 1.02 (ref 1.005–1.030)
Urobilinogen, UA: 1 mg/dL (ref 0.0–1.0)
pH: 7 (ref 5.0–8.0)

## 2013-03-07 MED ORDER — PANTOPRAZOLE SODIUM 20 MG PO TBEC: 20.0000 mg | DELAYED_RELEASE_TABLET | Freq: Two times a day (BID) | ORAL | Status: DC

## 2013-03-07 NOTE — Progress Notes (Signed)
Occasional NH contractions. Prilosec is no longer working. Will switch to protonix.

## 2013-03-07 NOTE — Progress Notes (Signed)
Pulse: 79 Prilosec doesn't seem to be helping anymore.

## 2013-03-15 ENCOUNTER — Telehealth: Payer: Self-pay

## 2013-03-15 ENCOUNTER — Telehealth: Payer: Self-pay | Admitting: *Deleted

## 2013-03-15 NOTE — Telephone Encounter (Signed)
Called mobile number left  A message we are returning a call- please call back if you still need assistance and leave a message with name,dob,  phone number and how we may help you

## 2013-03-15 NOTE — Telephone Encounter (Signed)
Pt called but did not leave any information of concerns just stated that she could reached @ [phone] before 2pm.

## 2013-03-15 NOTE — Telephone Encounter (Signed)
Patient called in to clinic to ask about symptoms she is experiencing. She is having trouble with itching all over, shes using creams and lotions but nothing seems to help. She is also complaining of having swollen feet, they go down in the morning but are very bad by the end of the day. She is also having cramps in her calf muscles at night. And the heartburn medicine is not helping at all.  I advised that she stay hydrated to help with the cramping and keep her feet elevated to help with swelling. Scheduled patient for an appointment tomorrow morning to address all of her concerns. She is agreeable to this.

## 2013-03-15 NOTE — Telephone Encounter (Signed)
Called number that was left and heard message reached a number with restrictions.

## 2013-03-16 ENCOUNTER — Ambulatory Visit (INDEPENDENT_AMBULATORY_CARE_PROVIDER_SITE_OTHER): Payer: Medicaid Other | Admitting: Advanced Practice Midwife

## 2013-03-16 VITALS — BP 116/76 | Wt 153.1 lb

## 2013-03-16 DIAGNOSIS — K219 Gastro-esophageal reflux disease without esophagitis: Secondary | ICD-10-CM

## 2013-03-16 DIAGNOSIS — Z3480 Encounter for supervision of other normal pregnancy, unspecified trimester: Secondary | ICD-10-CM

## 2013-03-16 DIAGNOSIS — O09899 Supervision of other high risk pregnancies, unspecified trimester: Secondary | ICD-10-CM

## 2013-03-16 DIAGNOSIS — L509 Urticaria, unspecified: Secondary | ICD-10-CM

## 2013-03-16 LAB — POCT URINALYSIS DIP (DEVICE)
Bilirubin Urine: NEGATIVE
Glucose, UA: NEGATIVE mg/dL
Hgb urine dipstick: NEGATIVE
Ketones, ur: NEGATIVE mg/dL
Leukocytes, UA: NEGATIVE
Nitrite: NEGATIVE
Protein, ur: NEGATIVE mg/dL
Specific Gravity, Urine: 1.025 (ref 1.005–1.030)
Urobilinogen, UA: 0.2 mg/dL (ref 0.0–1.0)
pH: 7 (ref 5.0–8.0)

## 2013-03-16 MED ORDER — T.E.D. BELOW KNEE/M-REGULAR MISC: 1.0000 | Freq: Every day | Status: DC

## 2013-03-16 MED ORDER — RANITIDINE HCL 150 MG PO TABS: 150.0000 mg | ORAL_TABLET | Freq: Two times a day (BID) | ORAL | Status: DC

## 2013-03-16 MED ORDER — HYDROCORTISONE 1 % EX LOTN: TOPICAL_LOTION | Freq: Two times a day (BID) | CUTANEOUS | Status: DC

## 2013-03-16 NOTE — Progress Notes (Signed)
Doing well.  Good fetal movement, denies vaginal bleeding, LOF, regular contractions.  Reports all over body itching x2 weeks, no rash, no change in detergents or exposure to allergens.  Bile acids drawn. Hydrocortisone lotion.  Ankle and feet swelling at work, recommend support socks, elevation.  Heartburn not improved with PPI, prescribe Zantac BID to try for 1 week.  Reevaluate next week.

## 2013-03-16 NOTE — Progress Notes (Signed)
Pulse-  73  Edema-feet during the day shins hurt then worse at home Pt reports itching all over body and the reflux medicine not working

## 2013-03-16 NOTE — Telephone Encounter (Signed)
Pt came for appt scheduled today 03/16/13.  Pt informed me that she had spoken to a nurse and they set her up with this appt.

## 2013-03-17 LAB — BILE ACIDS, TOTAL: Bile Acids Total: 5 umol/L (ref 0–19)

## 2013-03-19 ENCOUNTER — Telehealth: Payer: Self-pay

## 2013-03-19 NOTE — Telephone Encounter (Signed)
Pt called wanting test results.  Called pt and left message that her test results were normal.  If she has any questions to give [place] a call.

## 2013-03-22 ENCOUNTER — Telehealth: Payer: Self-pay | Admitting: Obstetrics and Gynecology

## 2013-03-22 NOTE — Telephone Encounter (Signed)
Patient called wanting a cb from the nurse.

## 2013-03-23 ENCOUNTER — Encounter: Payer: Self-pay | Admitting: Obstetrics and Gynecology

## 2013-03-23 ENCOUNTER — Ambulatory Visit (INDEPENDENT_AMBULATORY_CARE_PROVIDER_SITE_OTHER): Payer: Medicaid Other | Admitting: Obstetrics and Gynecology

## 2013-03-23 VITALS — BP 115/69 | Temp 97.1°F | Wt 153.8 lb

## 2013-03-23 DIAGNOSIS — O09899 Supervision of other high risk pregnancies, unspecified trimester: Secondary | ICD-10-CM

## 2013-03-23 DIAGNOSIS — B373 Candidiasis of vulva and vagina: Secondary | ICD-10-CM

## 2013-03-23 DIAGNOSIS — Z3480 Encounter for supervision of other normal pregnancy, unspecified trimester: Secondary | ICD-10-CM

## 2013-03-23 DIAGNOSIS — B3731 Acute candidiasis of vulva and vagina: Secondary | ICD-10-CM

## 2013-03-23 LAB — POCT URINALYSIS DIP (DEVICE)
Bilirubin Urine: NEGATIVE
Glucose, UA: NEGATIVE mg/dL
Hgb urine dipstick: NEGATIVE
Ketones, ur: NEGATIVE mg/dL
Leukocytes, UA: NEGATIVE
Nitrite: NEGATIVE
Protein, ur: 30 mg/dL — AB
Specific Gravity, Urine: >=1.03 (ref 1.005–1.030)
Urobilinogen, UA: 0.2 mg/dL (ref 0.0–1.0)
pH: 6 (ref 5.0–8.0)

## 2013-03-23 LAB — OB RESULTS CONSOLE GC/CHLAMYDIA
Chlamydia: NEGATIVE
Gonorrhea: NEGATIVE

## 2013-03-23 LAB — OB RESULTS CONSOLE GBS: GBS: POSITIVE

## 2013-03-23 NOTE — Patient Instructions (Signed)
Monilial Vaginitis  Vaginitis in a soreness, swelling and redness (inflammation) of the vagina and vulva. Monilial vaginitis is not a sexually transmitted infection.  CAUSES   Yeast vaginitis is caused by yeast (candida) that is normally found in your vagina. With a yeast infection, the candida has overgrown in number to a point that upsets the chemical balance.  SYMPTOMS   · White, thick vaginal discharge.  · Swelling, itching, redness and irritation of the vagina and possibly the lips of the vagina (vulva).  · Burning or painful urination.  · Painful intercourse.  DIAGNOSIS   Things that may contribute to monilial vaginitis are:  · Postmenopausal and virginal states.  · Pregnancy.  · Infections.  · Being tired, sick or stressed, especially if you had monilial vaginitis in the past.  · Diabetes. Good control will help lower the chance.  · Birth control pills.  · Tight fitting garments.  · Using bubble bath, feminine sprays, douches or deodorant tampons.  · Taking certain medications that kill germs (antibiotics).  · Sporadic recurrence can occur if you become ill.  TREATMENT   Your caregiver will give you medication.  · There are several kinds of anti monilial vaginal creams and suppositories specific for monilial vaginitis. For recurrent yeast infections, use a suppository or cream in the vagina 2 times a week, or as directed.  · Anti-monilial or steroid cream for the itching or irritation of the vulva may also be used. Get your caregiver's permission.  · Painting the vagina with methylene blue solution may help if the monilial cream does not work.  · Eating yogurt may help prevent monilial vaginitis.  HOME CARE INSTRUCTIONS   · Finish all medication as prescribed.  · Do not have sex until treatment is completed or after your caregiver tells you it is okay.  · Take warm sitz baths.  · Do not douche.  · Do not use tampons, especially scented ones.  · Wear cotton underwear.  · Avoid tight pants and panty  hose.  · Tell your sexual partner that you have a yeast infection. They should go to their caregiver if they have symptoms such as mild rash or itching.  · Your sexual partner should be treated as well if your infection is difficult to eliminate.  · Practice safer sex. Use condoms.  · Some vaginal medications cause latex condoms to fail. Vaginal medications that harm condoms are:  · Cleocin cream.  · Butoconazole (Femstat®).  · Terconazole (Terazol®) vaginal suppository.  · Miconazole (Monistat®) (may be purchased over the counter).  SEEK MEDICAL CARE IF:   · You have a temperature by mouth above 102° F (38.9° C).  · The infection is getting worse after 2 days of treatment.  · The infection is not getting better after 3 days of treatment.  · You develop blisters in or around your vagina.  · You develop vaginal bleeding, and it is not your menstrual period.  · You have pain when you urinate.  · You develop intestinal problems.  · You have pain with sexual intercourse.  Document Released: 04/21/2005 Document Revised: 10/04/2011 Document Reviewed: 01/03/2009  [employer]® Patient Information ©2014 [employer].

## 2013-03-23 NOTE — Progress Notes (Addendum)
Spec: thick white d/c and erythema> WP sent. Rx Diflucan. PP out of pelvis>? Cephalic by abd exam, will verify with [place]. GC/CT, GBS sent. Pruritis resolved. Heartburn better.  Per Diane> breech by [place]. F/U here 9/4 and offer version if still breech

## 2013-03-23 NOTE — Progress Notes (Signed)
Pulse- 83 Edema-feet  Pain/pressure-lower abd Vaginal discharge itchy

## 2013-03-23 NOTE — Telephone Encounter (Signed)
Pt came in today for a prenatal appt. Issues address

## 2013-03-24 LAB — GC/CHLAMYDIA PROBE AMP
CT Probe RNA: NEGATIVE
GC Probe RNA: NEGATIVE

## 2013-03-24 LAB — WET PREP, GENITAL
Clue Cells Wet Prep HPF POC: NONE SEEN
Trich, Wet Prep: NONE SEEN
Yeast Wet Prep HPF POC: NONE SEEN

## 2013-03-28 LAB — CULTURE, BETA STREP (GROUP B ONLY)

## 2013-03-29 ENCOUNTER — Encounter: Payer: Self-pay | Admitting: Obstetrics and Gynecology

## 2013-03-29 ENCOUNTER — Telehealth (HOSPITAL_COMMUNITY): Payer: Self-pay | Admitting: *Deleted

## 2013-03-29 ENCOUNTER — Ambulatory Visit (INDEPENDENT_AMBULATORY_CARE_PROVIDER_SITE_OTHER): Payer: Medicaid Other | Admitting: Obstetrics and Gynecology

## 2013-03-29 VITALS — BP 102/66 | Wt 157.1 lb

## 2013-03-29 DIAGNOSIS — Z3480 Encounter for supervision of other normal pregnancy, unspecified trimester: Secondary | ICD-10-CM

## 2013-03-29 DIAGNOSIS — Z3483 Encounter for supervision of other normal pregnancy, third trimester: Secondary | ICD-10-CM

## 2013-03-29 DIAGNOSIS — K219 Gastro-esophageal reflux disease without esophagitis: Secondary | ICD-10-CM

## 2013-03-29 LAB — POCT URINALYSIS DIP (DEVICE)
Bilirubin Urine: NEGATIVE
Glucose, UA: NEGATIVE mg/dL
Hgb urine dipstick: NEGATIVE
Ketones, ur: NEGATIVE mg/dL
Nitrite: NEGATIVE
Protein, ur: NEGATIVE mg/dL
Specific Gravity, Urine: 1.02 (ref 1.005–1.030)
Urobilinogen, UA: 2 mg/dL — ABNORMAL HIGH (ref 0.0–1.0)
pH: 7 (ref 5.0–8.0)

## 2013-03-29 NOTE — Progress Notes (Signed)
Pulse: 67

## 2013-03-29 NOTE — Telephone Encounter (Signed)
Preadmission screen  

## 2013-03-29 NOTE — Progress Notes (Signed)
Informal [place] for presentation- breech.  Dr. Shawnie Pons and Dr, Jolayne Panther notified

## 2013-03-29 NOTE — Progress Notes (Signed)
Patient doing well without complaints. Cervical exam unchanged. Will obtain ultrasound for presentation which demonstrated breech presentation. Patient will be scheduled for ECV on Saturday 9/6 at 7am

## 2013-04-03 ENCOUNTER — Inpatient Hospital Stay (HOSPITAL_COMMUNITY): Admission: RE | Admit: 2013-04-03 | Payer: Medicaid Other | Source: Ambulatory Visit

## 2013-04-05 ENCOUNTER — Inpatient Hospital Stay (HOSPITAL_COMMUNITY)
Admission: AD | Admit: 2013-04-05 | Discharge: 2013-04-05 | Disposition: A | Discharge status: Home / Self Care | Payer: Medicaid Other | Source: Ambulatory Visit | Attending: Obstetrics & Gynecology | Admitting: Obstetrics & Gynecology

## 2013-04-05 ENCOUNTER — Encounter (HOSPITAL_COMMUNITY): Payer: Self-pay | Admitting: *Deleted

## 2013-04-05 ENCOUNTER — Telehealth: Payer: Self-pay | Admitting: *Deleted

## 2013-04-05 DIAGNOSIS — O1203 Gestational edema, third trimester: Secondary | ICD-10-CM

## 2013-04-05 DIAGNOSIS — IMO0002 Reserved for concepts with insufficient information to code with codable children: Secondary | ICD-10-CM

## 2013-04-05 NOTE — MAU Note (Addendum)
PT SAYS  SHE CALLED CLINIC-   TODAY - THIS AM-  THEY CALLED HER BACK AT 3 PM.       SAYS HER FEET AND LEGS  HAVE BEEN SWOLLEN AND FEEL NUMB-  PT WALKED TO TRIAGE WITHOUT DIFFICULT.   SAYS SHE HAS AN APPOINTMENT TOMORROW.     DOESN'T DRINK MUCH WATER.     PT SAYS BABY IS BREECH-  WAS SUPPOSE TO COME Tuesday   FOR A VERSION BUT COULD NOT COME  SO SHE IS COMING TOMORROW FOR  VERSION.

## 2013-04-05 NOTE — MAU Provider Note (Signed)
First Provider Initiated Contact with Patient 04/05/13 2119      Chief Complaint:  No chief complaint on file. Taylor Bowen is  27 y.o. G3P1011 at [redacted]w[redacted]d presents complaining of : PT SAYS SHE CALLED CLINIC- TODAY - THIS AM- THEY CALLED HER BACK AT 3 PM. SAYS HER FEET AND LEGS HAVE BEEN SWOLLEN AND FEEL NUMB- PT WALKED TO TRIAGE WITHOUT DIFFICULT. SAYS SHE HAS AN APPOINTMENT TOMORROW. DOESN'T DRINK MUCH WATER. PT SAYS BABY IS BREECH- WAS SUPPOSE TO COME Tuesday FOR A VERSION BUT COULD NOT COME SO SHE IS COMING TOMORROW FOR VERSION She states that her legs have gone down since resting and are not numb  Obstetrical/Gynecological History: OB History   Grav Para Term Preterm Abortions TAB SAB Ect Mult Living   3 1 1  0 1 1 0 0 0 1     Past Medical History: Past Medical History  Diagnosis Date  . Urinary tract infection   . Genital warts     acid treatment in 2007  . Pregnancy induced hypertension     Past Surgical History: Past Surgical History  Procedure Laterality Date  . Hernia repair      umbilical  . Wisdom tooth extraction      Family History: Family History  Problem Relation Age of Onset  . Anesthesia problems Neg Hx   . Other Neg Hx     Social History: History  Substance Use Topics  . Smoking status: Former Smoker    Quit date: 02/07/2011  . Smokeless tobacco: Never Used     Comment: in 2011  . Alcohol Use: Yes     Comment: occasional    Allergies: No Known Allergies  Meds:  Prescriptions prior to admission  Medication Sig Dispense Refill  . pantoprazole (PROTONIX) 20 MG tablet Take 1 tablet (20 mg total) by mouth 2 (two) times daily.  60 tablet  3  . ranitidine (ZANTAC) 150 MG tablet Take 1 tablet (150 mg total) by mouth 2 (two) times daily.  60 tablet  5    Review of Systems   Constitutional: Negative for fever and chills Eyes: Negative for visual disturbances Respiratory: Negative for shortness of breath, dyspnea Cardiovascular: Negative for  chest pain or palpitations  Gastrointestinal: Negative for vomiting, diarrhea and constipation Genitourinary: Negative for dysuria and urgency Musculoskeletal: Negative for back pain, joint pain, myalgias  Neurological: Negative for dizziness and headaches    Physical Exam  Blood pressure 111/67, pulse 74, temperature 98.2 F (36.8 C), temperature source Oral, resp. rate 20, height 5\' 5"  (1.651 m), weight 73.71 kg (162 lb 8 oz), last menstrual period 07/13/2012, unknown if currently breastfeeding. GENERAL: Well-developed, well-nourished female in no acute distress.  ABDOMEN: Soft, nontender, nondistended, gravid. Feels vertex by leopold's.  Confirmed with bedside u/s with Deeann Saint, MD in attendance EXTREMITIES: Nontender, trace edema, 2+ distal pulses. DTR's 2+ FHT:  Baseline rate 140 bpm   Variability moderate  Accelerations present   Decelerations none Contractions: Every 0 mins   Labs: No results found for this or any previous visit (from the past 24 hour(s)). Imaging Studies:  No results found.  Assessment: Taylor Bowen is  27 y.o. G3P1011 at [redacted]w[redacted]d presents with edema r/t pregnancy, improved with rest.  Plan: Cancel version; advised mat belt. Keep appt in clinc in am   CRESENZO-DISHMAN,Bernadette Gores 9/11/20149:36 PM '

## 2013-04-05 NOTE — Telephone Encounter (Signed)
Patient left a message stating that she is 38 weeks today and is experiencing very swollen feet with numbness. Called patient and left message to go to MAU if she is still having this problem. Pt has appt in clinic tomorrow morning.

## 2013-04-06 ENCOUNTER — Ambulatory Visit (INDEPENDENT_AMBULATORY_CARE_PROVIDER_SITE_OTHER): Payer: Medicaid Other | Admitting: Advanced Practice Midwife

## 2013-04-06 ENCOUNTER — Inpatient Hospital Stay (HOSPITAL_COMMUNITY): Admission: RE | Admit: 2013-04-06 | Payer: Medicaid Other | Source: Ambulatory Visit

## 2013-04-06 VITALS — BP 114/70 | Temp 98.6°F | Wt 157.5 lb

## 2013-04-06 DIAGNOSIS — O99891 Other specified diseases and conditions complicating pregnancy: Secondary | ICD-10-CM

## 2013-04-06 DIAGNOSIS — O99820 Streptococcus B carrier state complicating pregnancy: Secondary | ICD-10-CM | POA: Insufficient documentation

## 2013-04-06 DIAGNOSIS — Z3483 Encounter for supervision of other normal pregnancy, third trimester: Secondary | ICD-10-CM

## 2013-04-06 DIAGNOSIS — Z22330 Carrier of Group B streptococcus: Secondary | ICD-10-CM

## 2013-04-06 LAB — POCT URINALYSIS DIP (DEVICE)
Bilirubin Urine: NEGATIVE
Glucose, UA: NEGATIVE mg/dL
Hgb urine dipstick: NEGATIVE
Ketones, ur: NEGATIVE mg/dL
Leukocytes, UA: NEGATIVE
Nitrite: NEGATIVE
Protein, ur: 30 mg/dL — AB
Specific Gravity, Urine: >=1.03 (ref 1.005–1.030)
Urobilinogen, UA: 0.2 mg/dL (ref 0.0–1.0)
pH: 6.5 (ref 5.0–8.0)

## 2013-04-06 NOTE — Progress Notes (Signed)
Missed ECV 03/31/2013, but vts verified and MAU visit 9/11 by [place] and today by Leopold's. GBS pos. Discussed PCN in labor.

## 2013-04-06 NOTE — Progress Notes (Signed)
Pulse- 77   Pain/pressure-lower abd

## 2013-04-06 NOTE — Patient Instructions (Signed)
Braxton Hicks Contractions °Pregnancy is commonly associated with contractions of the uterus throughout the pregnancy. Towards the end of pregnancy (32 to 34 weeks), these contractions (Braxton Hicks) can develop more often and may become more forceful. This is not true labor because these contractions do not result in opening (dilatation) and thinning of the cervix. They are sometimes difficult to tell apart from true labor because these contractions can be forceful and people have different pain tolerances. You should not feel embarrassed if you go to the hospital with false labor. Sometimes, the only way to tell if you are in true labor is for your caregiver to follow the changes in the cervix. °How to tell the difference between true and false labor: °· False labor. °· The contractions of false labor are usually shorter, irregular and not as hard as those of true labor. °· They are often felt in the front of the lower abdomen and in the groin. °· They may leave with walking around or changing positions while lying down. °· They get weaker and are shorter lasting as time goes on. °· These contractions are usually irregular. °· They do not usually become progressively stronger, regular and closer together as with true labor. °· True labor. °· Contractions in true labor last 30 to 70 seconds, become very regular, usually become more intense, and increase in frequency. °· They do not go away with walking. °· The discomfort is usually felt in the top of the uterus and spreads to the lower abdomen and low back. °· True labor can be determined by your caregiver with an exam. This will show that the cervix is dilating and getting thinner. °If there are no prenatal problems or other health problems associated with the pregnancy, it is completely safe to be sent home with false labor and await the onset of true labor. °HOME CARE INSTRUCTIONS  °· Keep up with your usual exercises and instructions. °· Take medications as  directed. °· Keep your regular prenatal appointment. °· Eat and drink lightly if you think you are going into labor. °· If BH contractions are making you uncomfortable: °· Change your activity position from lying down or resting to walking/walking to resting. °· Sit and rest in a tub of warm water. °· Drink 2 to 3 glasses of water. Dehydration may cause B-H contractions. °· Do slow and deep breathing several times an hour. °SEEK IMMEDIATE MEDICAL CARE IF:  °· Your contractions continue to become stronger, more regular, and closer together. °· You have a gushing, burst or leaking of fluid from the vagina. °· An oral temperature above 102° F (38.9° C) develops. °· You have passage of blood-tinged mucus. °· You develop vaginal bleeding. °· You develop continuous belly (abdominal) pain. °· You have low back pain that you never had before. °· You feel the baby's head pushing down causing pelvic pressure. °· The baby is not moving as much as it used to. °Document Released: 07/12/2005 Document Revised: 10/04/2011 Document Reviewed: 01/03/2009 °Exit[employer]® Patient Information ©2014 [employer]. ° ° °Fetal Movement Counts °Patient Name: __________________________________________________ Patient Due Date: ____________________ °Performing a fetal movement count is highly recommended in high-risk pregnancies, but it is good for every pregnant woman to do. Your caregiver may ask you to start counting fetal movements at 28 weeks of the pregnancy. Fetal movements often increase: °· After eating a full meal. °· After physical activity. °· After eating or drinking something sweet or cold. °· At rest. °Pay attention to when you   the baby is most active. This will help you notice a pattern of your baby's sleep and wake cycles and what factors contribute to an increase in fetal movement. It is important to perform a fetal movement count at the same time each day when your baby is normally most active.  HOW TO COUNT FETAL  MOVEMENTS 1. Find a quiet and comfortable area to sit or lie down on your left side. Lying on your left side provides the best blood and oxygen circulation to your baby. 2. Write down the day and time on a sheet of paper or in a journal. 3. Start counting kicks, flutters, swishes, rolls, or jabs in a 2 hour period. You should feel at least 10 movements within 2 hours. 4. If you do not feel 10 movements in 2 hours, wait 2 3 hours and count again. Look for a change in the pattern or not enough counts in 2 hours. SEEK MEDICAL CARE IF:  You feel less than 10 counts in 2 hours, tried twice.  There is no movement in over an hour.  The pattern is changing or taking longer each day to reach 10 counts in 2 hours.  You feel the baby is not moving as he or she usually does. Date: ____________ Movements: ____________ Start time: ____________ Finish time: ____________  Date: ____________ Movements: ____________ Start time: ____________ Finish time: ____________ Date: ____________ Movements: ____________ Start time: ____________ Finish time: ____________ Date: ____________ Movements: ____________ Start time: ____________ Finish time: ____________ Date: ____________ Movements: ____________ Start time: ____________ Finish time: ____________ Date: ____________ Movements: ____________ Start time: ____________ Finish time: ____________ Date: ____________ Movements: ____________ Start time: ____________ Finish time: ____________ Date: ____________ Movements: ____________ Start time: ____________ Finish time: ____________  Date: ____________ Movements: ____________ Start time: ____________ Finish time: ____________ Date: ____________ Movements: ____________ Start time: ____________ Finish time: ____________ Date: ____________ Movements: ____________ Start time: ____________ Finish time: ____________ Date: ____________ Movements: ____________ Start time: ____________ Finish time: ____________ Date: ____________  Movements: ____________ Start time: ____________ Finish time: ____________ Date: ____________ Movements: ____________ Start time: ____________ Finish time: ____________ Date: ____________ Movements: ____________ Start time: ____________ Finish time: ____________  Date: ____________ Movements: ____________ Start time: ____________ Finish time: ____________ Date: ____________ Movements: ____________ Start time: ____________ Finish time: ____________ Date: ____________ Movements: ____________ Start time: ____________ Finish time: ____________ Date: ____________ Movements: ____________ Start time: ____________ Finish time: ____________ Date: ____________ Movements: ____________ Start time: ____________ Finish time: ____________ Date: ____________ Movements: ____________ Start time: ____________ Finish time: ____________ Date: ____________ Movements: ____________ Start time: ____________ Finish time: ____________  Date: ____________ Movements: ____________ Start time: ____________ Finish time: ____________ Date: ____________ Movements: ____________ Start time: ____________ Finish time: ____________ Date: ____________ Movements: ____________ Start time: ____________ Finish time: ____________ Date: ____________ Movements: ____________ Start time: ____________ Finish time: ____________ Date: ____________ Movements: ____________ Start time: ____________ Finish time: ____________ Date: ____________ Movements: ____________ Start time: ____________ Finish time: ____________ Date: ____________ Movements: ____________ Start time: ____________ Finish time: ____________  Date: ____________ Movements: ____________ Start time: ____________ Finish time: ____________ Date: ____________ Movements: ____________ Start time: ____________ Finish time: ____________ Date: ____________ Movements: ____________ Start time: ____________ Finish time: ____________ Date: ____________ Movements: ____________ Start time:  ____________ Finish time: ____________ Date: ____________ Movements: ____________ Start time: ____________ Finish time: ____________ Date: ____________ Movements: ____________ Start time: ____________ Finish time: ____________ Date: ____________ Movements: ____________ Start time: ____________ Finish time: ____________  Date: ____________ Movements: ____________ Start time: ____________ Finish time: ____________ Date: ____________ Movements: ____________ Start   time: ____________ Finish time: ____________ Date: ____________ Movements: ____________ Start time: ____________ Finish time: ____________ Date: ____________ Movements: ____________ Start time: ____________ Finish time: ____________ Date: ____________ Movements: ____________ Start time: ____________ Finish time: ____________ Date: ____________ Movements: ____________ Start time: ____________ Finish time: ____________ Date: ____________ Movements: ____________ Start time: ____________ Finish time: ____________  Date: ____________ Movements: ____________ Start time: ____________ Finish time: ____________ Date: ____________ Movements: ____________ Start time: ____________ Finish time: ____________ Date: ____________ Movements: ____________ Start time: ____________ Finish time: ____________ Date: ____________ Movements: ____________ Start time: ____________ Finish time: ____________ Date: ____________ Movements: ____________ Start time: ____________ Finish time: ____________ Date: ____________ Movements: ____________ Start time: ____________ Finish time: ____________ Date: ____________ Movements: ____________ Start time: ____________ Finish time: ____________  Date: ____________ Movements: ____________ Start time: ____________ Finish time: ____________ Date: ____________ Movements: ____________ Start time: ____________ Finish time: ____________ Date: ____________ Movements: ____________ Start time: ____________ Finish time: ____________ Date:  ____________ Movements: ____________ Start time: ____________ Finish time: ____________ Date: ____________ Movements: ____________ Start time: ____________ Finish time: ____________ Date: ____________ Movements: ____________ Start time: ____________ Finish time: ____________ Document Released: 08/11/2006 Document Revised: 06/28/2012 Document Reviewed: 05/08/2012 [employer] Patient Information 2014 [employer].  

## 2013-04-09 NOTE — MAU Provider Note (Signed)
Attestation of Attending Supervision of Advanced Practitioner (CNM/NP): Evaluation and management procedures were performed by the Advanced Practitioner under my supervision and collaboration. I have reviewed the Advanced Practitioner's note and chart, and I agree with the management and plan.  Scanned pt with midwife and agree it is vertex.  Helaine Yackel H. 4:37 PM

## 2013-04-11 ENCOUNTER — Encounter: Payer: Self-pay | Admitting: Obstetrics and Gynecology

## 2013-04-11 ENCOUNTER — Ambulatory Visit (INDEPENDENT_AMBULATORY_CARE_PROVIDER_SITE_OTHER): Payer: Medicaid Other | Admitting: Obstetrics and Gynecology

## 2013-04-11 VITALS — BP 102/71 | Wt 157.6 lb

## 2013-04-11 DIAGNOSIS — IMO0001 Reserved for inherently not codable concepts without codable children: Secondary | ICD-10-CM

## 2013-04-11 DIAGNOSIS — IMO0002 Reserved for concepts with insufficient information to code with codable children: Secondary | ICD-10-CM

## 2013-04-11 DIAGNOSIS — Z3483 Encounter for supervision of other normal pregnancy, third trimester: Secondary | ICD-10-CM

## 2013-04-11 DIAGNOSIS — O99891 Other specified diseases and conditions complicating pregnancy: Secondary | ICD-10-CM

## 2013-04-11 LAB — POCT URINALYSIS DIP (DEVICE)
Bilirubin Urine: NEGATIVE
Glucose, UA: NEGATIVE mg/dL
Hgb urine dipstick: NEGATIVE
Ketones, ur: 15 mg/dL — AB
Leukocytes, UA: NEGATIVE
Nitrite: NEGATIVE
Protein, ur: NEGATIVE mg/dL
Specific Gravity, Urine: 1.02 (ref 1.005–1.030)
Urobilinogen, UA: 0.2 mg/dL (ref 0.0–1.0)
pH: 7 (ref 5.0–8.0)

## 2013-04-11 NOTE — Patient Instructions (Signed)
Fetal Movement Counts Patient Name: __________________________________________________ Patient Due Date: ____________________ Performing a fetal movement count is highly recommended in high-risk pregnancies, but it is good for every pregnant woman to do. Your caregiver may ask you to start counting fetal movements at 28 weeks of the pregnancy. Fetal movements often increase:  After eating a full meal.  After physical activity.  After eating or drinking something sweet or cold.  At rest. Pay attention to when you feel the baby is most active. This will help you notice a pattern of your baby's sleep and wake cycles and what factors contribute to an increase in fetal movement. It is important to perform a fetal movement count at the same time each day when your baby is normally most active.  HOW TO COUNT FETAL MOVEMENTS 1. Find a quiet and comfortable area to sit or lie down on your left side. Lying on your left side provides the best blood and oxygen circulation to your baby. 2. Write down the day and time on a sheet of paper or in a journal. 3. Start counting kicks, flutters, swishes, rolls, or jabs in a 2 hour period. You should feel at least 10 movements within 2 hours. 4. If you do not feel 10 movements in 2 hours, wait 2 3 hours and count again. Look for a change in the pattern or not enough counts in 2 hours. SEEK MEDICAL CARE IF:  You feel less than 10 counts in 2 hours, tried twice.  There is no movement in over an hour.  The pattern is changing or taking longer each day to reach 10 counts in 2 hours.  You feel the baby is not moving as he or she usually does. Date: ____________ Movements: ____________ Start time: ____________ Finish time: ____________  Date: ____________ Movements: ____________ Start time: ____________ Finish time: ____________ Date: ____________ Movements: ____________ Start time: ____________ Finish time: ____________ Date: ____________ Movements: ____________  Start time: ____________ Finish time: ____________ Date: ____________ Movements: ____________ Start time: ____________ Finish time: ____________ Date: ____________ Movements: ____________ Start time: ____________ Finish time: ____________ Date: ____________ Movements: ____________ Start time: ____________ Finish time: ____________ Date: ____________ Movements: ____________ Start time: ____________ Finish time: ____________  Date: ____________ Movements: ____________ Start time: ____________ Finish time: ____________ Date: ____________ Movements: ____________ Start time: ____________ Finish time: ____________ Date: ____________ Movements: ____________ Start time: ____________ Finish time: ____________ Date: ____________ Movements: ____________ Start time: ____________ Finish time: ____________ Date: ____________ Movements: ____________ Start time: ____________ Finish time: ____________ Date: ____________ Movements: ____________ Start time: ____________ Finish time: ____________ Date: ____________ Movements: ____________ Start time: ____________ Finish time: ____________  Date: ____________ Movements: ____________ Start time: ____________ Finish time: ____________ Date: ____________ Movements: ____________ Start time: ____________ Finish time: ____________ Date: ____________ Movements: ____________ Start time: ____________ Finish time: ____________ Date: ____________ Movements: ____________ Start time: ____________ Finish time: ____________ Date: ____________ Movements: ____________ Start time: ____________ Finish time: ____________ Date: ____________ Movements: ____________ Start time: ____________ Finish time: ____________ Date: ____________ Movements: ____________ Start time: ____________ Finish time: ____________  Date: ____________ Movements: ____________ Start time: ____________ Finish time: ____________ Date: ____________ Movements: ____________ Start time: ____________ Finish time:  ____________ Date: ____________ Movements: ____________ Start time: ____________ Finish time: ____________ Date: ____________ Movements: ____________ Start time: ____________ Finish time: ____________ Date: ____________ Movements: ____________ Start time: ____________ Finish time: ____________ Date: ____________ Movements: ____________ Start time: ____________ Finish time: ____________ Date: ____________ Movements: ____________ Start time: ____________ Finish time: ____________  Date: ____________ Movements: ____________ Start time: ____________ Finish   time: ____________ Date: ____________ Movements: ____________ Start time: ____________ Finish time: ____________ Date: ____________ Movements: ____________ Start time: ____________ Finish time: ____________ Date: ____________ Movements: ____________ Start time: ____________ Finish time: ____________ Date: ____________ Movements: ____________ Start time: ____________ Finish time: ____________ Date: ____________ Movements: ____________ Start time: ____________ Finish time: ____________ Date: ____________ Movements: ____________ Start time: ____________ Finish time: ____________  Date: ____________ Movements: ____________ Start time: ____________ Finish time: ____________ Date: ____________ Movements: ____________ Start time: ____________ Finish time: ____________ Date: ____________ Movements: ____________ Start time: ____________ Finish time: ____________ Date: ____________ Movements: ____________ Start time: ____________ Finish time: ____________ Date: ____________ Movements: ____________ Start time: ____________ Finish time: ____________ Date: ____________ Movements: ____________ Start time: ____________ Finish time: ____________ Date: ____________ Movements: ____________ Start time: ____________ Finish time: ____________  Date: ____________ Movements: ____________ Start time: ____________ Finish time: ____________ Date: ____________ Movements:  ____________ Start time: ____________ Finish time: ____________ Date: ____________ Movements: ____________ Start time: ____________ Finish time: ____________ Date: ____________ Movements: ____________ Start time: ____________ Finish time: ____________ Date: ____________ Movements: ____________ Start time: ____________ Finish time: ____________ Date: ____________ Movements: ____________ Start time: ____________ Finish time: ____________ Date: ____________ Movements: ____________ Start time: ____________ Finish time: ____________  Date: ____________ Movements: ____________ Start time: ____________ Finish time: ____________ Date: ____________ Movements: ____________ Start time: ____________ Finish time: ____________ Date: ____________ Movements: ____________ Start time: ____________ Finish time: ____________ Date: ____________ Movements: ____________ Start time: ____________ Finish time: ____________ Date: ____________ Movements: ____________ Start time: ____________ Finish time: ____________ Date: ____________ Movements: ____________ Start time: ____________ Finish time: ____________ Document Released: 08/11/2006 Document Revised: 06/28/2012 Document Reviewed: 05/08/2012 [employer] Patient Information 2014 [employer].  

## 2013-04-11 NOTE — Progress Notes (Signed)
Pulse- 87 

## 2013-04-11 NOTE — Progress Notes (Signed)
Doing well. Has not had interval growth scan for marginal cord insertion>

## 2013-04-18 ENCOUNTER — Ambulatory Visit (INDEPENDENT_AMBULATORY_CARE_PROVIDER_SITE_OTHER): Payer: Medicaid Other | Admitting: Advanced Practice Midwife

## 2013-04-18 ENCOUNTER — Encounter: Payer: Self-pay | Admitting: Advanced Practice Midwife

## 2013-04-18 VITALS — BP 117/82 | Temp 98.2°F | Wt 163.9 lb

## 2013-04-18 DIAGNOSIS — IMO0001 Reserved for inherently not codable concepts without codable children: Secondary | ICD-10-CM

## 2013-04-18 DIAGNOSIS — IMO0002 Reserved for concepts with insufficient information to code with codable children: Secondary | ICD-10-CM

## 2013-04-18 LAB — POCT URINALYSIS DIP (DEVICE)
Glucose, UA: NEGATIVE mg/dL
Hgb urine dipstick: NEGATIVE
Ketones, ur: NEGATIVE mg/dL
Leukocytes, UA: NEGATIVE
Nitrite: NEGATIVE
Protein, ur: 30 mg/dL — AB
Specific Gravity, Urine: 1.025 (ref 1.005–1.030)
Urobilinogen, UA: 1 mg/dL (ref 0.0–1.0)
pH: 7 (ref 5.0–8.0)

## 2013-04-18 NOTE — Progress Notes (Signed)
Pulse- 72 Patient reports pelvic pain & pressure and occasional contractions; reports a lot of watery d/c as of two days ago

## 2013-04-18 NOTE — Patient Instructions (Signed)
Vaginal Delivery  Your caregiver must first be sure you are in labor. Signs of labor include:   You may pass what is called "the mucus plug" before labor begins. This is a small amount of blood stained mucus.   Regular uterine contractions.   The time between contractions get closer together.   The discomfort and pain gradually gets more intense.   Pains are mostly located in the back.   Pains get worse when walking.   The cervix (the opening of the uterus) becomes thinner (begins to efface) and opens up (dilates).  Once you are in labor and admitted into the hospital or care center, your caregiver will do the following:   A complete physical examination.   Check your vital signs (blood pressure, pulse, temperature and the fetal heart rate).   Do a vaginal examination (using a sterile glove and lubricant) to determine:   The position (presentation) of the baby (head [vertex] or buttock first).   The level (station) of the baby's head in the birth canal.   The effacement and dilatation of the cervix.   You may have your pubic hair shaved and be given an enema depending on your caregiver and the circumstance.   An electronic monitor is usually placed on your abdomen. The monitor follows the length and intensity of the contractions, as well as the baby's heart rate.   Usually, your caregiver will insert an IV in your arm with a bottle of sugar water. This is done as a precaution so that medications can be given to you quickly during labor or delivery.  NORMAL LABOR AND DELIVERY IS DIVIDED UP INTO 3 STAGES:  First Stage  This is when regular contractions begin and the cervix begins to efface and dilate. This stage can last from 3 to 15 hours. The end of the first stage is when the cervix is 100% effaced and 10 centimeters dilated. Pain medications may be given by    Injection (morphine, demerol, etc.)    Regional anesthesia (spinal, caudal or epidural, anesthetics given in different locations of the spine). Paracervical pain medication may be given, which is an injection of and anesthetic on each side of the cervix.  A pregnant woman may request to have "Natural Childbirth" which is not to have any medications or anesthesia during her labor and delivery.  Second Stage  This is when the baby comes down through the birth canal (vagina) and is born. This can take 1 to 4 hours. As the baby's head comes down through the birth canal, you may feel like you are going to have a bowel movement. You will get the urge to bear down and push until the baby is delivered. As the baby's head is being delivered, the caregiver will decide if an episiotomy (a cut in the perineum and vagina area) is needed to prevent tearing of the tissue in this area. The episiotomy is sewn up after the delivery of the baby and placenta. Sometimes a mask with nitrous oxide is given for the mother to breath during the delivery of the baby to help if there is too much pain. The end of Stage 2 is when the baby is fully delivered. Then when the umbilical cord stops pulsating it is clamped and cut.  Third Stage  The third stage begins after the baby is completely delivered and ends after the placenta (afterbirth) is delivered. This usually takes 5 to 30 minutes. After the placenta is delivered, a medication is given   either by intravenous or injection to help contract the uterus and prevent bleeding. The third stage is not painful and pain medication is usually not necessary. If an episiotomy was done, it is repaired at this time.  After the delivery, the mother is watched and monitored closely for 1 to 2 hours to make sure there is no postpartum bleeding (hemorrhage). If there is a lot of bleeding, medication is given to contract the uterus and stop the bleeding.  Document Released: 04/20/2008 Document Revised: 04/05/2012 Document Reviewed: 04/20/2008   [employer] Patient Information 2014 [employer]

## 2013-04-18 NOTE — Progress Notes (Signed)
No [place] for marginal cord insertion. Patient is concerned. Good FM. More UCs this week. Membranes swept. Per Dr Debroah Loop, no need for early IOL, but OK to do on Monday 9/29 at 0730.  First labor went fast and did not push long

## 2013-04-19 ENCOUNTER — Encounter (HOSPITAL_COMMUNITY): Payer: Self-pay | Admitting: Emergency Medicine

## 2013-04-19 ENCOUNTER — Emergency Department (HOSPITAL_COMMUNITY)
Admission: EM | Admit: 2013-04-19 | Discharge: 2013-04-19 | Disposition: A | Discharge status: Home / Self Care | Payer: Medicaid Other | Source: Home / Self Care | Attending: Family Medicine | Admitting: Family Medicine

## 2013-04-19 ENCOUNTER — Telehealth (HOSPITAL_COMMUNITY): Payer: Self-pay | Admitting: *Deleted

## 2013-04-19 DIAGNOSIS — H6092 Unspecified otitis externa, left ear: Secondary | ICD-10-CM

## 2013-04-19 DIAGNOSIS — H60399 Other infective otitis externa, unspecified ear: Secondary | ICD-10-CM

## 2013-04-19 MED ORDER — NEOMYCIN-POLYMYXIN-HC 3.5-10000-1 OT SUSP: 4.0000 [drp] | Freq: Three times a day (TID) | OTIC | Status: DC

## 2013-04-19 NOTE — ED Notes (Signed)
Pt c/o tissue stuck in left ear x 2 days. She reports that her ear had been draining and she used tissue to wipe it and feels like some tissue might be left in her ear. Pt is alert and oriented.

## 2013-04-19 NOTE — ED Provider Notes (Signed)
CSN: [id number]     Arrival date & time 04/19/13  1210 History   First MD Initiated Contact with Patient 04/19/13 1333     Chief Complaint  Patient presents with  . Foreign Body in Ear   (Consider location/radiation/quality/duration/timing/severity/associated sxs/prior Treatment) HPI Comments: 27 year old female in her there trimester of pregnancy. Here complaining of left ear pain and drainage for about 2 days. Patient reports that she used a rolled napkin to "clean" inside her left ear canal and is not sure if she got it back out completely as she feels her ear "stuffed". She's otherwise doing well denies fever or chills. Denies sore throat or headache.   Past Medical History  Diagnosis Date  . Urinary tract infection   . Genital warts     acid treatment in 2007  . Pregnancy induced hypertension    Past Surgical History  Procedure Laterality Date  . Hernia repair      umbilical  . Wisdom tooth extraction     Family History  Problem Relation Age of Onset  . Anesthesia problems Neg Hx   . Other Neg Hx    History  Substance Use Topics  . Smoking status: Former Smoker    Quit date: 02/07/2011  . Smokeless tobacco: Never Used     Comment: in 2011  . Alcohol Use: Yes     Comment: occasional   OB History   Grav Para Term Preterm Abortions TAB SAB Ect Mult Living   3 1 1  0 1 1 0 0 0 1     Review of Systems  Constitutional: Negative for fever, chills and appetite change.  HENT: Positive for ear pain and ear discharge. Negative for congestion, sore throat, rhinorrhea, trouble swallowing and sinus pressure.   Respiratory: Negative for cough, shortness of breath and wheezing.   Cardiovascular: Negative for leg swelling.  Gastrointestinal: Negative for nausea, vomiting and abdominal pain.  Skin: Negative for rash.  Neurological: Negative for dizziness and headaches.    Allergies  Review of patient's allergies indicates no known allergies.  Home Medications   Current  Outpatient Rx  Name  Route  Sig  Dispense  Refill  . neomycin-polymyxin-hydrocortisone (CORTISPORIN) 3.5-10000-1 otic suspension   Left Ear   Place 4 drops into the left ear 3 (three) times daily.   10 mL   0   . pantoprazole (PROTONIX) 20 MG tablet   Oral   Take 1 tablet (20 mg total) by mouth 2 (two) times daily.   60 tablet   3   . ranitidine (ZANTAC) 150 MG tablet   Oral   Take 1 tablet (150 mg total) by mouth 2 (two) times daily.   60 tablet   5    BP 117/76  Pulse 73  Temp(Src) 98.5 F (36.9 C) (Oral)  Resp 18  SpO2 98%  LMP 07/13/2012 Physical Exam  Nursing note and vitals reviewed. Constitutional: She is oriented to person, place, and time. She appears well-developed and well-nourished. No distress.  HENT:  Head: Normocephalic and atraumatic.  Right Ear: External ear normal.  Nose: Nose normal.  Mouth/Throat: Oropharynx is clear and moist. No oropharyngeal exudate.  Left external ear canal with mild to moderate swelling and exudates. Partially observed TM appears normal.  Eyes: Conjunctivae are normal.  Neck: Neck supple.  Cardiovascular: Normal rate, regular rhythm and normal heart sounds.   Pulmonary/Chest: Effort normal and breath sounds normal. No respiratory distress.  Abdominal: Soft. There is no tenderness.  pregnant  Lymphadenopathy:    She has no cervical adenopathy.  Neurological: She is alert and oriented to person, place, and time.  Skin: No rash noted. She is not diaphoretic.    ED Course  Procedures (including critical care time) Labs Review Labs Reviewed - No data to display Imaging Review No results found.  MDM   1. Otitis externa, left    Prescribed Cortisporin otic. Supportive care and red flags that should prompt her return to medical attention discussed with patient and provided in writing.   Sharin Grave, MD 04/23/13 [phone]

## 2013-04-19 NOTE — Telephone Encounter (Signed)
Preadmission screen  

## 2013-04-20 ENCOUNTER — Encounter: Payer: Self-pay | Admitting: *Deleted

## 2013-04-23 ENCOUNTER — Encounter: Payer: Self-pay | Admitting: Advanced Practice Midwife

## 2013-04-23 ENCOUNTER — Inpatient Hospital Stay (HOSPITAL_COMMUNITY)
Admission: RE | Admit: 2013-04-23 | Discharge: 2013-04-25 | DRG: 775 | Disposition: A | Discharge status: Home / Self Care | Payer: Medicaid Other | Source: Ambulatory Visit | Attending: Obstetrics & Gynecology | Admitting: Obstetrics & Gynecology

## 2013-04-23 VITALS — BP 138/74 | HR 54 | Temp 97.5°F | Resp 18

## 2013-04-23 DIAGNOSIS — O99820 Streptococcus B carrier state complicating pregnancy: Secondary | ICD-10-CM

## 2013-04-23 DIAGNOSIS — Z22330 Carrier of Group B streptococcus: Secondary | ICD-10-CM

## 2013-04-23 DIAGNOSIS — IMO0001 Reserved for inherently not codable concepts without codable children: Secondary | ICD-10-CM

## 2013-04-23 DIAGNOSIS — O480 Post-term pregnancy: Principal | ICD-10-CM | POA: Diagnosis present

## 2013-04-23 DIAGNOSIS — O99892 Other specified diseases and conditions complicating childbirth: Secondary | ICD-10-CM | POA: Diagnosis present

## 2013-04-23 LAB — CBC
HCT: 35.3 % — ABNORMAL LOW (ref 36.0–46.0)
Hemoglobin: 11.3 g/dL — ABNORMAL LOW (ref 12.0–15.0)
MCH: 24.7 pg — ABNORMAL LOW (ref 26.0–34.0)
MCHC: 32 g/dL (ref 30.0–36.0)
MCV: 77.2 fL — ABNORMAL LOW (ref 78.0–100.0)
Platelets: 174 10*3/uL (ref 150–400)
RBC: 4.57 MIL/uL (ref 3.87–5.11)
RDW: 14.6 % (ref 11.5–15.5)
WBC: 6.9 10*3/uL (ref 4.0–10.5)

## 2013-04-23 LAB — RPR: RPR Ser Ql: NONREACTIVE

## 2013-04-23 MED ORDER — PENICILLIN G POTASSIUM 5000000 UNITS IJ SOLR
5.0000 10*6.[IU] | Freq: Once | INTRAVENOUS | Status: AC
  Administered 2013-04-23: 5 10*6.[IU] via INTRAVENOUS
  Filled 2013-04-23: qty 5

## 2013-04-23 MED ORDER — TERBUTALINE SULFATE 1 MG/ML IJ SOLN: 0.2500 mg | Freq: Once | INTRAMUSCULAR | Status: AC | PRN

## 2013-04-23 MED ORDER — OXYTOCIN 40 UNITS IN LACTATED RINGERS INFUSION - SIMPLE MED: 62.5000 mL/h | INTRAVENOUS | Status: DC

## 2013-04-23 MED ORDER — PHENYLEPHRINE 40 MCG/ML (10ML) SYRINGE FOR IV PUSH (FOR BLOOD PRESSURE SUPPORT)
80.0000 ug | PREFILLED_SYRINGE | INTRAVENOUS | Status: DC | PRN
  Filled 2013-04-23: qty 2

## 2013-04-23 MED ORDER — FENTANYL 2.5 MCG/ML BUPIVACAINE 1/10 % EPIDURAL INFUSION (WH - ANES)
14.0000 mL/h | INTRAMUSCULAR | Status: DC | PRN
  Administered 2013-04-24: 14 mL/h via EPIDURAL
  Filled 2013-04-23: qty 125

## 2013-04-23 MED ORDER — LACTATED RINGERS IV SOLN
500.0000 mL | INTRAVENOUS | Status: DC | PRN
  Administered 2013-04-24: 500 mL via INTRAVENOUS
  Administered 2013-04-24: 1000 mL via INTRAVENOUS

## 2013-04-23 MED ORDER — IBUPROFEN 600 MG PO TABS
600.0000 mg | ORAL_TABLET | Freq: Four times a day (QID) | ORAL | Status: DC | PRN
  Filled 2013-04-23: qty 1

## 2013-04-23 MED ORDER — MISOPROSTOL 200 MCG PO TABS
50.0000 ug | ORAL_TABLET | ORAL | Status: DC
  Administered 2013-04-23 (×2): 50 ug via ORAL
  Filled 2013-04-23 (×2): qty 0.5

## 2013-04-23 MED ORDER — LIDOCAINE HCL (PF) 1 % IJ SOLN
30.0000 mL | INTRAMUSCULAR | Status: DC | PRN
  Filled 2013-04-23 (×2): qty 30

## 2013-04-23 MED ORDER — PHENYLEPHRINE 40 MCG/ML (10ML) SYRINGE FOR IV PUSH (FOR BLOOD PRESSURE SUPPORT)
80.0000 ug | PREFILLED_SYRINGE | INTRAVENOUS | Status: DC | PRN
  Filled 2013-04-23: qty 5
  Filled 2013-04-23: qty 2

## 2013-04-23 MED ORDER — OXYCODONE-ACETAMINOPHEN 5-325 MG PO TABS: 1.0000 | ORAL_TABLET | ORAL | Status: DC | PRN

## 2013-04-23 MED ORDER — DIPHENHYDRAMINE HCL 50 MG/ML IJ SOLN: 12.5000 mg | INTRAMUSCULAR | Status: DC | PRN

## 2013-04-23 MED ORDER — ACETAMINOPHEN 325 MG PO TABS: 650.0000 mg | ORAL_TABLET | ORAL | Status: DC | PRN

## 2013-04-23 MED ORDER — LACTATED RINGERS IV SOLN
INTRAVENOUS | Status: DC
  Administered 2013-04-24 (×2): via INTRAVENOUS

## 2013-04-23 MED ORDER — PENICILLIN G POTASSIUM 5000000 UNITS IJ SOLR
2.5000 10*6.[IU] | INTRAVENOUS | Status: DC
  Administered 2013-04-23 – 2013-04-24 (×4): 2.5 10*6.[IU] via INTRAVENOUS
  Filled 2013-04-23 (×7): qty 2.5

## 2013-04-23 MED ORDER — OXYTOCIN BOLUS FROM INFUSION: 500.0000 mL | INTRAVENOUS | Status: DC

## 2013-04-23 MED ORDER — FENTANYL CITRATE 0.05 MG/ML IJ SOLN
100.0000 ug | INTRAMUSCULAR | Status: DC | PRN
  Administered 2013-04-23 (×4): 100 ug via INTRAVENOUS
  Filled 2013-04-23 (×4): qty 2

## 2013-04-23 MED ORDER — CITRIC ACID-SODIUM CITRATE 334-500 MG/5ML PO SOLN: 30.0000 mL | Freq: Once | ORAL | Status: DC

## 2013-04-23 MED ORDER — LACTATED RINGERS IV SOLN
500.0000 mL | Freq: Once | INTRAVENOUS | Status: AC
  Administered 2013-04-23: 500 mL via INTRAVENOUS

## 2013-04-23 MED ORDER — EPHEDRINE 5 MG/ML INJ
10.0000 mg | INTRAVENOUS | Status: DC | PRN
  Filled 2013-04-23: qty 2

## 2013-04-23 MED ORDER — CITRIC ACID-SODIUM CITRATE 334-500 MG/5ML PO SOLN
30.0000 mL | ORAL | Status: DC | PRN
  Administered 2013-04-23: 30 mL via ORAL
  Filled 2013-04-23: qty 15

## 2013-04-23 MED ORDER — OXYTOCIN 40 UNITS IN LACTATED RINGERS INFUSION - SIMPLE MED
1.0000 m[IU]/min | INTRAVENOUS | Status: DC
  Administered 2013-04-23: 2 m[IU]/min via INTRAVENOUS
  Filled 2013-04-23: qty 1000

## 2013-04-23 MED ORDER — ONDANSETRON HCL 4 MG/2ML IJ SOLN: 4.0000 mg | Freq: Four times a day (QID) | INTRAMUSCULAR | Status: DC | PRN

## 2013-04-23 MED ORDER — EPHEDRINE 5 MG/ML INJ
10.0000 mg | INTRAVENOUS | Status: DC | PRN
  Filled 2013-04-23: qty 2
  Filled 2013-04-23: qty 4

## 2013-04-23 NOTE — Progress Notes (Signed)
Taylor Bowen is a 27 y.o. G3P1011 at [redacted]w[redacted]d  admitted for IOL for post dates and marginal cord insertion.   Subjective:  Pt comfortable. No complaints. No vb, lof. +FM  Objective: BP 125/82  Pulse 77  Temp(Src) 98 F (36.7 C) (Oral)  Resp 18  LMP 07/13/2012      FHT:  FHR: 130 bpm, variability: moderate,  accelerations:  Present,  decelerations:  Absent UC:   irregular, every 3-6 minutes SVE:   Dilation: 1 Effacement (%): 40 Station: -3 Exam by:: Dr Reola Calkins  Labs: Lab Results  Component Value Date   WBC 6.9 04/23/2013   HGB 11.3* 04/23/2013   HCT 35.3* 04/23/2013   MCV 77.2* 04/23/2013   PLT 174 04/23/2013    Assessment / Plan: IOL for post dates and marginal cord insertion  Labor: in latent labor. s/p cytotec x 1. cytotec 2 placed and fb inserted with 60cc of LR.  Fetal Wellbeing:  Category I Pain Control:  Labor support without medications I/D:  gbs pos on PCN Anticipated MOD:  NSVD  Kriti Katayama L 04/23/2013, 2:58 PM

## 2013-04-23 NOTE — H&P (Signed)
Attestation of Attending Supervision of Advanced Practitioner (CNM/NP): Evaluation and management procedures were performed by the Advanced Practitioner under my supervision and collaboration.  I have reviewed the Advanced Practitioner's note and chart, and I agree with the management and plan.  HARRAWAY-SMITH, Zaliyah Meikle 12:13 PM     

## 2013-04-23 NOTE — H&P (Signed)
Taylor Bowen is a 27 y.o. female G82P1011 with IUP at [redacted]w[redacted]d presenting for IOL for postdates and marginal cord insertion. Pt states she has been having irregular weak contractions. Some brown vaginal discharge since having her membranes stripped in clinic.   Membranes are intact, with active fetal movement.   [facility] wks. Pregnancy has been uncomplicated overall. She does have a marginal cord insertion and is GBS +.  Otherwise uncomplicated.   Prenatal History/Complications:  Past Medical History: Past Medical History  Diagnosis Date  . Urinary tract infection   . Genital warts     acid treatment in 2007  . Pregnancy induced hypertension     Past Surgical History: Past Surgical History  Procedure Laterality Date  . Hernia repair      umbilical  . Wisdom tooth extraction      Obstetrical History: OB History   Grav Para Term Preterm Abortions TAB SAB Ect Mult Living   3 1 1  0 1 1 0 0 0 1      Social History: History   Social History  . Marital Status: Single    Spouse Name: N/A    Number of Children: N/A  . Years of Education: N/A   Social History Main Topics  . Smoking status: Former Smoker    Quit date: 02/07/2011  . Smokeless tobacco: Never Used     Comment: in 2011  . Alcohol Use: Yes     Comment: occasional  . Drug Use: No  . Sexual Activity: Yes    Birth Control/ Protection: Condom, None   Other Topics Concern  . Not on file   Social History Narrative  . No narrative on file    Family History: Family History  Problem Relation Age of Onset  . Anesthesia problems Neg Hx   . Other Neg Hx     Allergies: No Known Allergies  Prescriptions prior to admission  Medication Sig Dispense Refill  . neomycin-polymyxin-hydrocortisone (CORTISPORIN) 3.5-10000-1 otic suspension Place 4 drops into the left ear 3 (three) times daily.  10 mL  0  . ranitidine (ZANTAC) 150 MG tablet Take 300 mg by mouth daily.         Review of Systems    Constitutional: Negative for fever, chills, weight loss, malaise/fatigue and diaphoresis.  HENT: Negative for hearing loss, ear pain, nosebleeds, congestion, sore throat, neck pain, tinnitus and ear discharge.   Eyes: Negative for blurred vision, double vision, photophobia, pain, discharge and redness.  Respiratory: Negative for cough, hemoptysis, sputum production, shortness of breath, wheezing and stridor.   Cardiovascular: Negative for chest pain, palpitations, orthopnea,  leg swelling  Gastrointestinal: Positive for abdominal pain. Negative for heartburn, nausea, vomiting, diarrhea, constipation, blood in stool Genitourinary: Negative for dysuria, urgency, frequency, hematuria and flank pain.  Musculoskeletal: Negative for myalgias, back pain, joint pain and falls.  Skin: Negative for itching and rash.  Neurological: Negative for dizziness, tingling, tremors, sensory change, speech change, focal weakness, seizures, loss of consciousness, weakness and headaches.  Endo/Heme/Allergies: Negative for environmental allergies and polydipsia. Does not bruise/bleed easily.  Psychiatric/Behavioral: Negative for depression, suicidal ideas, hallucinations, memory loss and substance abuse. The patient is not nervous/anxious and does not have insomnia.       Blood pressure 117/68, pulse 74, temperature 97.9 F (36.6 C), temperature source Oral, resp. rate 18, last menstrual period 07/13/2012. General appearance: alert, cooperative and no distress Lungs: clear to auscultation bilaterally Heart: regular rate and rhythm Abdomen: soft, non-tender;  bowel sounds normal Pelvic: 1/40/-3 medium Extremities: Homans sign is negative, no sign of DVT Presentation: cephalic Fetal monitoringBaseline: 125 bpm, Variability: Good {> 6 bpm), Accelerations: Reactive and Decelerations: Absent Uterine activity irritability  Dilation: 1 Effacement (%): 40 Station: -3 Exam by:: Dr Reola Calkins   Prenatal labs: ABO, Rh:  A/POS/-- (03/12 1623) Antibody: NEG (03/12 1623) Rubella:   RPR: NON REAC (07/08 1138)  HBsAg: NEGATIVE (03/12 1623)  HIV: NON REACTIVE (03/12 1623)  GBS: Positive (08/29 0000)  1 hr Glucola 114 Genetic screening  normal Anatomy US normal   Assessment: Taylor Bowen is a 27 y.o. G3P1011 with an IUP at [redacted]w[redacted]d presenting for IOL for post dates and marginal cord insertion.   Plan: 1) IOL  - admit to L&D - routine orders - pain control with meds currently and plan for epidural eventually - cervical ripening with cytotec initially and then plan to place foley bulb at next check.   2) FWB - cat I tracing - GBS pos- will place on PCN   3) anticipate SVD   Jasten Guyette L, MD 04/23/2013, 9:52 AM

## 2013-04-23 NOTE — Progress Notes (Signed)
Pt up to walk on unit with family. 

## 2013-04-24 ENCOUNTER — Inpatient Hospital Stay (HOSPITAL_COMMUNITY): Payer: Medicaid Other | Admitting: Anesthesiology

## 2013-04-24 ENCOUNTER — Encounter (HOSPITAL_COMMUNITY): Payer: Self-pay

## 2013-04-24 ENCOUNTER — Encounter (HOSPITAL_COMMUNITY): Payer: Self-pay | Admitting: Anesthesiology

## 2013-04-24 DIAGNOSIS — O99892 Other specified diseases and conditions complicating childbirth: Secondary | ICD-10-CM

## 2013-04-24 DIAGNOSIS — O480 Post-term pregnancy: Secondary | ICD-10-CM

## 2013-04-24 DIAGNOSIS — O9989 Other specified diseases and conditions complicating pregnancy, childbirth and the puerperium: Secondary | ICD-10-CM

## 2013-04-24 MED ORDER — LANOLIN HYDROUS EX OINT: TOPICAL_OINTMENT | CUTANEOUS | Status: DC | PRN

## 2013-04-24 MED ORDER — ONDANSETRON HCL 4 MG/2ML IJ SOLN: 4.0000 mg | INTRAMUSCULAR | Status: DC | PRN

## 2013-04-24 MED ORDER — INFLUENZA VAC SPLIT QUAD 0.5 ML IM SUSP
0.5000 mL | INTRAMUSCULAR | Status: AC
  Administered 2013-04-25: 0.5 mL via INTRAMUSCULAR
  Filled 2013-04-24: qty 0.5

## 2013-04-24 MED ORDER — OXYCODONE-ACETAMINOPHEN 5-325 MG PO TABS: 1.0000 | ORAL_TABLET | ORAL | Status: DC | PRN

## 2013-04-24 MED ORDER — WITCH HAZEL-GLYCERIN EX PADS: 1.0000 "application " | MEDICATED_PAD | CUTANEOUS | Status: DC | PRN

## 2013-04-24 MED ORDER — DIBUCAINE 1 % RE OINT
1.0000 "application " | TOPICAL_OINTMENT | RECTAL | Status: DC | PRN
  Filled 2013-04-24: qty 28

## 2013-04-24 MED ORDER — DIPHENHYDRAMINE HCL 25 MG PO CAPS: 25.0000 mg | ORAL_CAPSULE | Freq: Four times a day (QID) | ORAL | Status: DC | PRN

## 2013-04-24 MED ORDER — SENNOSIDES-DOCUSATE SODIUM 8.6-50 MG PO TABS
2.0000 | ORAL_TABLET | ORAL | Status: DC
  Administered 2013-04-25: 2 via ORAL

## 2013-04-24 MED ORDER — LIDOCAINE HCL (PF) 1 % IJ SOLN
INTRAMUSCULAR | Status: DC | PRN
  Administered 2013-04-24 (×4): 4 mL

## 2013-04-24 MED ORDER — SIMETHICONE 80 MG PO CHEW: 80.0000 mg | CHEWABLE_TABLET | ORAL | Status: DC | PRN

## 2013-04-24 MED ORDER — TETANUS-DIPHTH-ACELL PERTUSSIS 5-2.5-18.5 LF-MCG/0.5 IM SUSP
0.5000 mL | Freq: Once | INTRAMUSCULAR | Status: AC
  Administered 2013-04-25: 0.5 mL via INTRAMUSCULAR
  Filled 2013-04-24: qty 0.5

## 2013-04-24 MED ORDER — PRENATAL MULTIVITAMIN CH
1.0000 | ORAL_TABLET | Freq: Every day | ORAL | Status: DC
  Administered 2013-04-24: 1 via ORAL
  Filled 2013-04-24: qty 1

## 2013-04-24 MED ORDER — IBUPROFEN 600 MG PO TABS
600.0000 mg | ORAL_TABLET | Freq: Four times a day (QID) | ORAL | Status: DC
  Administered 2013-04-24 – 2013-04-25 (×5): 600 mg via ORAL
  Filled 2013-04-24 (×4): qty 1

## 2013-04-24 MED ORDER — ONDANSETRON HCL 4 MG PO TABS: 4.0000 mg | ORAL_TABLET | ORAL | Status: DC | PRN

## 2013-04-24 MED ORDER — ZOLPIDEM TARTRATE 5 MG PO TABS: 5.0000 mg | ORAL_TABLET | Freq: Every evening | ORAL | Status: DC | PRN

## 2013-04-24 MED ORDER — BENZOCAINE-MENTHOL 20-0.5 % EX AERO
1.0000 "application " | INHALATION_SPRAY | CUTANEOUS | Status: DC | PRN
  Administered 2013-04-24: 1 via TOPICAL
  Filled 2013-04-24: qty 56

## 2013-04-24 NOTE — Progress Notes (Signed)
Taylor Bowen is a 27 y.o. G3P1011 at [redacted]w[redacted]d admitted for induction of labor due to Post dates..  Subjective: Good pain control with epidural. Deep decel, d/c'd pitocin and position change improved.  Objective: BP 117/73  Pulse 79  Temp(Src) 98.6 F (37 C) (Axillary)  Resp 18  SpO2 96%  LMP 07/13/2012      FHT:  FHR: 120s bpm, variability: moderate,  accelerations:  Present,  decelerations:  Present prolonged decel to 90s that resolved. occassional variable or late decel. UC:   regular, every 2-3 minutes SVE:   Dilation: 7 Effacement (%): 90 Station: -2 Exam by:: dr Erin Fulling  Labs: Lab Results  Component Value Date   WBC 6.9 04/23/2013   HGB 11.3* 04/23/2013   HCT 35.3* 04/23/2013   MCV 77.2* 04/23/2013   PLT 174 04/23/2013    Assessment / Plan: Induction of labor due to postterm,  progressing well on pitocin  Labor: Now off pitocin 2/2 decels, close monitoring. Preeclampsia:  no signs or symptoms of toxicity Fetal Wellbeing:  Category II with recurrent decels, either rapid change or fetal intolerance Pain Control:  Epidural I/D:  GBS + on PCN Anticipated MOD:  NSVD  Zachrey Deutscher RYAN 04/24/2013, 2:34 AM

## 2013-04-24 NOTE — Anesthesia Procedure Notes (Signed)
Epidural Patient location during procedure: OB Start time: 04/24/2013 12:19 AM  Staffing Performed by: anesthesiologist   Preanesthetic Checklist Completed: patient identified, site marked, surgical consent, pre-op evaluation, timeout performed, IV checked, risks and benefits discussed and monitors and equipment checked  Epidural Patient position: sitting Prep: site prepped and draped and DuraPrep Patient monitoring: continuous pulse ox and blood pressure Approach: midline Injection technique: LOR air  Needle:  Needle type: Tuohy  Needle gauge: 17 G Needle length: 9 cm and 9 Needle insertion depth: 4.5 cm Catheter type: closed end flexible Catheter size: 19 Gauge Catheter at skin depth: 9.5 cm Test dose: negative  Assessment Events: blood not aspirated, injection not painful, no injection resistance, negative IV test and no paresthesia  Additional Notes Discussed risk of headache, infection, bleeding, nerve injury and failed or incomplete block.  Patient voices understanding and wishes to proceed.  Epidural placed easily on first attempt.  No paresthesia.  Patient tolerated procedure well with no apparent complications.  Jasmine December, MDReason for block:procedure for pain

## 2013-04-24 NOTE — Anesthesia Postprocedure Evaluation (Signed)
  Anesthesia Post-op Note  Anesthesia Post Note  Patient: Taylor Bowen  Procedure(s) Performed: * No procedures listed *  Anesthesia type: Epidural  Patient location: Mother/Baby  Post pain: Pain level controlled  Post assessment: Post-op Vital signs reviewed  Last Vitals:  Filed Vitals:   04/24/13 1135  BP: 108/61  Pulse: 70  Temp: 36.7 C  Resp: 18    Post vital signs: Reviewed  Level of consciousness:alert  Complications: No apparent anesthesia complications

## 2013-04-24 NOTE — Progress Notes (Signed)
Taylor Bowen is a 27 y.o. G3P1011 at [redacted]w[redacted]d admitted for induction of labor due to Post dates..  Subjective: Good pain control with epidural  Objective: BP 135/83  Pulse 62  Temp(Src) 98.5 F (36.9 C) (Oral)  Resp 18  SpO2 97%  LMP 07/13/2012      FHT:  FHR: 120s bpm, variability: moderate,  accelerations:  Present,  decelerations:  Present prolonged decel to 90s that resolved. occassional variable/early decel. UC:   regular, every 2-3 minutes SVE:   Dilation: 6 Effacement (%): 60 Station: -2 Exam by:: dr Jonavan Vanhorn  Labs: Lab Results  Component Value Date   WBC 6.9 04/23/2013   HGB 11.3* 04/23/2013   HCT 35.3* 04/23/2013   MCV 77.2* 04/23/2013   PLT 174 04/23/2013    Assessment / Plan: Induction of labor due to postterm,  progressing well on pitocin  Labor: Progressing on Pitocin, will continue to increase then AROM Preeclampsia:  no signs or symptoms of toxicity Fetal Wellbeing:  Category II Pain Control:  Epidural I/D:  GBS + on PCN Anticipated MOD:  NSVD  Taylor Bowen Taylor Bowen 04/24/2013, 12:50 AM

## 2013-04-24 NOTE — Progress Notes (Signed)
UR completed 

## 2013-04-24 NOTE — Anesthesia Preprocedure Evaluation (Signed)
Anesthesia Evaluation  Patient identified by MRN, date of birth, ID band Patient awake    Reviewed: Allergy & Precautions, H&P , NPO status , Patient's Chart, lab work & pertinent test results, reviewed documented beta blocker date and time   History of Anesthesia Complications Negative for: history of anesthetic complications  Airway Mallampati: III TM Distance: >3 FB Neck ROM: full    Dental  (+) Teeth Intact   Pulmonary former smoker (quit 2012),  breath sounds clear to auscultation        Cardiovascular negative cardio ROS  Rhythm:regular Rate:Normal     Neuro/Psych negative neurological ROS  negative psych ROS   GI/Hepatic Neg liver ROS, GERD-  Medicated,  Endo/Other  negative endocrine ROS  Renal/GU negative Renal ROS     Musculoskeletal   Abdominal   Peds  Hematology negative hematology ROS (+)   Anesthesia Other Findings   Reproductive/Obstetrics (+) Pregnancy                           Anesthesia Physical Anesthesia Plan  ASA: II  Anesthesia Plan: Epidural   Post-op Pain Management:    Induction:   Airway Management Planned:   Additional Equipment:   Intra-op Plan:   Post-operative Plan:   Informed Consent: I have reviewed the patients History and Physical, chart, labs and discussed the procedure including the risks, benefits and alternatives for the proposed anesthesia with the patient or authorized representative who has indicated his/her understanding and acceptance.     Plan Discussed with:   Anesthesia Plan Comments:         Anesthesia Quick Evaluation

## 2013-04-25 LAB — CBC
HCT: 34.1 % — ABNORMAL LOW (ref 36.0–46.0)
Hemoglobin: 11 g/dL — ABNORMAL LOW (ref 12.0–15.0)
MCH: 24.8 pg — ABNORMAL LOW (ref 26.0–34.0)
MCHC: 32.3 g/dL (ref 30.0–36.0)
MCV: 76.8 fL — ABNORMAL LOW (ref 78.0–100.0)
Platelets: 168 10*3/uL (ref 150–400)
RBC: 4.44 MIL/uL (ref 3.87–5.11)
RDW: 14.8 % (ref 11.5–15.5)
WBC: 12.4 10*3/uL — ABNORMAL HIGH (ref 4.0–10.5)

## 2013-04-25 MED ORDER — NORETHINDRONE 0.35 MG PO TABS: 1.0000 | ORAL_TABLET | Freq: Every day | ORAL | Status: DC

## 2013-04-25 MED ORDER — GNP PRENATAL VITAMINS 28-0.8 MG PO TABS: 1.0000 | ORAL_TABLET | Freq: Every day | ORAL | Status: DC

## 2013-04-25 MED ORDER — IBUPROFEN 200 MG PO TABS: 600.0000 mg | ORAL_TABLET | Freq: Four times a day (QID) | ORAL | Status: DC | PRN

## 2013-04-25 NOTE — Discharge Summary (Cosign Needed)
Obstetric Discharge Summary Reason for Admission: induction of labor Prenatal Procedures: none Intrapartum Procedures: spontaneous vaginal delivery Postpartum Procedures: none Complications-Operative and Postpartum: none Hemoglobin  Date Value Range Status  04/25/2013 11.0* 12.0 - 15.0 g/dL Final     HCT  Date Value Range Status  04/25/2013 34.1* 36.0 - 46.0 % Final    Physical Exam:  General: alert, cooperative, appears stated age and no distress Lochia: appropriate Uterine Fundus: firm U-1 Ctab no wrc RRR No mg/t DVT Evaluation: No evidence of DVT seen on physical exam. Negative Homan's sign. No cords or calf tenderness. No significant calf/ankle edema.  Discharge Diagnoses: Term Pregnancy-delivered  Discharge Information: Date: 04/25/2013 Activity: pelvic rest Diet: routine Medications: PNV, Ibuprofen and micronor Condition: stable Instructions: refer to practice specific booklet Discharge to: home Follow-up Information   Follow up with [facility]. Schedule an appointment as soon as possible for a visit in 4 weeks.   Specialty:  Obstetrics and Gynecology   Contact information:   [address] [place] [address] [phone]    IOL for PD and marginal cord uncomplicated NSVD. Pt tolerated well. No tears. Pt had uncomplicated PP course and is being discharged home.   Newborn Data: Live born female  Birth Weight: 6 lb 14.9 oz (3144 g) APGAR: 9, 9  Home with mother.  Taylor Bowen 04/25/2013, 8:14 AM

## 2013-04-26 ENCOUNTER — Encounter: Payer: Self-pay | Admitting: Obstetrics & Gynecology

## 2013-04-28 ENCOUNTER — Encounter (HOSPITAL_COMMUNITY): Payer: Self-pay | Admitting: *Deleted

## 2013-04-28 ENCOUNTER — Inpatient Hospital Stay (HOSPITAL_COMMUNITY)
Admission: AD | Admit: 2013-04-28 | Discharge: 2013-04-28 | Disposition: A | Discharge status: Home / Self Care | Payer: Medicaid Other | Source: Ambulatory Visit | Attending: Obstetrics & Gynecology | Admitting: Obstetrics & Gynecology

## 2013-04-28 DIAGNOSIS — R42 Dizziness and giddiness: Secondary | ICD-10-CM | POA: Insufficient documentation

## 2013-04-28 DIAGNOSIS — O99893 Other specified diseases and conditions complicating puerperium: Secondary | ICD-10-CM | POA: Insufficient documentation

## 2013-04-28 DIAGNOSIS — K5900 Constipation, unspecified: Secondary | ICD-10-CM | POA: Insufficient documentation

## 2013-04-28 HISTORY — DX: Constipation, unspecified: K59.00

## 2013-04-28 LAB — CBC
HCT: 39.5 % (ref 36.0–46.0)
Hemoglobin: 12.7 g/dL (ref 12.0–15.0)
MCH: 25 pg — ABNORMAL LOW (ref 26.0–34.0)
MCHC: 32.2 g/dL (ref 30.0–36.0)
MCV: 77.6 fL — ABNORMAL LOW (ref 78.0–100.0)
Platelets: 219 10*3/uL (ref 150–400)
RBC: 5.09 MIL/uL (ref 3.87–5.11)
RDW: 14.9 % (ref 11.5–15.5)
WBC: 6.6 10*3/uL (ref 4.0–10.5)

## 2013-04-28 LAB — COMPREHENSIVE METABOLIC PANEL
ALT: 35 U/L (ref 0–35)
AST: 32 U/L (ref 0–37)
Albumin: 3 g/dL — ABNORMAL LOW (ref 3.5–5.2)
Alkaline Phosphatase: 138 U/L — ABNORMAL HIGH (ref 39–117)
BUN: 6 mg/dL (ref 6–23)
CO2: 28 mEq/L (ref 19–32)
Calcium: 9.3 mg/dL (ref 8.4–10.5)
Chloride: 101 mEq/L (ref 96–112)
Creatinine, Ser: 0.72 mg/dL (ref 0.50–1.10)
GFR calc Af Amer: >90 mL/min (ref >90)
GFR calc non Af Amer: >90 mL/min (ref >90)
Glucose, Bld: 81 mg/dL (ref 70–99)
Potassium: 3.5 mEq/L (ref 3.5–5.1)
Sodium: 138 mEq/L (ref 135–145)
Total Bilirubin: 0.5 mg/dL (ref 0.3–1.2)
Total Protein: 7.2 g/dL (ref 6.0–8.3)

## 2013-04-28 LAB — PROTEIN / CREATININE RATIO, URINE
Creatinine, Urine: 46.77 mg/dL
Protein Creatinine Ratio: 0.75 — ABNORMAL HIGH (ref 0.00–0.15)
Total Protein, Urine: 35.3 mg/dL

## 2013-04-28 MED ORDER — ACETAMINOPHEN 325 MG PO TABS
650.0000 mg | ORAL_TABLET | Freq: Once | ORAL | Status: AC
  Administered 2013-04-28: 650 mg via ORAL
  Filled 2013-04-28: qty 2

## 2013-04-28 NOTE — MAU Provider Note (Addendum)
History     CSN: [id number]  Arrival date and time: 04/28/13 1425   None     Chief Complaint  Patient presents with  . Constipation   HPI Taylor Bowen is a [id number] presenting with constipation for the past 4 days. Did pass some stool with delivery. Feels the urge to go, has accompanying abdominal discomfort. Worse with ambulation. Has tried fleets enema, miralax, colace, suppository with no success. Does attest to poor diet intake, taking fibrous foods and leafy green vegetables. Also lacks a lot of water in diet.   Also c/o some intermittent dizziness upon standing quickly and occasional headaches. No LOC no falls  Had IOL for PD and marginal cord. Delivery was uncomplicated NSVD. No tears.    OB History   Grav Para Term Preterm Abortions TAB SAB Ect Mult Living   3 2 2  0 1 1 0 0 0 2      Past Medical History  Diagnosis Date  . Urinary tract infection   . Genital warts     acid treatment in 2007  . Pregnancy induced hypertension   . Constipation     Past Surgical History  Procedure Laterality Date  . Hernia repair      umbilical  . Wisdom tooth extraction      Family History  Problem Relation Age of Onset  . Anesthesia problems Neg Hx   . Other Neg Hx     History  Substance Use Topics  . Smoking status: Former Smoker    Quit date: 02/07/2011  . Smokeless tobacco: Never Used     Comment: in 2011  . Alcohol Use: Yes     Comment: occasional    Allergies: No Known Allergies  Prescriptions prior to admission  Medication Sig Dispense Refill  . ibuprofen (ADVIL) 200 MG tablet Take 3 tablets (600 mg total) by mouth every 6 (six) hours as needed for pain.  30 tablet  1  . neomycin-polymyxin-hydrocortisone (CORTISPORIN) 3.5-10000-1 otic suspension Place 4 drops into the left ear 3 (three) times daily.  10 mL  0  . norethindrone (ORTHO MICRONOR) 0.35 MG tablet Take 1 tablet (0.35 mg total) by mouth daily.  1 Package  11  . Prenatal Vit-Fe Fumarate-FA (GNP  PRENATAL VITAMINS) 28-0.8 MG TABS Take 1 tablet by mouth daily.  60 tablet  2    Review of Systems  Constitutional: Negative for fever, chills and malaise/fatigue.  HENT: Negative for congestion and sore throat.   Eyes: Negative for double vision.  Respiratory: Negative for cough, shortness of breath and wheezing.   Cardiovascular: Negative for chest pain and palpitations.  Gastrointestinal: Positive for nausea, abdominal pain and constipation. Negative for vomiting.  Genitourinary: Negative for dysuria, urgency and frequency.  Musculoskeletal: Negative for myalgias.  Neurological: Positive for headaches. Negative for dizziness and loss of consciousness.  Psychiatric/Behavioral: Negative for depression.   Physical Exam   Blood pressure 130/81, pulse 67, temperature 98.5 F (36.9 C), resp. rate 18, last menstrual period 07/13/2012, unknown if currently breastfeeding.  Physical Exam  Constitutional: She is oriented to person, place, and time. She appears well-developed and well-nourished. No distress.  HENT:  Head: Normocephalic and atraumatic.  Eyes: Pupils are equal, round, and reactive to light.  Neck: Normal range of motion. Neck supple.  Cardiovascular: Normal rate and regular rhythm.   Respiratory: Effort normal and breath sounds normal.  GI: Soft. Bowel sounds are normal. She exhibits no distension. There is tenderness in the left lower quadrant.  Musculoskeletal: Normal range of motion. She exhibits no edema.  Neurological: She is alert and oriented to person, place, and time.  Skin: Skin is warm and dry.    MAU Course  Procedures  MDM Manual disimpactions Soap water enema  Assessment and Plan  27 y.o [id number] here with constipation since delivery 4 days ago. Has tried a host of different tx including laxatives, enemas, stool softners  1. Constipation- manual disimpaction, able to tolerate entire enema with some resolution -per RN patient still with stool burden but  high in the colon unable to be reached further by manual extraction or with further enemas -will send on miralax 1 cap per day as well as colace BID prn for the next 2-3 weeks to establish nml bowel regimen -encourage to increase fluids, fiber in diet -provided with education materials  2. Post-partum care -cont to f/up with routine visits 4-6 weeks  3. Dizziness- hgb stable, blood pressure 130/81 here, not tachy -encourage to increase PO as tolerated -make expect some fluid changes in week post delivery -if continues and does not improve, rtc where hgb and cbg can be checked   Anselm Lis 04/28/2013, 2:49 PM   Called by excellent RN, Belenda Cruise to reassess pt. BP 182/93  S: Pt feeling ok apart from pounding headache, worsening since she has been in the MAU. No changes in vision, no N/V or abdominal pain. Attests to some difficulty with gait in the last several days post-partum but no vertigo no LOC or presyncopal episodes although does attest to some dizziness. Had hx of high BP with previous preg (not on any medications)  BP 182/93  Pulse 51  Temp(Src) 97.4 F (36.3 C) (Oral)  Resp 16  LMP 07/13/2012  Gen: NAD, alert, cooperative with exam HEENT: NCAT, EOMI, PERRL, no nystagmus, vision in tact by confrontation Neck: FROM, supple CV: RRR, good S1/S2, no murmur, cap refill <3 Resp: CTABL, no wheezes, non-labored Abd: SNTND, BS present, no guarding or organomegaly, specifically no murphy's sign no RUQ tenderness Ext: No edema, warm, normal tone, moves UE/LE spontaneously,  Neuro: Alert and oriented, No gross deficits, no clonus DTRs 2+ throughout upper and lower extremities (testing limited by inability to distract pt during exam) Skin: no rashes no lesions  A/P 1. Elevated BP- concern for post-partum pre-eclampsia vs htnsive urgency but exam this afternoon unremarkable. May definitely be the cause of her headaches. Question of hypertensive disorder of pregnancy vs chronic  uncontrolled HTN.  Plan: -CBC with diff -CMET -P/Cr ratio -repeat BP -could consider labetalol vs hydralazine   Charlane Ferretti, MD Family Medicine PGY-1 Please page or call with questions  Pt with nml labs expected in pregnancy. Ast/Alt 32/35. Alk phos 138. Albumin 3. Platelets nml 219 Repeat Bps reassuring BP 147/96  Pulse 60  Temp(Src) 97.4 F (36.3 C) (Oral)  Resp 16  LMP 07/13/2012 Discussed results with pt and mother and instructed her to continue with plan as stated above for her constipation.  Additionally pt will monitor her BPs in the next 1-2 weeks at [facility] and move her post-natal visit to 2 weeks rather than routine 4-6 wk visit. Pt amenable with this plan. Expressed understanding  Charlane Ferretti, MD Family Medicine PGY-1 Please page or call with questions

## 2013-04-28 NOTE — MAU Provider Note (Addendum)
I spoke with and examined patient and agree with resident's note and plan of care.  Cheral Marker, CNM, [facility] 04/28/2013 4:34 PM

## 2013-04-28 NOTE — MAU Note (Signed)
Pt presents with complaints of constipation. She delivered vaginally on September the 30th. She has tried multiple  stool softners and states they havent worked at all.

## 2013-04-28 NOTE — MAU Provider Note (Addendum)
I spoke with and examined patient and agree with resident's note and plan of care.  Pt reports great results from enema.  Discussed bp's, labs w/ Dr. Marice Potter, OK to d/c home. Cheral Marker, CNM, [facility] 04/28/2013 7:09 PM

## 2013-04-30 ENCOUNTER — Encounter (HOSPITAL_COMMUNITY): Payer: Self-pay | Admitting: General Practice

## 2013-04-30 ENCOUNTER — Telehealth: Payer: Self-pay | Admitting: General Practice

## 2013-04-30 ENCOUNTER — Inpatient Hospital Stay (HOSPITAL_COMMUNITY)
Admission: AD | Admit: 2013-04-30 | Discharge: 2013-04-30 | Disposition: A | Discharge status: Home / Self Care | Payer: Medicaid Other | Source: Ambulatory Visit | Attending: Obstetrics & Gynecology | Admitting: Obstetrics & Gynecology

## 2013-04-30 DIAGNOSIS — R51 Headache: Secondary | ICD-10-CM | POA: Insufficient documentation

## 2013-04-30 DIAGNOSIS — IMO0001 Reserved for inherently not codable concepts without codable children: Secondary | ICD-10-CM | POA: Insufficient documentation

## 2013-04-30 DIAGNOSIS — I10 Essential (primary) hypertension: Secondary | ICD-10-CM

## 2013-04-30 DIAGNOSIS — R519 Headache, unspecified: Secondary | ICD-10-CM

## 2013-04-30 DIAGNOSIS — O9089 Other complications of the puerperium, not elsewhere classified: Secondary | ICD-10-CM

## 2013-04-30 DIAGNOSIS — O99893 Other specified diseases and conditions complicating puerperium: Secondary | ICD-10-CM | POA: Insufficient documentation

## 2013-04-30 DIAGNOSIS — R209 Unspecified disturbances of skin sensation: Secondary | ICD-10-CM | POA: Insufficient documentation

## 2013-04-30 LAB — URINALYSIS, ROUTINE W REFLEX MICROSCOPIC
Bilirubin Urine: NEGATIVE
Glucose, UA: NEGATIVE mg/dL
Ketones, ur: NEGATIVE mg/dL
Nitrite: NEGATIVE
Protein, ur: NEGATIVE mg/dL
Specific Gravity, Urine: 1.02 (ref 1.005–1.030)
Urobilinogen, UA: 1 mg/dL (ref 0.0–1.0)
pH: 7.5 (ref 5.0–8.0)

## 2013-04-30 LAB — COMPREHENSIVE METABOLIC PANEL
ALT: 20 U/L (ref 0–35)
AST: 16 U/L (ref 0–37)
Albumin: 2.7 g/dL — ABNORMAL LOW (ref 3.5–5.2)
Alkaline Phosphatase: 102 U/L (ref 39–117)
BUN: 7 mg/dL (ref 6–23)
CO2: 25 mEq/L (ref 19–32)
Calcium: 9 mg/dL (ref 8.4–10.5)
Chloride: 102 mEq/L (ref 96–112)
Creatinine, Ser: 0.7 mg/dL (ref 0.50–1.10)
GFR calc Af Amer: >90 mL/min (ref >90)
GFR calc non Af Amer: >90 mL/min (ref >90)
Glucose, Bld: 90 mg/dL (ref 70–99)
Potassium: 3 mEq/L — ABNORMAL LOW (ref 3.5–5.1)
Sodium: 138 mEq/L (ref 135–145)
Total Bilirubin: 0.2 mg/dL — ABNORMAL LOW (ref 0.3–1.2)
Total Protein: 6.8 g/dL (ref 6.0–8.3)

## 2013-04-30 LAB — URINE MICROSCOPIC-ADD ON

## 2013-04-30 LAB — PROTEIN / CREATININE RATIO, URINE
Creatinine, Urine: 82.43 mg/dL
Protein Creatinine Ratio: 0.08 (ref 0.00–0.15)
Total Protein, Urine: 6.2 mg/dL

## 2013-04-30 LAB — CBC
HCT: 36.3 % (ref 36.0–46.0)
Hemoglobin: 11.8 g/dL — ABNORMAL LOW (ref 12.0–15.0)
MCH: 25.1 pg — ABNORMAL LOW (ref 26.0–34.0)
MCHC: 32.5 g/dL (ref 30.0–36.0)
MCV: 77.1 fL — ABNORMAL LOW (ref 78.0–100.0)
Platelets: 221 10*3/uL (ref 150–400)
RBC: 4.71 MIL/uL (ref 3.87–5.11)
RDW: 14.2 % (ref 11.5–15.5)
WBC: 6.2 10*3/uL (ref 4.0–10.5)

## 2013-04-30 LAB — POCT PREGNANCY, URINE: Preg Test, Ur: POSITIVE — AB

## 2013-04-30 MED ORDER — HYDROCHLOROTHIAZIDE 25 MG PO TABS
25.0000 mg | ORAL_TABLET | Freq: Once | ORAL | Status: AC
  Administered 2013-04-30: 25 mg via ORAL
  Filled 2013-04-30: qty 1

## 2013-04-30 MED ORDER — LABETALOL HCL 300 MG PO TABS: 300.0000 mg | ORAL_TABLET | Freq: Two times a day (BID) | ORAL | Status: DC

## 2013-04-30 MED ORDER — LABETALOL HCL 100 MG PO TABS
300.0000 mg | ORAL_TABLET | Freq: Two times a day (BID) | ORAL | Status: DC
  Administered 2013-04-30: 300 mg via ORAL
  Filled 2013-04-30: qty 3

## 2013-04-30 NOTE — Telephone Encounter (Signed)
Patient called in to front office stating she has been having a pounding headache and tylenol isn't helping, also states she checked her blood pressure earlier today and it was normal. Told patient to try to use excedrin for her headache and if still unrelieved then she should go to MAU for further evaluation. Patient verbalized understanding and had no further questions

## 2013-04-30 NOTE — MAU Note (Signed)
Patient state sshe had a vaginal delivery on 9-30. States she has had a bad headache for several days and has numbness in the left arm and hand.

## 2013-04-30 NOTE — MAU Note (Signed)
Patient states she has intermittent pinching pain in the epigastric area. Patient is not having any pain at this time. No headache or chest pain.

## 2013-04-30 NOTE — MAU Provider Note (Signed)
Chief Complaint:  Numbness and Headache   Taylor Bowen is a 27 y.o.  [id number] who is 6 days post partum who presents  for Numbness and Headache   Pt had IOL for PD. Delivery was uncomplicated NSVD on 9/30. No tears. No complications postpartum.  She had no trouble with blood pressures in pregnancy nor during her delivery or postpartum.    Since being discharged, she noticed that she started getting these sharp strong headaches that are a 10/10 and almost throb. They are sudden onset and then suddenly go away without warning.  She says they seem to be worse with laying or sitting than with standing. Tylenol doesn't work well but she has not tried anything else up until this point.   Was seen in the MAU on 10/4 and at that time was noted to have labile BP with some severe range pressures immediately followed by more high normal pressures. PIH labs at that time were unremarkable but for a clean catch specimen spot prot/cr ratio of 0.75 but with blood on UA. Was sent home but has continued to have headaches that come and go. Checked BP at [facility] a couple times and was normal then but was asymptomatic at that time. Decided to come in today to see if anything else could be done given the chronicity of her headaches.   Still she states they are quick onset and quick offset just like before. No significant change. No vision changes, abd pain, swelling or other symptoms. No fevers, chills, nausea, vomiting, sob, chest pain.     Menstrual History: OB History   Grav Para Term Preterm Abortions TAB SAB Ect Mult Living   3 2 2  0 1 1 0 0 0 2       No LMP recorded.      Past Medical History  Diagnosis Date  . Urinary tract infection   . Genital warts     acid treatment in 2007  . Pregnancy induced hypertension   . Constipation     Past Surgical History  Procedure Laterality Date  . Hernia repair      umbilical  . Wisdom tooth extraction      Family History  Problem Relation Age of Onset   . Anesthesia problems Neg Hx   . Other Neg Hx     History  Substance Use Topics  . Smoking status: Former Smoker    Quit date: 02/07/2011  . Smokeless tobacco: Never Used     Comment: in 2011  . Alcohol Use: Yes     Comment: occasional     No Known Allergies  Prescriptions prior to admission  Medication Sig Dispense Refill  . acetaminophen (TYLENOL) 500 MG tablet Take 1,000 mg by mouth every 6 (six) hours as needed for pain.      Marland Kitchen docusate sodium (COLACE) 100 MG capsule Take 200 mg by mouth 2 (two) times daily.      Marland Kitchen ibuprofen (ADVIL) 200 MG tablet Take 3 tablets (600 mg total) by mouth every 6 (six) hours as needed for pain.  30 tablet  1  . norethindrone (ORTHO MICRONOR) 0.35 MG tablet Take 1 tablet (0.35 mg total) by mouth daily.  1 Package  11  . phenylephrine-shark liver oil-mineral oil-petrolatum (PREPARATION H) 0.25-3-14-71.9 % rectal ointment Place 1 application rectally 2 (two) times daily as needed for hemorrhoids.      . polyethylene glycol powder (GLYCOLAX/MIRALAX) powder Take 17 g by mouth daily as needed (constipation).      Marland Kitchen  Prenatal Vit-Fe Fumarate-FA (GNP PRENATAL VITAMINS) 28-0.8 MG TABS Take 1 tablet by mouth daily.  60 tablet  2    Review of Systems - Negative except for what is mentioned in HPI.  Physical Exam  Blood pressure 177/86, pulse 52, temperature 98.5 F (36.9 C), temperature source Oral, resp. rate 18, height 5' 5.5" (1.664 m), SpO2 99.00%. GENERAL: Well-developed, well-nourished female in no acute distress.  LUNGS: Clear to auscultation bilaterally.  HEART: Regular rate and rhythm. ABDOMEN: Soft, nontender, nondistended, uterine fundus below the umbilicus and firm NEURO: reflexes 2+ but no clonus.  Normal strength.  EXTREMITIES: Nontender, no edema, 2+ distal pulses.   Labs: Results for orders placed during the hospital encounter of 04/30/13 (from the past 24 hour(s))  URINALYSIS, ROUTINE W REFLEX MICROSCOPIC   Collection Time     04/30/13  6:15 PM      Result Value Range   Color, Urine YELLOW  YELLOW   APPearance CLEAR  CLEAR   Specific Gravity, Urine 1.020  1.005 - 1.030   pH 7.5  5.0 - 8.0   Glucose, UA NEGATIVE  NEGATIVE mg/dL   Hgb urine dipstick MODERATE (*) NEGATIVE   Bilirubin Urine NEGATIVE  NEGATIVE   Ketones, ur NEGATIVE  NEGATIVE mg/dL   Protein, ur NEGATIVE  NEGATIVE mg/dL   Urobilinogen, UA 1.0  0.0 - 1.0 mg/dL   Nitrite NEGATIVE  NEGATIVE   Leukocytes, UA TRACE (*) NEGATIVE  URINE MICROSCOPIC-ADD ON   Collection Time    04/30/13  6:15 PM      Result Value Range   Squamous Epithelial / LPF RARE  RARE   WBC, UA 0-2  <3 WBC/hpf   RBC / HPF 3-6  <3 RBC/hpf   Urine-Other MUCOUS PRESENT    POCT PREGNANCY, URINE   Collection Time    04/30/13  6:21 PM      Result Value Range   Preg Test, Ur POSITIVE (*) NEGATIVE    Imaging Studies:  No results found.  Assessment: Taylor Bowen is  27 y.o. [phone] at Unknown presents with Numbness and Headache .  Plan: 1) Headaches - likely due to blood pressure as they directly correlate with times when her BP is greater than 160 systolic.  Even in the MAU, she was symptom free until it reached that point and then her HA would return.  - will plan to treat bp and thus, improve her headaches  2) Hypertension - labile- in the mau ranging from 130s-180s/50s-100s - given a dose of HCTZ initially with plans to send her home on this but then her pressures increased again - PIH labs sent and were normal including her protein/Cr ratio - will plan to discharge hoem on labetalol 300mg  BID with f/u in 2 days for BP check - should her symptoms worsen or change in anyway, advised her to return sooner.    Taylor Bowen L 10/6/20147:44 PM

## 2013-05-01 ENCOUNTER — Encounter: Payer: Self-pay | Admitting: *Deleted

## 2013-05-02 ENCOUNTER — Ambulatory Visit (INDEPENDENT_AMBULATORY_CARE_PROVIDER_SITE_OTHER): Payer: Medicaid Other

## 2013-05-02 DIAGNOSIS — O169 Unspecified maternal hypertension, unspecified trimester: Secondary | ICD-10-CM

## 2013-05-02 NOTE — Progress Notes (Signed)
Pt came today for BP check for elevated BP's post delivery.  Pt informed me that she has not taken her medication because she was unable to pick up cause they had to order the medication.  Pt was to start Labetalol 100 mg po bid.  Informed Dr. Reola Calkins and per provider pt needs to take medication and come back on Friday in the am for repeat BP check after she has taken medication.  Pt stated understanding to these recommendations.

## 2013-05-03 NOTE — MAU Provider Note (Signed)
Attestation of Attending Supervision of Fellow: Evaluation and management procedures were performed by the Fellow under my supervision and collaboration.  I have reviewed the Fellow's note and chart, and I agree with the management and plan.    

## 2013-05-04 ENCOUNTER — Ambulatory Visit: Payer: Medicaid Other | Admitting: General Practice

## 2013-05-04 VITALS — BP 132/84 | HR 73

## 2013-05-04 DIAGNOSIS — Z0130 Encounter for examination of blood pressure without abnormal findings: Secondary | ICD-10-CM

## 2013-05-04 NOTE — Progress Notes (Signed)
Pt. Here today for BP check after starting labetalol on 05/02/13. BP 132/84. Pt. Denies dizziness and headache and has no complaints at this time. Results discussed with MD Marice Potter who stated BP was WNL and pt. Was OK to go and return for postpartum visit on 05/30/13 for postpartum visit.

## 2013-05-14 NOTE — Telephone Encounter (Signed)
Erroneous encounter

## 2013-05-28 ENCOUNTER — Ambulatory Visit: Payer: Medicaid Other | Admitting: Advanced Practice Midwife

## 2013-05-30 ENCOUNTER — Ambulatory Visit: Payer: Medicaid Other | Admitting: Obstetrics & Gynecology

## 2013-06-01 ENCOUNTER — Other Ambulatory Visit (HOSPITAL_COMMUNITY): Payer: Self-pay | Admitting: Family Medicine

## 2013-06-04 ENCOUNTER — Telehealth: Payer: Self-pay

## 2013-06-04 DIAGNOSIS — O139 Gestational [pregnancy-induced] hypertension without significant proteinuria, unspecified trimester: Secondary | ICD-10-CM

## 2013-06-04 MED ORDER — LABETALOL HCL 300 MG PO TABS: 300.0000 mg | ORAL_TABLET | Freq: Two times a day (BID) | ORAL | Status: DC

## 2013-06-04 NOTE — Telephone Encounter (Signed)
Pt needs a refill on her labetalol. She isn't scheduled to followup until 11/12. Rx sent to pharmacy to cover her until she is seen.

## 2013-06-05 ENCOUNTER — Telehealth: Payer: Self-pay | Admitting: *Deleted

## 2013-06-05 NOTE — Telephone Encounter (Signed)
Pt called nurse line requesting precription refill.  Did not leave any other information.

## 2013-06-06 ENCOUNTER — Ambulatory Visit: Payer: Medicaid Other | Admitting: Obstetrics & Gynecology

## 2013-07-04 ENCOUNTER — Inpatient Hospital Stay (HOSPITAL_COMMUNITY)
Admission: AD | Admit: 2013-07-04 | Discharge: 2013-07-04 | Disposition: A | Discharge status: Home / Self Care | Payer: Medicaid Other | Source: Ambulatory Visit | Attending: Obstetrics and Gynecology | Admitting: Obstetrics and Gynecology

## 2013-07-04 DIAGNOSIS — O139 Gestational [pregnancy-induced] hypertension without significant proteinuria, unspecified trimester: Secondary | ICD-10-CM | POA: Insufficient documentation

## 2013-07-04 LAB — URINALYSIS, ROUTINE W REFLEX MICROSCOPIC
Bilirubin Urine: NEGATIVE
Glucose, UA: NEGATIVE mg/dL
Hgb urine dipstick: NEGATIVE
Ketones, ur: NEGATIVE mg/dL
Leukocytes, UA: NEGATIVE
Nitrite: NEGATIVE
Protein, ur: NEGATIVE mg/dL
Specific Gravity, Urine: >1.03 — ABNORMAL HIGH (ref 1.005–1.030)
Urobilinogen, UA: 0.2 mg/dL (ref 0.0–1.0)
pH: 6 (ref 5.0–8.0)

## 2013-07-04 LAB — POCT PREGNANCY, URINE: Preg Test, Ur: NEGATIVE

## 2013-07-04 NOTE — MAU Note (Signed)
Signed in with " bladder infection"; when asked pt about vag d/c- her response was I think I have a bladder infection- I have a lot of d/c.

## 2013-07-04 NOTE — Progress Notes (Signed)
Not in lobby when called 

## 2013-07-04 NOTE — Progress Notes (Signed)
Patient is not in lobby

## 2013-07-04 NOTE — Progress Notes (Signed)
Patient not in lobby

## 2013-07-04 NOTE — MAU Note (Addendum)
On BP  Medicine, felt funny yesterday- numbness in leg (gait is steady).  Went to drug store, took BP, was low x3 (100's /60's)  Delivered 09/30;missed 6 wk  Up , came here in ? Oct- was put on Labetalol. Is almost out of it.  Does not have primary

## 2013-07-05 ENCOUNTER — Encounter (HOSPITAL_COMMUNITY): Payer: Self-pay | Admitting: Emergency Medicine

## 2013-07-05 ENCOUNTER — Emergency Department (HOSPITAL_COMMUNITY)
Admission: EM | Admit: 2013-07-05 | Discharge: 2013-07-05 | Disposition: A | Discharge status: Home / Self Care | Payer: Medicaid Other | Source: Home / Self Care | Attending: Emergency Medicine | Admitting: Emergency Medicine

## 2013-07-05 DIAGNOSIS — R209 Unspecified disturbances of skin sensation: Secondary | ICD-10-CM

## 2013-07-05 DIAGNOSIS — R202 Paresthesia of skin: Secondary | ICD-10-CM

## 2013-07-05 LAB — POCT I-STAT, CHEM 8
BUN: 11 mg/dL (ref 6–23)
Calcium, Ion: 1.33 mmol/L — ABNORMAL HIGH (ref 1.12–1.23)
Chloride: 101 mEq/L (ref 96–112)
Creatinine, Ser: 0.8 mg/dL (ref 0.50–1.10)
Glucose, Bld: 70 mg/dL (ref 70–99)
HCT: 40 % (ref 36.0–46.0)
Hemoglobin: 13.6 g/dL (ref 12.0–15.0)
Potassium: 3.4 mEq/L — ABNORMAL LOW (ref 3.5–5.1)
Sodium: 143 mEq/L (ref 135–145)
TCO2: 30 mmol/L (ref 0–100)

## 2013-07-05 NOTE — ED Notes (Signed)
Pt c/o intermittent left leg numbness onset 3 days States this first happened right after she had her child on 04/24/13... Lasted for 2 days She denies inj/trauma, strenuous activity She is alert w/no signs of acute distress.

## 2013-07-05 NOTE — ED Provider Notes (Signed)
Taylor Bowen is a 27 y.o. female who presents to Urgent Care today for numbness. She has had intermittent numbness since delivery of her child 9/30. She states that she had 2 days of left arm and leg numbness starting 10/1 that then went away. She was seen again 10/6 for numbness and headaches and that had been going on for 5 days, and was found to have very elevated BP. She was started on labetalol and headaches have improved. Numbness stopped at that point but returned again just in left leg 2 days ago and has been relatively constant since then. Numbness is anterior thigh and lateral/posterior lower leg. She denies pain but reports a warm sensation "like the sun is shining on it." Also reports occasional subtle muscle "twitching" in the leg. Denies visual changes, speech difficulty, abnormal gait, or bowel/bladder incontinence. Denies rash, swelling, previous symptoms like this before October, fever, chills, dizziness or fainting.   Of note, felt nauseated yesterday and day before and blood pressure at that time was 100s/60s x 2. She held labetalol last night and this morning and is feeling better from that standpoint.   Past Medical History  Diagnosis Date  . Urinary tract infection   . Genital warts     acid treatment in 2007  . Pregnancy induced hypertension   . Constipation    History  Substance Use Topics  . Smoking status: Former Smoker    Quit date: 02/07/2011  . Smokeless tobacco: Never Used     Comment: in 2011  . Alcohol Use: Yes     Comment: occasional  Etoh occasional  ROS as above Medications reviewed. No current facility-administered medications for this encounter.   Current Outpatient Prescriptions  Medication Sig Dispense Refill  . labetalol (NORMODYNE) 300 MG tablet Take 1 tablet (300 mg total) by mouth 2 (two) times daily.  60 tablet  0  . acetaminophen (TYLENOL) 500 MG tablet Take 1,000 mg by mouth every 6 (six) hours as needed for pain.      Marland Kitchen docusate sodium  (COLACE) 100 MG capsule Take 200 mg by mouth 2 (two) times daily.      Marland Kitchen ibuprofen (ADVIL) 200 MG tablet Take 3 tablets (600 mg total) by mouth every 6 (six) hours as needed for pain.  30 tablet  1  . phenylephrine-shark liver oil-mineral oil-petrolatum (PREPARATION H) 0.25-3-14-71.9 % rectal ointment Place 1 application rectally 2 (two) times daily as needed for hemorrhoids.      . polyethylene glycol powder (GLYCOLAX/MIRALAX) powder Take 17 g by mouth daily as needed (constipation).      NKDA  Exam:  BP 115/68  Pulse 79  Temp(Src) 97.7 F (36.5 C) (Oral)  Resp 16  SpO2 98%  Breastfeeding? No Gen: Well NAD, pleasant HEENT: EOMI,  MMM, sclera clear, EOMI Lungs: Normal work of breathing.  Exts: Non edematous BL  LE, warm and well perfused.  5/5 upper and lower extremity strength NEURO: Awake, normal speech, normal gait, preserved 5/5 strength bilateral upper and lower extremities, CN 2-12 tested and intact, PERRLA,  Decreased sensation to gross touch anterior left thigh, lateral and posterior calf. Normal finger-nose-finger. Neg rhomberg and pronator drift Mildly hyperreflexic throughout.  Results for orders placed during the hospital encounter of 07/05/13 (from the past 24 hour(s))  POCT I-STAT, CHEM 8     Status: Abnormal   Collection Time    07/05/13  3:10 PM      Result Value Range   Sodium 143  135 -  145 mEq/L   Potassium 3.4 (*) 3.5 - 5.1 mEq/L   Chloride 101  96 - 112 mEq/L   BUN 11  6 - 23 mg/dL   Creatinine, Ser 1.61  0.50 - 1.10 mg/dL   Glucose, Bld 70  70 - 99 mg/dL   Calcium, Ion 0.96 (*) 1.12 - 1.23 mmol/L   TCO2 30  0 - 100 mmol/L   Hemoglobin 13.6  12.0 - 15.0 g/dL   HCT 04.5  40.9 - 81.1 %   No results found.  Assessment and Plan: 27 y.o. female with intermittent numbness x 2.5 months. No tenderness/pain, swelling, or other neurologic deficit noted. DDx includes MS (intermittent paresthesias and muscle twitches, but no diplopia, eye pain, abnormal gait),  anxiety, pinched nerve, diabetes, uremia, severe anemia, thyroid disorder. No LE tenderness or inflammation to suggest vasculitis. No pain, erythema, or swelling to suggest DVT. Guillain-Barre possible but unlikely given distribution of paresthesia. - Will check chem 8 today to evaluate glucose, BUN and creatinine - WNL with mild hypokalemia. - Strongly recommending neurology f/u for further workup - provided information for [facility]. - For mild hypotension, recommending pt decrease labetalol to 150mg  BID.  Leona Singleton, MD    Leona Singleton, MD 07/05/13 [id number]

## 2013-07-05 NOTE — ED Provider Notes (Signed)
Medical screening examination/treatment/procedure(s) were performed by a resident physician and as supervising physician I was immediately available for consultation/collaboration.  Additionally, I saw the patient independently, verified the history, examined the patient and discussed the treatment plan with the resident. This patient has isolated paresthesias of her leg, without any other neurological findings. She will need further workup, but does not warrant transfer to the emergency room. We have ruled out diabetes and uremia. Guillain-Barr syndrome is unlikely given normal to hyperreflexic reflexes. Multiple sclerosis is a possibility. She will need a neurologist workup in the near future and have referred her to [facility] neurology to get this done as soon as possible.  Leslee Home, M.D.    Reuben Likes, MD 07/05/13 1556

## 2013-07-10 ENCOUNTER — Encounter: Payer: Self-pay | Admitting: Neurology

## 2013-07-10 ENCOUNTER — Ambulatory Visit (INDEPENDENT_AMBULATORY_CARE_PROVIDER_SITE_OTHER): Payer: Medicaid Other | Admitting: Neurology

## 2013-07-10 ENCOUNTER — Telehealth: Payer: Self-pay | Admitting: *Deleted

## 2013-07-10 VITALS — BP 100/60 | HR 78 | Temp 98.1°F | Ht 65.5 in | Wt 139.0 lb

## 2013-07-10 DIAGNOSIS — R209 Unspecified disturbances of skin sensation: Secondary | ICD-10-CM

## 2013-07-10 DIAGNOSIS — R202 Paresthesia of skin: Secondary | ICD-10-CM

## 2013-07-10 NOTE — Telephone Encounter (Signed)
Pt left message stating that she has been taking Labetalol for the last 2 months due to high blood pressure since her baby was born. She has not taken the medication for the last 3 days because her BP is "so low". She wants to know what she should do. Upon review of pt's EMR, I observed that she was seen @ ED on 12/11 for c/o numbness. Her BP was 115/68. Per d/c instructions, she was advised by Dr. Benjamin Stain to reduce Labetalol to 150 mg po twice daily. (half of her current dose). Also, pt was referred to see neurologist. At the time of this documentation I observed that pt is currently being seen @ [facility] by Dr. Everlena Cooper. I called and spoke with their staff member Jan who informed me that [facility] had already left their office. She provided information that Madi's BP in their office today was 100/60. Kataleena told her as well that she had stopped taking the Labetalol completely and did not want to take it if her BP is low. Clary reported to Jan that she has been checking her BP at [facility] [place]. I called Wilder and discussed her concern. I agreed that she should not take Labetalol with her BP being low. I stated that I would like her to have BP check this week in our clinic so that we may review this history with a provider and get definitive recommendation on her Labetalol. Tylah voiced understanding and stated that she an come for BP check on 12/18 @ 0830.

## 2013-07-10 NOTE — Progress Notes (Addendum)
NEUROLOGY CONSULTATION NOTE  CHARLETTE HENNINGS MRN: [id number] DOB: [birth year]  Referring provider: Urgent Care Primary care provider: Urgent Care  Reason for consult:  Left leg paresthesias.  HISTORY OF PRESENT ILLNESS: Taylor Bowen is a 27 year old right handed woman with pregnancy induced hypertension who presents for left leg numbness.  Records and images were personally reviewed where available.    She has experienced intermittent numbness since she delivered her child on 04/24/13.  Two days after delivery, she developed numbness, described as tingling pins and needles sensation, involving the entire left arm and hand as well as the entire left foot and leg up to the mid-thigh.  There is no associated neck or back radiating down the limb.  There is no associated weakness.  It seemed to be triggered when she was sitting on the toilet bearing down, sitting in a chair or laying back on the couch.  She would have to get up and walk it off, but it usually lasted a couple of hours.  t resolved after a couple of days.  She later developed the numbness again, along with severe pounding headache.  She was found to have elevated blood pressure and was started on labetolol.  Symptoms seemed to have resolved.  However, about a week ago, she developed dizziness and nausea.  She took her blood pressure at [facility] which was in the 100s/60s.  She went to urgent care were she was advised to reduce her labetolol.  She stopped it completely herself.  Since that time, she has had a return of intermittent left leg paresthesias.  She denies vision changes.  She denies previous history of vision problems, focal weakness or numbness.  She does not have a family history of known neurological conditions.  She has a mild dull headache but nothing like her previous headache.  Meanwhile, her baby girl is healthy and doing well.  PAST MEDICAL HISTORY: Past Medical History  Diagnosis Date  . Urinary tract  infection   . Genital warts     acid treatment in 2007  . Pregnancy induced hypertension   . Constipation     PAST SURGICAL HISTORY: Past Surgical History  Procedure Laterality Date  . Hernia repair      umbilical  . Wisdom tooth extraction      MEDICATIONS: Current Outpatient Prescriptions on File Prior to Visit  Medication Sig Dispense Refill  . acetaminophen (TYLENOL) 500 MG tablet Take 1,000 mg by mouth every 6 (six) hours as needed for pain.      Marland Kitchen docusate sodium (COLACE) 100 MG capsule Take 200 mg by mouth 2 (two) times daily.      Marland Kitchen ibuprofen (ADVIL) 200 MG tablet Take 3 tablets (600 mg total) by mouth every 6 (six) hours as needed for pain.  30 tablet  1  . phenylephrine-shark liver oil-mineral oil-petrolatum (PREPARATION H) 0.25-3-14-71.9 % rectal ointment Place 1 application rectally 2 (two) times daily as needed for hemorrhoids.      . polyethylene glycol powder (GLYCOLAX/MIRALAX) powder Take 17 g by mouth daily as needed (constipation).      . labetalol (NORMODYNE) 300 MG tablet Take 1 tablet (300 mg total) by mouth 2 (two) times daily.  60 tablet  0   No current facility-administered medications on file prior to visit.    ALLERGIES: No Known Allergies  FAMILY HISTORY: Family History  Problem Relation Age of Onset  . Anesthesia problems Neg Hx   . Other Neg Hx  SOCIAL HISTORY: History   Social History  . Marital Status: Single    Spouse Name: N/A    Number of Children: N/A  . Years of Education: N/A   Occupational History  . Not on file.   Social History Main Topics  . Smoking status: Former Smoker    Quit date: 02/07/2011  . Smokeless tobacco: Never Used     Comment: in 2011  . Alcohol Use: Yes     Comment: occasional  . Drug Use: No  . Sexual Activity: Not Currently    Birth Control/ Protection: Condom, None   Other Topics Concern  . Not on file   Social History Narrative  . No narrative on file    REVIEW OF  SYSTEMS: Constitutional: No fevers, chills, or sweats, no generalized fatigue, change in appetite Eyes: No visual changes, double vision, eye pain Ear, nose and throat: No hearing loss, ear pain, nasal congestion, sore throat Cardiovascular: No chest pain, palpitations Respiratory:  No shortness of breath at rest or with exertion, wheezes GastrointestinaI: No nausea, vomiting, diarrhea, abdominal pain, fecal incontinence Genitourinary:  No dysuria, urinary retention or frequency Musculoskeletal:  No neck pain, back pain Integumentary: No rash, pruritus, skin lesions Neurological: as above Psychiatric: No depression, insomnia, anxiety Endocrine: No palpitations, fatigue, diaphoresis, mood swings, change in appetite, change in weight, increased thirst Hematologic/Lymphatic:  No anemia, purpura, petechiae. Allergic/Immunologic: no itchy/runny eyes, nasal congestion, recent allergic reactions, rashes  PHYSICAL EXAM: My RN, Jan Stoneman, accompanied me in the room during the exam. Filed Vitals:   07/10/13 1336  BP: 100/60  Pulse: 78  Temp: 98.1 F (36.7 C)   General: No acute distress Head:  Normocephalic/atraumatic Neck: supple, no paraspinal tenderness, full range of motion Back: No paraspinal tenderness Heart: regular rate and rhythm Lungs: Clear to auscultation bilaterally. Vascular: No carotid bruits. Neurological Exam: Mental status: alert and oriented to person, place, and time, speech fluent and not dysarthric, language intact. Cranial nerves: CN I: not tested CN II: pupils equal, round and reactive to light, visual fields intact, fundi unremarkable. CN III, IV, VI:  full range of motion, no nystagmus, no ptosis CN V: facial sensation intact CN VII: upper and lower face symmetric CN VIII: hearing intact CN IX, X: gag intact, uvula midline CN XI: sternocleidomastoid and trapezius muscles intact CN XII: tongue midline Bulk & Tone: normal, no fasciculations. Motor: 5/5  throughout Sensation: endorses approximately 20% reduction in pinprick sensation over dorsal aspect of wrist and forearm.  Otherwise, pinprick and vibration intact. Deep Tendon Reflexes: 2+ throughout, toes down Finger to nose testing: normal Heel to shin: normal Gait: normal stance and stride.  Able to walk on toes, heels and in tandem. Romberg negative.  IMPRESSION: Left-sided paresthesias.  Uncertain of etiology.  Does not seem like typical neuropathy or radiculopathy based on distribution of symptoms.  Does not exhibit any objective UMN findings, but based on age and distribution of paresthesias, a CNS etiology must be ruled out.  PLAN: 1.  MRI of brain and cervical spine w/wo contrast. 2.  Check B12 and TSH 3.  Follow up after tests.  45 minutes spent with patient, over 50% spent counseling and coordinating care.  Thank you for allowing me to take part in the care of this patient.  Shon Millet, DO

## 2013-07-10 NOTE — Patient Instructions (Addendum)
1.  We will get MRI of the brain and cervical spine 2.  We will check vitamin B12 level and thyroid test. 3.  Follow up after the tests.  We will call you with the appointment for your MRI brain and cervical spine as well as the follow up appointment with Dr. Everlena Cooper after these tests are completed.

## 2013-07-11 ENCOUNTER — Encounter: Payer: Self-pay | Admitting: Neurology

## 2013-07-11 LAB — TSH: TSH: 0.68 u[IU]/mL (ref 0.35–5.50)

## 2013-07-11 LAB — VITAMIN B12: Vitamin B-12: 488 pg/mL (ref 211–911)

## 2013-07-12 ENCOUNTER — Ambulatory Visit (INDEPENDENT_AMBULATORY_CARE_PROVIDER_SITE_OTHER): Payer: Medicaid Other

## 2013-07-12 VITALS — BP 121/73 | HR 102

## 2013-07-12 DIAGNOSIS — I10 Essential (primary) hypertension: Secondary | ICD-10-CM

## 2013-07-12 DIAGNOSIS — I959 Hypotension, unspecified: Secondary | ICD-10-CM

## 2013-07-12 LAB — METHYLMALONIC ACID, SERUM: Methylmalonic Acid, Quant: <0.09 umol/L (ref <0.40)

## 2013-07-12 NOTE — Progress Notes (Signed)
Pt came in for a repeat blood pressure after provider has d/c'd her labetalol due to low blood pressure according to pt.  Pt BP WNL today informed Dr. Ike Bene per provider pt is to continue to discontinue use of labetalol and to make sure that she follow's up at her PP visit that is rescheduled of 08/16/13 due to pt cancelling prior appt.  Advised pt to abstain from intercourse cause it may cause her a delay in receiving her BC on the day of her pp visit.  Pt stated understanding.

## 2013-07-20 ENCOUNTER — Ambulatory Visit (HOSPITAL_COMMUNITY)
Admission: RE | Admit: 2013-07-20 | Discharge: 2013-07-20 | Disposition: A | Discharge status: Home / Self Care | Payer: Medicaid Other | Source: Ambulatory Visit | Attending: Neurology | Admitting: Neurology

## 2013-07-20 ENCOUNTER — Ambulatory Visit (HOSPITAL_COMMUNITY): Admission: RE | Admit: 2013-07-20 | Payer: Medicaid Other | Source: Ambulatory Visit

## 2013-07-20 DIAGNOSIS — M47812 Spondylosis without myelopathy or radiculopathy, cervical region: Secondary | ICD-10-CM | POA: Insufficient documentation

## 2013-07-20 DIAGNOSIS — R209 Unspecified disturbances of skin sensation: Secondary | ICD-10-CM | POA: Insufficient documentation

## 2013-07-20 DIAGNOSIS — R202 Paresthesia of skin: Secondary | ICD-10-CM

## 2013-07-20 DIAGNOSIS — M5020 Other cervical disc displacement, unspecified cervical region: Secondary | ICD-10-CM | POA: Insufficient documentation

## 2013-07-20 MED ORDER — GADOBENATE DIMEGLUMINE 529 MG/ML IV SOLN
15.0000 mL | Freq: Once | INTRAVENOUS | Status: AC | PRN
  Administered 2013-07-20: 13 mL via INTRAVENOUS

## 2013-07-23 ENCOUNTER — Telehealth: Payer: Self-pay | Admitting: Neurology

## 2013-07-23 NOTE — Telephone Encounter (Signed)
Message copied by Benay Spice on Mon Jul 23, 2013  8:29 AM ------      Message from: JAFFE, ADAM R      Created: Mon Jul 23, 2013  6:35 AM       MRIs and labs look okay.      ----- Message -----         From: Rad Results In Interface         Sent: 07/21/2013   9:27 AM           To: Cira Servant, DO                   ------

## 2013-07-23 NOTE — Telephone Encounter (Signed)
Called and spoke with the patient. Information given as per Dr. Everlena Cooper below. Reminded of f/u appointment in our office on 08/02/13 at 10:45 am.

## 2013-08-02 ENCOUNTER — Ambulatory Visit: Payer: Medicaid Other | Admitting: Neurology

## 2013-08-16 ENCOUNTER — Ambulatory Visit: Payer: Medicaid Other | Admitting: Obstetrics & Gynecology

## 2013-11-14 ENCOUNTER — Telehealth: Payer: Self-pay | Admitting: *Deleted

## 2013-11-14 ENCOUNTER — Ambulatory Visit: Payer: Medicaid Other | Admitting: Advanced Practice Midwife

## 2013-11-14 NOTE — Telephone Encounter (Signed)
Attempted to contact patient, no answer on 3 different numbers. Left a message that she missed her appointment and to call us to reschedule on her home number.

## 2014-01-16 ENCOUNTER — Inpatient Hospital Stay (HOSPITAL_COMMUNITY)
Admission: AD | Admit: 2014-01-16 | Discharge: 2014-01-16 | Disposition: A | Discharge status: Home / Self Care | Payer: Medicaid Other | Source: Ambulatory Visit | Attending: Obstetrics & Gynecology | Admitting: Obstetrics & Gynecology

## 2014-01-16 ENCOUNTER — Encounter (HOSPITAL_COMMUNITY): Payer: Self-pay | Admitting: *Deleted

## 2014-01-16 DIAGNOSIS — N898 Other specified noninflammatory disorders of vagina: Secondary | ICD-10-CM

## 2014-01-16 DIAGNOSIS — B3731 Acute candidiasis of vulva and vagina: Secondary | ICD-10-CM | POA: Insufficient documentation

## 2014-01-16 DIAGNOSIS — B373 Candidiasis of vulva and vagina: Secondary | ICD-10-CM | POA: Insufficient documentation

## 2014-01-16 DIAGNOSIS — R300 Dysuria: Secondary | ICD-10-CM | POA: Insufficient documentation

## 2014-01-16 DIAGNOSIS — Z87891 Personal history of nicotine dependence: Secondary | ICD-10-CM | POA: Insufficient documentation

## 2014-01-16 LAB — URINALYSIS, ROUTINE W REFLEX MICROSCOPIC
Bilirubin Urine: NEGATIVE
Glucose, UA: NEGATIVE mg/dL
Hgb urine dipstick: NEGATIVE
Ketones, ur: 15 mg/dL — AB
Leukocytes, UA: NEGATIVE
Nitrite: NEGATIVE
Protein, ur: NEGATIVE mg/dL
Specific Gravity, Urine: 1.025 (ref 1.005–1.030)
Urobilinogen, UA: 1 mg/dL (ref 0.0–1.0)
pH: 6 (ref 5.0–8.0)

## 2014-01-16 LAB — WET PREP, GENITAL
Clue Cells Wet Prep HPF POC: NONE SEEN
Trich, Wet Prep: NONE SEEN
Yeast Wet Prep HPF POC: NONE SEEN

## 2014-01-16 LAB — POCT PREGNANCY, URINE: Preg Test, Ur: NEGATIVE

## 2014-01-16 MED ORDER — FLUCONAZOLE 150 MG PO TABS
150.0000 mg | ORAL_TABLET | Freq: Once | ORAL | Status: AC
  Administered 2014-01-16: 150 mg via ORAL
  Filled 2014-01-16: qty 1

## 2014-01-16 NOTE — MAU Provider Note (Signed)
Attestation of Attending Supervision of Advanced Practitioner (CNM/NP): Evaluation and management procedures were performed by the Advanced Practitioner under my supervision and collaboration.  I have reviewed the Advanced Practitioner's note and chart, and I agree with the management and plan.  HARRAWAY-SMITH, CAROLYN 5:00 PM

## 2014-01-16 NOTE — Discharge Instructions (Signed)
Sexually Transmitted Disease A sexually transmitted disease (STD) is a disease or infection often passed to another person during sex. However, STDs can be passed through nonsexual ways. An STD can be passed through:  Spit (saliva).  Semen.  Blood.  Mucus from the vagina.  Pee (urine). HOW CAN I LESSEN MY CHANCES OF GETTING AN STD?  Use:  Latex condoms.  Water-soluble lubricants with condoms. Do not use petroleum jelly or oils.  Dental dams. These are small pieces of latex that are used as a barrier during oral sex.  Avoid having more than one sex partner.  Do not have sex with someone who has other sex partners.  Do not have sex with anyone you do not know or who is at high risk for an STD.  Avoid risky sex that can break your skin.  Do not have sex if you have open sores on your mouth or skin.  Avoid drinking too much alcohol or taking illegal drugs. Alcohol and drugs can affect your good judgment.  Avoid oral and anal sex acts.  Get shots (vaccines) for HPV and hepatitis.  If you are at risk of being infected with HIV, it is advised that you take a certain medicine daily to prevent HIV infection. This is called pre-exposure prophylaxis (PrEP). You may be at risk if:  You are a man who has sex with other men (MSM).  You are attracted to the opposite sex (heterosexual) and are having sex with more than one partner.  You take drugs with a needle.  You have sex with someone who has HIV.  Talk with your doctor about if you are at high risk of being infected with HIV. If you begin to take PrEP, get tested for HIV first. Get tested every 3 months for as long as you are taking PrEP. WHAT SHOULD I DO IF I THINK I HAVE AN STD?  See your doctor.  Tell your sex partner(s) that you have an STD. They should be tested and treated.  Do not have sex until your doctor says it is okay. WHEN SHOULD I GET HELP? Get help right away if:  You have bad belly (abdominal)  pain.  You are a man and have puffiness (swelling) or pain in your testicles.  You are a woman and have puffiness in your vagina. Document Released: 08/19/2004 Document Revised: 07/17/2013 Document Reviewed: 01/05/2013 Exit[employer] Patient Information 2015 [employer]. This information is not intended to replace advice given to you by your health care provider. Make sure you discuss any questions you have with your health care provider.  

## 2014-01-16 NOTE — MAU Note (Signed)
Pt suspects she has UTI, urinary frequency & pain x 1 week, intermittent itching.  Denies bleeding, has creamy discharge, no odor.

## 2014-01-16 NOTE — MAU Provider Note (Signed)
History     CSN: [id number]  Arrival date and time: 01/16/14 1437   First Provider Initiated Contact with Patient 01/16/14 1532      Chief Complaint  Patient presents with  . Dysuria  . Vaginal Discharge   HPI [name] is a 28 y.o. [id number] who presents to MAU today with complaint of dysuria x 1 week and a creamy white discharge without odor. She states discharge is thin and similar to her normal discharge, but more than usual. She denies vaginal bleeding, pelvic pain, flank pain, urinary frequency or urgency or N/V/D. She is sexually active, but states no sex since March.    OB History   Grav Para Term Preterm Abortions TAB SAB Ect Mult Living   3 2 2  0 1 1 0 0 0 2      Past Medical History  Diagnosis Date  . Urinary tract infection   . Genital warts     acid treatment in 2007  . Pregnancy induced hypertension   . Constipation     Past Surgical History  Procedure Laterality Date  . Hernia repair      umbilical  . Wisdom tooth extraction      Family History  Problem Relation Age of Onset  . Anesthesia problems Neg Hx   . Other Neg Hx   . Ataxia Neg Hx   . Chorea Neg Hx   . Dementia Neg Hx   . Mental retardation Neg Hx   . Migraines Neg Hx   . Multiple sclerosis Neg Hx   . Neurofibromatosis Neg Hx   . Neuropathy Neg Hx   . Parkinsonism Neg Hx   . Seizures Neg Hx   . Stroke Neg Hx     History  Substance Use Topics  . Smoking status: Former Smoker    Quit date: 02/07/2011  . Smokeless tobacco: Never Used     Comment: in 2011  . Alcohol Use: Yes     Comment: occasional    Allergies: No Known Allergies  Prescriptions prior to admission  Medication Sig Dispense Refill  . Aspirin-Salicylamide-Caffeine (BC HEADACHE POWDER PO) Take 1 packet by mouth as needed (headace).        Review of Systems  Constitutional: Negative for fever and malaise/fatigue.  Gastrointestinal: Negative for nausea, vomiting, abdominal pain and diarrhea.   Genitourinary: Positive for dysuria and frequency. Negative for urgency, hematuria and flank pain.       Neg - vaginal bleeding + vaginal discharge   Physical Exam   Blood pressure 114/62, pulse 59, temperature 98.1 F (36.7 C), temperature source Oral, resp. rate 16, height 5\' 6"  (1.676 m), weight 138 lb 6.4 oz (62.778 kg), last menstrual period 01/04/2014, SpO2 100.00%.  Physical Exam  Constitutional: She is oriented to person, place, and time. She appears well-developed and well-nourished. No distress.  HENT:  Head: Normocephalic and atraumatic.  Cardiovascular: Normal rate.   Respiratory: Effort normal.  GI: Soft. She exhibits no distension and no mass. There is tenderness (mild tenderness to palpation of the lower abdomen bilaterally). There is no rebound and no guarding.  Genitourinary: Uterus is not enlarged and not tender. Cervix exhibits no motion tenderness, no discharge and no friability. Right adnexum displays no mass and no tenderness. Left adnexum displays no mass and no tenderness. No bleeding around the vagina. Vaginal discharge (small amount of thick, white discharge noted) found.  Neurological: She is alert and oriented to person, place, and time.  Skin: Skin  is warm and dry. No erythema.  Psychiatric: She has a normal mood and affect.   Results for orders placed during the hospital encounter of 01/16/14 (from the past 24 hour(s))  URINALYSIS, ROUTINE W REFLEX MICROSCOPIC     Status: Abnormal   Collection Time    01/16/14  3:00 PM      Result Value Ref Range   Color, Urine YELLOW  YELLOW   APPearance CLEAR  CLEAR   Specific Gravity, Urine 1.025  1.005 - 1.030   pH 6.0  5.0 - 8.0   Glucose, UA NEGATIVE  NEGATIVE mg/dL   Hgb urine dipstick NEGATIVE  NEGATIVE   Bilirubin Urine NEGATIVE  NEGATIVE   Ketones, ur 15 (*) NEGATIVE mg/dL   Protein, ur NEGATIVE  NEGATIVE mg/dL   Urobilinogen, UA 1.0  0.0 - 1.0 mg/dL   Nitrite NEGATIVE  NEGATIVE   Leukocytes, UA NEGATIVE   NEGATIVE  POCT PREGNANCY, URINE     Status: None   Collection Time    01/16/14  3:10 PM      Result Value Ref Range   Preg Test, Ur NEGATIVE  NEGATIVE  WET PREP, GENITAL     Status: Abnormal   Collection Time    01/16/14  3:42 PM      Result Value Ref Range   Yeast Wet Prep HPF POC NONE SEEN  NONE SEEN   Trich, Wet Prep NONE SEEN  NONE SEEN   Clue Cells Wet Prep HPF POC NONE SEEN  NONE SEEN   WBC, Wet Prep HPF POC FEW (*) NONE SEEN    MAU Course  Procedures None  MDM UPT - negative UA, wet prep and GC/Chlamydia today 150 gm Diflucan given in MAU  Assessment and Plan  A: Yeast vulvovaginitis, clinical Dysuria  P: Discharge home Urine culture sent GC/Chlamydia pending Patient may return to MAU as needed or if her condition were to change or worsen   Farris Has, PA-C  01/16/2014, 4:28 PM

## 2014-01-17 LAB — URINE CULTURE: Colony Count: 70000

## 2014-01-17 LAB — GC/CHLAMYDIA PROBE AMP
CT Probe RNA: NEGATIVE
GC Probe RNA: NEGATIVE

## 2014-04-29 ENCOUNTER — Ambulatory Visit: Payer: Self-pay | Admitting: Obstetrics

## 2014-05-02 ENCOUNTER — Ambulatory Visit: Payer: Self-pay | Admitting: Obstetrics

## 2014-05-03 ENCOUNTER — Other Ambulatory Visit (HOSPITAL_COMMUNITY)
Admission: RE | Admit: 2014-05-03 | Discharge: 2014-05-03 | Disposition: A | Discharge status: Home / Self Care | Payer: Medicaid Other | Source: Ambulatory Visit | Attending: Family Medicine | Admitting: Family Medicine

## 2014-05-03 ENCOUNTER — Emergency Department (HOSPITAL_COMMUNITY)
Admission: EM | Admit: 2014-05-03 | Discharge: 2014-05-03 | Disposition: A | Discharge status: Home / Self Care | Payer: Medicaid Other | Source: Home / Self Care | Attending: Family Medicine | Admitting: Family Medicine

## 2014-05-03 ENCOUNTER — Encounter (HOSPITAL_COMMUNITY): Payer: Self-pay | Admitting: Emergency Medicine

## 2014-05-03 DIAGNOSIS — Z113 Encounter for screening for infections with a predominantly sexual mode of transmission: Secondary | ICD-10-CM | POA: Diagnosis not present

## 2014-05-03 DIAGNOSIS — N760 Acute vaginitis: Secondary | ICD-10-CM | POA: Insufficient documentation

## 2014-05-03 DIAGNOSIS — B9689 Other specified bacterial agents as the cause of diseases classified elsewhere: Secondary | ICD-10-CM

## 2014-05-03 DIAGNOSIS — A499 Bacterial infection, unspecified: Secondary | ICD-10-CM

## 2014-05-03 LAB — POCT URINALYSIS DIP (DEVICE)
Bilirubin Urine: NEGATIVE
Glucose, UA: NEGATIVE mg/dL
Hgb urine dipstick: NEGATIVE
Ketones, ur: NEGATIVE mg/dL
Leukocytes, UA: NEGATIVE
Nitrite: NEGATIVE
Protein, ur: NEGATIVE mg/dL
Specific Gravity, Urine: 1.025 (ref 1.005–1.030)
Urobilinogen, UA: 0.2 mg/dL (ref 0.0–1.0)
pH: 6 (ref 5.0–8.0)

## 2014-05-03 LAB — POCT PREGNANCY, URINE: Preg Test, Ur: NEGATIVE

## 2014-05-03 MED ORDER — METRONIDAZOLE 500 MG PO TABS: 500.0000 mg | ORAL_TABLET | Freq: Two times a day (BID) | ORAL | Status: DC

## 2014-05-03 NOTE — ED Notes (Addendum)
Pt  Reports    Symptoms  Of  Vaginal  Discharge         And        Urinary  Tract  Infection          Symptoms  X   5       Days

## 2014-05-03 NOTE — ED Provider Notes (Signed)
CSN: [id number]     Arrival date & time 05/03/14  1523 History   First MD Initiated Contact with Patient 05/03/14 1601     Chief Complaint  Patient presents with  . Vaginal Discharge   (Consider location/radiation/quality/duration/timing/severity/associated sxs/prior Treatment) Patient is a 28 y.o. female presenting with vaginal discharge. The history is provided by the patient.  Vaginal Discharge Quality:  Malodorous and watery Severity:  Mild Onset quality:  Gradual Progression:  Unchanged Chronicity:  New Associated symptoms: no abdominal pain, no dyspareunia and no genital lesions   Risk factors: no PID, no STI and no unprotected sex     Past Medical History  Diagnosis Date  . Urinary tract infection   . Genital warts     acid treatment in 2007  . Pregnancy induced hypertension   . Constipation    Past Surgical History  Procedure Laterality Date  . Hernia repair      umbilical  . Wisdom tooth extraction     Family History  Problem Relation Age of Onset  . Anesthesia problems Neg Hx   . Other Neg Hx   . Ataxia Neg Hx   . Chorea Neg Hx   . Dementia Neg Hx   . Mental retardation Neg Hx   . Migraines Neg Hx   . Multiple sclerosis Neg Hx   . Neurofibromatosis Neg Hx   . Neuropathy Neg Hx   . Parkinsonism Neg Hx   . Seizures Neg Hx   . Stroke Neg Hx    History  Substance Use Topics  . Smoking status: Former Smoker    Quit date: 02/07/2011  . Smokeless tobacco: Never Used     Comment: in 2011  . Alcohol Use: Yes     Comment: occasional   OB History   Grav Para Term Preterm Abortions TAB SAB Ect Mult Living   3 2 2  0 1 1 0 0 0 2     Review of Systems  Constitutional: Negative.   Gastrointestinal: Negative for abdominal pain.  Genitourinary: Positive for vaginal discharge. Negative for urgency, vaginal bleeding, menstrual problem, pelvic pain and dyspareunia.    Allergies  Review of patient's allergies indicates no known allergies.  Home Medications    Prior to Admission medications   Medication Sig Start Date End Date Taking? Authorizing Provider  Aspirin-Salicylamide-Caffeine (BC HEADACHE POWDER PO) Take 1 packet by mouth as needed (headace).    Historical Provider, MD  metroNIDAZOLE (FLAGYL) 500 MG tablet Take 1 tablet (500 mg total) by mouth 2 (two) times daily. 05/03/14   Billy Fischer, MD   BP 120/60  Pulse 78  Temp(Src) 98.6 F (37 C) (Oral)  Resp 18  SpO2 100% Physical Exam  Nursing note and vitals reviewed. Constitutional: She is oriented to person, place, and time. She appears well-developed and well-nourished.  Abdominal: Soft. Bowel sounds are normal. She exhibits no distension. There is no tenderness.  Genitourinary: Uterus normal. Vaginal discharge found.  Neurological: She is alert and oriented to person, place, and time.  Skin: Skin is warm and dry.    ED Course  Procedures (including critical care time) Labs Review Labs Reviewed  POCT URINALYSIS DIP (DEVICE)  POCT PREGNANCY, URINE  CERVICOVAGINAL ANCILLARY ONLY    Imaging Review No results found.   MDM   1. BV (bacterial vaginosis)      Billy Fischer, MD 05/03/14 [phone]

## 2014-05-06 LAB — CERVICOVAGINAL ANCILLARY ONLY
Chlamydia: NEGATIVE
Neisseria Gonorrhea: NEGATIVE
Wet Prep (BD Affirm): NEGATIVE
Wet Prep (BD Affirm): NEGATIVE
Wet Prep (BD Affirm): POSITIVE — AB

## 2014-05-08 NOTE — ED Notes (Signed)
GC/Chlamydia neg., Affirm: Candida and Trich neg., Gardnerella pos.  Pt. adequately treated with Flagyl. Roselyn Meier 05/08/2014

## 2014-05-27 ENCOUNTER — Encounter (HOSPITAL_COMMUNITY): Payer: Self-pay | Admitting: Emergency Medicine

## 2017-05-25 ENCOUNTER — Encounter (HOSPITAL_COMMUNITY): Payer: Self-pay | Admitting: *Deleted

## 2017-05-25 ENCOUNTER — Observation Stay (HOSPITAL_COMMUNITY)
Admission: AD | Admit: 2017-05-25 | Discharge: 2017-05-26 | Disposition: A | Discharge status: Home / Self Care | Payer: Medicaid Other | Source: Ambulatory Visit | Attending: Obstetrics & Gynecology | Admitting: Obstetrics & Gynecology

## 2017-05-25 ENCOUNTER — Observation Stay (HOSPITAL_COMMUNITY): Payer: Medicaid Other

## 2017-05-25 DIAGNOSIS — Z9289 Personal history of other medical treatment: Secondary | ICD-10-CM

## 2017-05-25 DIAGNOSIS — Z87891 Personal history of nicotine dependence: Secondary | ICD-10-CM | POA: Diagnosis not present

## 2017-05-25 DIAGNOSIS — D500 Iron deficiency anemia secondary to blood loss (chronic): Secondary | ICD-10-CM | POA: Diagnosis present

## 2017-05-25 DIAGNOSIS — N938 Other specified abnormal uterine and vaginal bleeding: Secondary | ICD-10-CM | POA: Diagnosis present

## 2017-05-25 DIAGNOSIS — D259 Leiomyoma of uterus, unspecified: Secondary | ICD-10-CM

## 2017-05-25 DIAGNOSIS — Z79899 Other long term (current) drug therapy: Secondary | ICD-10-CM | POA: Insufficient documentation

## 2017-05-25 DIAGNOSIS — D649 Anemia, unspecified: Secondary | ICD-10-CM | POA: Insufficient documentation

## 2017-05-25 DIAGNOSIS — N852 Hypertrophy of uterus: Principal | ICD-10-CM | POA: Insufficient documentation

## 2017-05-25 DIAGNOSIS — R51 Headache: Secondary | ICD-10-CM | POA: Diagnosis not present

## 2017-05-25 HISTORY — DX: Personal history of other medical treatment: Z92.89

## 2017-05-25 LAB — CBC
HCT: 17.1 % — ABNORMAL LOW (ref 36.0–46.0)
Hemoglobin: 5.1 g/dL — CL (ref 12.0–15.0)
MCH: 20.4 pg — ABNORMAL LOW (ref 26.0–34.0)
MCHC: 29.8 g/dL — ABNORMAL LOW (ref 30.0–36.0)
MCV: 68.4 fL — ABNORMAL LOW (ref 78.0–100.0)
Platelets: 298 10*3/uL (ref 150–400)
RBC: 2.5 MIL/uL — ABNORMAL LOW (ref 3.87–5.11)
RDW: 17.7 % — ABNORMAL HIGH (ref 11.5–15.5)
WBC: 5.6 10*3/uL (ref 4.0–10.5)

## 2017-05-25 LAB — URINALYSIS, ROUTINE W REFLEX MICROSCOPIC
Bilirubin Urine: NEGATIVE
Glucose, UA: NEGATIVE mg/dL
Ketones, ur: NEGATIVE mg/dL
Leukocytes, UA: NEGATIVE
Nitrite: NEGATIVE
Protein, ur: NEGATIVE mg/dL
Specific Gravity, Urine: 1.014 (ref 1.005–1.030)
pH: 6 (ref 5.0–8.0)

## 2017-05-25 LAB — ABO/RH: ABO/RH(D): A POS

## 2017-05-25 LAB — PREPARE RBC (CROSSMATCH)

## 2017-05-25 LAB — POCT PREGNANCY, URINE: Preg Test, Ur: NEGATIVE

## 2017-05-25 MED ORDER — PRENATAL MULTIVITAMIN CH: 1.0000 | ORAL_TABLET | Freq: Every day | ORAL | Status: DC

## 2017-05-25 MED ORDER — ACETAMINOPHEN 325 MG PO TABS
650.0000 mg | ORAL_TABLET | Freq: Once | ORAL | Status: AC
  Administered 2017-05-25: 650 mg via ORAL
  Filled 2017-05-25: qty 2

## 2017-05-25 MED ORDER — DIPHENHYDRAMINE HCL 25 MG PO CAPS
25.0000 mg | ORAL_CAPSULE | Freq: Once | ORAL | Status: AC
  Administered 2017-05-25: 25 mg via ORAL
  Filled 2017-05-25: qty 1

## 2017-05-25 MED ORDER — IBUPROFEN 600 MG PO TABS
600.0000 mg | ORAL_TABLET | Freq: Four times a day (QID) | ORAL | Status: DC | PRN
  Administered 2017-05-25: 600 mg via ORAL
  Filled 2017-05-25: qty 1

## 2017-05-25 MED ORDER — SODIUM CHLORIDE 0.9 % IV SOLN
INTRAVENOUS | Status: DC
  Administered 2017-05-25 (×2): via INTRAVENOUS

## 2017-05-25 MED ORDER — SODIUM CHLORIDE 0.9 % IV SOLN
Freq: Once | INTRAVENOUS | Status: AC
  Administered 2017-05-25: 19:00:00 via INTRAVENOUS

## 2017-05-25 MED ORDER — ZOLPIDEM TARTRATE 5 MG PO TABS: 5.0000 mg | ORAL_TABLET | Freq: Every evening | ORAL | Status: DC | PRN

## 2017-05-25 NOTE — MAU Note (Signed)
Really just came because she needs to get her BP checked.  Head has been pounding last 2 days.  Normally when it feels like this, her BP is up. Has fibroids, so she bleeds a lot.  Is on cycle, has a hx of anemia, been passing large clots.  Every time she swallows it feels like there is a knot in her throat, this has been going on for about 3 wks.

## 2017-05-25 NOTE — MAU Provider Note (Signed)
History     CSN: [id number]  Arrival date and time: 05/25/17 1420   None     Chief Complaint  Patient presents with  . Dysphagia  . Vaginal Bleeding  . Headache   HPI Taylor Bowen 31 y.o. Has been having heavy vaginal bleeding for 10 days.  Is much less today.  Has had a pounding headache today and thought her blood pressure was elevated.  [facility] and was told her uterus was slightly enlarged at 6 week size.  Did not have ultrasound.  Has noticed her uterus has continued to grow.  Has had irregular but heavy vaginal bleeding 1-2 times a month.  Has been busy with work and two children.  Knew she should come in and be checked but has not.  Today HGB is 5.  OB History    Gravida Para Term Preterm AB Living   3 2 2  0 1 2   SAB TAB Ectopic Multiple Live Births   0 1 0 0 2      Past Medical History:  Diagnosis Date  . Constipation   . Genital warts    acid treatment in 2007  . Pregnancy induced hypertension   . Urinary tract infection     Past Surgical History:  Procedure Laterality Date  . HERNIA REPAIR     umbilical  . WISDOM TOOTH EXTRACTION      Family History  Problem Relation Age of Onset  . Anesthesia problems Neg Hx   . Other Neg Hx   . Ataxia Neg Hx   . Chorea Neg Hx   . Dementia Neg Hx   . Mental retardation Neg Hx   . Migraines Neg Hx   . Multiple sclerosis Neg Hx   . Neurofibromatosis Neg Hx   . Neuropathy Neg Hx   . Parkinsonism Neg Hx   . Seizures Neg Hx   . Stroke Neg Hx     Social History  Substance Use Topics  . Smoking status: Former Smoker    Quit date: 02/07/2011  . Smokeless tobacco: Never Used     Comment: in 2011  . Alcohol use Yes     Comment: occasional    Allergies: No Known Allergies  Prescriptions Prior to Admission  Medication Sig Dispense Refill Last Dose  . Aspirin-Salicylamide-Caffeine (BC HEADACHE POWDER PO) Take 1 packet by mouth as needed (headace).   Past Week at Unknown time   . metroNIDAZOLE (FLAGYL) 500 MG tablet Take 1 tablet (500 mg total) by mouth 2 (two) times daily. 14 tablet 0     Review of Systems Physical Exam   Blood pressure (!) 122/58, pulse 97, temperature 98.4 F (36.9 C), temperature source Oral, resp. rate 18, weight 142 lb 12 oz (64.8 kg), last menstrual period 05/14/2017, SpO2 95 %.  Physical Exam  Nursing note and vitals reviewed. Constitutional: She is oriented to person, place, and time. She appears well-developed and well-nourished.  HENT:  Head: Normocephalic.  Eyes: EOM are normal.  Neck: Neck supple.  GI: Soft. There is no tenderness. There is no rebound and no guarding.  Fundus just under umbilicus  Musculoskeletal: Normal range of motion.  Neurological: She is alert and oriented to person, place, and time.  Skin: Skin is warm and dry.  Psychiatric: She has a normal mood and affect.    MAU Course  Procedures  MDM Consult with Dr. Roselie Awkward.  Will plan to admit for observation and for blood  transfusion of 2 units.  Headache is resolved when she is lying down.  Will continue bedrest for now.  Assessment and Plan  Enlarged uterus - 19 week size Abnormal vaginal bleeding  Severe anemia - HGB 5  Plan Consult with Dr. Roselie Awkward - he put in orders and will admit for observation and for blood transfusion  Terri L Burleson 05/25/2017, 3:52 PM

## 2017-05-25 NOTE — Progress Notes (Signed)
CRITICAL VALUE ALERT  Critical Value:  Hgb  5.1 Date & Time Notied:  05/25/2017 1530  Provider Notified: Hansel Feinstein CNM  Orders Received/Actions taken: no new orders, plan being discussed.  Pt brought to rm from lobby.

## 2017-05-26 DIAGNOSIS — N852 Hypertrophy of uterus: Secondary | ICD-10-CM | POA: Diagnosis not present

## 2017-05-26 DIAGNOSIS — D500 Iron deficiency anemia secondary to blood loss (chronic): Secondary | ICD-10-CM

## 2017-05-26 DIAGNOSIS — D259 Leiomyoma of uterus, unspecified: Secondary | ICD-10-CM

## 2017-05-26 LAB — TYPE AND SCREEN
ABO/RH(D): A POS
Antibody Screen: NEGATIVE
Unit division: 0
Unit division: 0
Unit division: 0
Unit division: 0

## 2017-05-26 LAB — CBC WITH DIFFERENTIAL/PLATELET
Basophils Absolute: 0 10*3/uL (ref 0.0–0.1)
Basophils Relative: 1 %
Eosinophils Absolute: 0.1 10*3/uL (ref 0.0–0.7)
Eosinophils Relative: 1 %
HCT: 21.4 % — ABNORMAL LOW (ref 36.0–46.0)
Hemoglobin: 6.6 g/dL — CL (ref 12.0–15.0)
Lymphocytes Relative: 41 %
Lymphs Abs: 2 10*3/uL (ref 0.7–4.0)
MCH: 22.6 pg — ABNORMAL LOW (ref 26.0–34.0)
MCHC: 30.8 g/dL (ref 30.0–36.0)
MCV: 73.3 fL — ABNORMAL LOW (ref 78.0–100.0)
Monocytes Absolute: 0.7 10*3/uL (ref 0.1–1.0)
Monocytes Relative: 13 %
Neutro Abs: 2.2 10*3/uL (ref 1.7–7.7)
Neutrophils Relative %: 45 %
Platelets: 253 10*3/uL (ref 150–400)
RBC: 2.92 MIL/uL — ABNORMAL LOW (ref 3.87–5.11)
RDW: 19.6 % — ABNORMAL HIGH (ref 11.5–15.5)
WBC: 5 10*3/uL (ref 4.0–10.5)

## 2017-05-26 LAB — BPAM RBC
Blood Product Expiration Date: 201811112359
Blood Product Expiration Date: 201811122359
Blood Product Expiration Date: 201811212359
Blood Product Expiration Date: 201811212359
ISSUE DATE / TIME: 201810311920
ISSUE DATE / TIME: 201810312246
Unit Type and Rh: 600
Unit Type and Rh: 6200
Unit Type and Rh: 6200
Unit Type and Rh: 6200

## 2017-05-26 MED ORDER — PRENATAL MULTIVITAMIN CH: 1.0000 | ORAL_TABLET | Freq: Every day | ORAL | 2 refills | Status: DC

## 2017-05-26 MED ORDER — MEDROXYPROGESTERONE ACETATE 10 MG PO TABS
20.0000 mg | ORAL_TABLET | Freq: Every day | ORAL | Status: DC
  Administered 2017-05-26: 20 mg via ORAL
  Filled 2017-05-26 (×2): qty 2

## 2017-05-26 MED ORDER — MEDROXYPROGESTERONE ACETATE 10 MG PO TABS: 20.0000 mg | ORAL_TABLET | Freq: Every day | ORAL | 2 refills | Status: DC

## 2017-05-26 MED ORDER — FERROUS SULFATE 325 (65 FE) MG PO TABS: 325.0000 mg | ORAL_TABLET | Freq: Two times a day (BID) | ORAL | 3 refills | Status: DC

## 2017-05-26 MED ORDER — IBUPROFEN 600 MG PO TABS: 600.0000 mg | ORAL_TABLET | Freq: Four times a day (QID) | ORAL | 0 refills | Status: DC | PRN

## 2017-05-26 NOTE — Progress Notes (Signed)
CRITICAL VALUE ALERT  Critical Value:  Hg - 6.6   Date & Time Notied:  05/26/17 @ 7471  Provider Notified: Dr. Garlan Fillers -  05/26/17 @ 0551  Orders Received/Actions taken: Will monitor, actual improvement from admission Hg.

## 2017-05-26 NOTE — H&P (Signed)
History   CSN: [id number]  Arrival date and time: 05/25/17 1420   None        Chief Complaint  Patient presents with  . Dysphagia  . Vaginal Bleeding  . Headache   HPI Taylor Bowen 31 y.o. Has been having heavy vaginal bleeding for 10 days.  Is much less today.  Has had a pounding headache today and thought her blood pressure was elevated.  [facility] Department in [facility] and was told her uterus was slightly enlarged at 6 week size.  Did not have ultrasound.  Has noticed her uterus has continued to grow.  Has had irregular but heavy vaginal bleeding 1-2 times a month.  Has been busy with work and two children.  Knew she should come in and be checked but has not.  Today HGB is 5.          OB History    Gravida Para Term Preterm AB Living   3 2 2  0 1 2   SAB TAB Ectopic Multiple Live Births   0 1 0 0 2          Past Medical History:  Diagnosis Date  . Constipation   . Genital warts    acid treatment in 2007  . Pregnancy induced hypertension   . Urinary tract infection          Past Surgical History:  Procedure Laterality Date  . HERNIA REPAIR     umbilical  . WISDOM TOOTH EXTRACTION           Family History  Problem Relation Age of Onset  . Anesthesia problems Neg Hx   . Other Neg Hx   . Ataxia Neg Hx   . Chorea Neg Hx   . Dementia Neg Hx   . Mental retardation Neg Hx   . Migraines Neg Hx   . Multiple sclerosis Neg Hx   . Neurofibromatosis Neg Hx   . Neuropathy Neg Hx   . Parkinsonism Neg Hx   . Seizures Neg Hx   . Stroke Neg Hx            Social History  Substance Use Topics  . Smoking status: Former Smoker    Quit date: 02/07/2011  . Smokeless tobacco: Never Used     Comment: in 2011  . Alcohol use Yes      Comment: occasional    Allergies: No Known Allergies         Prescriptions Prior to Admission  Medication Sig Dispense Refill Last Dose  . Aspirin-Salicylamide-Caffeine (BC  HEADACHE POWDER PO) Take 1 packet by mouth as needed (headace).   Past Week at Unknown time  . metroNIDAZOLE (FLAGYL) 500 MG tablet Take 1 tablet (500 mg total) by mouth 2 (two) times daily. 14 tablet 0     Review of Systems Physical Exam   Blood pressure (!) 122/58, pulse 97, temperature 98.4 F (36.9 C), temperature source Oral, resp. rate 18, weight 142 lb 12 oz (64.8 kg), last menstrual period 05/14/2017, SpO2 95 %.  Physical Exam  Nursing note and vitals reviewed. Constitutional: She is oriented to person, place, and time. She appears well-developed and well-nourished.  HENT:  Head: Normocephalic.  Eyes: EOM are normal.  Neck: Neck supple.  GI: Soft. There is no tenderness. There is no rebound and no guarding.  Fundus just under umbilicus  Musculoskeletal: Normal range of motion.  Neurological: She is alert and oriented to person, place, and time.  Skin: Skin is warm and dry.  Psychiatric: She has a normal mood and affect.    MAU Course  Procedures  MDM Consult with Dr. Roselie Awkward.  Will plan to admit for observation and for blood transfusion of 2 units.  Headache is resolved when she is lying down.  Will continue bedrest for now.  Assessment and Plan  Enlarged uterus - 19 week size Abnormal vaginal bleeding  Severe anemia - HGB 5  Plan Consult with Dr. Roselie Awkward - he put in orders and will admit for observation and for blood transfusion  [name] 05/25/2017, 3:52 PM  Attestation of Attending Supervision of Advanced Practitioner (CNM/NP/PA): Evaluation and management procedures were performed by the Advanced Practitioner under my supervision and collaboration. I have reviewed the Advanced Practitioner's note and chart, and I agree with the management and plan. I examined the patient and discussed the plan.  Emeterio Reeve MD

## 2017-05-26 NOTE — Discharge Summary (Signed)
Physician Discharge Summary  Patient ID: ZOII FLORER MRN: [id number] DOB/AGE: 03/12/1986 31 y.o.  Admit date: 05/25/2017 Discharge date: 05/26/2017  Admission Diagnoses:symptomatic anemia  Discharge Diagnoses:  Active Problems:   Anemia due to chronic blood loss   Fibroid uterus DUB  Discharged Condition: fair  Hospital Course:  History   CSN: [id number]  Arrival date and time: 05/25/17 1420   None        Chief Complaint  Patient presents with  . Dysphagia  . Vaginal Bleeding  . Headache   HPI Taylor Bowen 31 y.o. Has been having heavy vaginal bleeding for 10 days.  Is much less today.  Has had a pounding headache today and thought her blood pressure was elevated.  Went to the health Department in March and was told her uterus was slightly enlarged at 6 week size.  Did not have ultrasound.  Has noticed her uterus has continued to grow.  Has had irregular but heavy vaginal bleeding 1-2 times a month.  Has been busy with work and two children.  Knew she should come in and be checked but has not.  Today HGB is 5.          OB History    Gravida Para Term Preterm AB Living   3 2 2  0 1 2   SAB TAB Ectopic Multiple Live Births   0 1 0 0 2          Past Medical History:  Diagnosis Date  . Constipation   . Genital warts    acid treatment in 2007  . Pregnancy induced hypertension   . Urinary tract infection          Past Surgical History:  Procedure Laterality Date  . HERNIA REPAIR     umbilical  . WISDOM TOOTH EXTRACTION           Family History  Problem Relation Age of Onset  . Anesthesia problems Neg Hx   . Other Neg Hx   . Ataxia Neg Hx   . Chorea Neg Hx   . Dementia Neg Hx   . Mental retardation Neg Hx   . Migraines Neg Hx   . Multiple sclerosis Neg Hx   . Neurofibromatosis Neg Hx   . Neuropathy Neg Hx   . Parkinsonism Neg Hx   . Seizures Neg Hx   . Stroke Neg Hx            Social History   Substance Use Topics  . Smoking status: Former Smoker    Quit date: 02/07/2011  . Smokeless tobacco: Never Used     Comment: in 2011  . Alcohol use Yes      Comment: occasional    Allergies: No Known Allergies         Prescriptions Prior to Admission  Medication Sig Dispense Refill Last Dose  . Aspirin-Salicylamide-Caffeine (BC HEADACHE POWDER PO) Take 1 packet by mouth as needed (headace).   Past Week at Unknown time  . metroNIDAZOLE (FLAGYL) 500 MG tablet Take 1 tablet (500 mg total) by mouth 2 (two) times daily. 14 tablet 0     Review of Systems Physical Exam   Blood pressure (!) 122/58, pulse 97, temperature 98.4 F (36.9 C), temperature source Oral, resp. rate 18, weight 142 lb 12 oz (64.8 kg), last menstrual period 05/14/2017, SpO2 95 %.  Physical Exam  Nursing note and vitals reviewed. Constitutional: She is oriented to person, place, and time. She appears well-developed and  well-nourished.  HENT:  Head: Normocephalic.  Eyes: EOM are normal.  Neck: Neck supple.  GI: Soft. There is no tenderness. There is no rebound and no guarding.  Fundus just under umbilicus  Musculoskeletal: Normal range of motion.  Neurological: She is alert and oriented to person, place, and time.  Skin: Skin is warm and dry.  Psychiatric: She has a normal mood and affect.    MAU Course  Procedures  MDM Consult with Dr. Roselie Awkward.  Will plan to admit for observation and for blood transfusion of 2 units.  Headache is resolved when she is lying down.  Will continue bedrest for now.  Assessment and Plan  Enlarged uterus - 19 week size Abnormal vaginal bleeding  Severe anemia - HGB 5  Plan Consult with Dr. Roselie Awkward - he put in orders and will admit for observation and for blood transfusion  [name] 05/25/2017, 3:52 PM  Patient was admitted for transfusion and evaluation with pelvic US. She felt better after transfusion of 2 units PRBC with a HB of 6.6.  Bleeding was minimal at the time of discharge.  Consults: None  Significant Diagnostic Studies: labs: CBC and radiology: Ultrasound: pelvicvic CLINICAL DATA:  31 year old with an approximate 2 week history of dysfunctional uterine bleeding associated with large clots, now with severe anemia (hemoglobin 5.1).  EXAM: TRANSABDOMINAL ULTRASOUND OF PELVIS  TECHNIQUE: Transabdominal ultrasound examination of the pelvis was performed including evaluation of the uterus, ovaries, adnexal regions, and pelvic cul-de-sac.  COMPARISON:  All of the patient's prior pelvic ultrasounds in 2008 and in 2014 were obstetrical ultrasounds. Therefore, no comparable study is available.  FINDINGS: Uterus  Measurements: Approximately 15.0 x 9.7 x 12.1 cm. Large mass demonstrating color Doppler flow encompassing much of the central uterus such that it is difficult to determine if the mass arises from the myometrium or the endometrium, measuring approximately 10.5 x 7.0 x 10.9 cm.  Endometrium  See above comments regarding the large mass.  Right ovary  Measurements: Approximately 4.9 x 3.3 x 4.1 cm. Anechoic mass indicating a simple cyst measuring approximately 2.5 x 3.3 x 3.4 cm. No solid ovarian mass. Normal color Doppler flow.  Left ovary  Measurements: Approximately 4.3 x 1.5 x 2.6 cm. Small follicular cysts. No dominant cyst or solid mass. Normal color Doppler flow within the ovary.  Other findings:  No abnormal free fluid.  IMPRESSION: 1. Approximate 11 cm mass encompassing much of the central uterus such that it is difficult to determine if the mass arises from the myometrium or the endometrium. I reviewed the patient's prior obstetrical ultrasounds from 2014 and 2008, but the mass was not imaged on any of those examinations. 2. Normal-appearing ovaries.   Electronically Signed   By: Evangeline Dakin M.D.   On: 05/25/2017 18:41  Treatments: IV hydration and  transfusion  Discharge Exam: Blood pressure (!) 95/52, pulse 75, temperature 98.5 F (36.9 C), temperature source Oral, resp. rate 16, height 5\' 6"  (1.676 m), weight 142 lb 12 oz (64.8 kg), last menstrual period 05/14/2017, SpO2 100 %. Ultrasound: see result Abdomen with pelvic mass to the umbilicus Disposition: 21-JHER or Self Care    No current facility-administered medications on file prior to encounter.    Current Outpatient Prescriptions on File Prior to Encounter  Medication Sig Dispense Refill  . Aspirin-Salicylamide-Caffeine (BC HEADACHE POWDER PO) Take 1 packet by mouth daily as needed (headace).      Current Facility-Administered Medications:  .  ibuprofen (ADVIL,MOTRIN) tablet 600 mg,  600 mg, Oral, Q6H PRN, Donnamae Jude, MD, 600 mg at 05/25/17 2044 .  medroxyPROGESTERone (PROVERA) tablet 20 mg, 20 mg, Oral, Daily, Woodroe Mode, MD .  prenatal multivitamin tablet 1 tablet, 1 tablet, Oral, Q1200, Woodroe Mode, MD .  zolpidem [facility]) tablet 5 mg, 5 mg, Oral, QHS PRN, Woodroe Mode, MD   Follow-up [place] for [facility]. Schedule an appointment as soon as possible for a visit in 2 week(s).   Specialty:  Obstetrics and Gynecology Why:  f/u transfusion and fibroid Contact information: [place] [phone]          Signed: Emeterio Reeve 05/26/2017, 8:58 AM

## 2017-05-26 NOTE — Plan of Care (Signed)
Problem: Pain Managment: Goal: General experience of comfort will improve Outcome: Completed/Met Date Met: 05/26/17 Patient denies pain   Problem: Physical Regulation: Goal: Ability to maintain clinical measurements within normal limits will improve Outcome: Progressing Continuing to monitor CBC Goal: Will remain free from infection Outcome: Progressing No signs of infection.  Patient educated regarding post-transfusion reaction.   Problem: Tissue Perfusion: Goal: Risk factors for ineffective tissue perfusion will decrease Outcome: Progressing No signs of ineffective tissue perfusion.  SCD's in room.  Patient educated regarding use of SCDs   Problem: Nutrition: Goal: Adequate nutrition will be maintained Outcome: Progressing Patient maintaining adequate nutrition status with regular diet.

## 2017-05-26 NOTE — Discharge Instructions (Signed)

## 2017-05-26 NOTE — Progress Notes (Signed)
Discharge instructions given to patient.  Follow up appointments and medications discussed.  Signs and symptoms of anemia reviewed.  Patient verbalizes understanding.  Patient signed paperwork,.

## 2017-05-27 ENCOUNTER — Telehealth: Payer: Self-pay | Admitting: *Deleted

## 2017-05-27 NOTE — Telephone Encounter (Signed)
Taylor Bowen called last night and left a voice message that she had been in the hospital yesterday and got a blood transfusion and then was discharged. She reports she has been having chest pain , trouble breathing when she lies down.  I called Niema and left a message we got her message and if she is still having urgent issue to go to MAU, other questions please call us- but be aware we close  At 12 noon today.

## 2017-06-08 ENCOUNTER — Encounter: Payer: Self-pay | Admitting: Obstetrics & Gynecology

## 2017-06-08 ENCOUNTER — Ambulatory Visit (INDEPENDENT_AMBULATORY_CARE_PROVIDER_SITE_OTHER): Payer: Self-pay | Admitting: Obstetrics & Gynecology

## 2017-06-08 VITALS — BP 134/72 | HR 69 | Wt 147.7 lb

## 2017-06-08 DIAGNOSIS — Z029 Encounter for administrative examinations, unspecified: Secondary | ICD-10-CM

## 2017-06-08 DIAGNOSIS — Z23 Encounter for immunization: Secondary | ICD-10-CM

## 2017-06-08 DIAGNOSIS — D500 Iron deficiency anemia secondary to blood loss (chronic): Secondary | ICD-10-CM

## 2017-06-08 DIAGNOSIS — D219 Benign neoplasm of connective and other soft tissue, unspecified: Secondary | ICD-10-CM

## 2017-06-08 DIAGNOSIS — Z0000 Encounter for general adult medical examination without abnormal findings: Secondary | ICD-10-CM

## 2017-06-08 DIAGNOSIS — N921 Excessive and frequent menstruation with irregular cycle: Secondary | ICD-10-CM

## 2017-06-08 MED ORDER — MISOPROSTOL 200 MCG PO TABS: ORAL_TABLET | ORAL | 0 refills | Status: DC

## 2017-06-08 NOTE — Progress Notes (Signed)
   Subjective:    Patient ID: Taylor Bowen, female    DOB: 04-10-1986, 31 y.o.   MRN: [id number]  HPI 31 yo Single P2 (78 and 17 yo kids) here today with her significant other as follow up after being admitted to [facility] for transfusion due to a menorrhagia with a hemoglobin of 5. Her u/s showed a large fibroid. She is still bleeding.   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

## 2017-06-08 NOTE — Addendum Note (Signed)
Addended by: [name] on: 06/08/2017 04:21 PM   Modules accepted: Orders

## 2017-06-13 ENCOUNTER — Encounter: Payer: Self-pay | Admitting: Obstetrics & Gynecology

## 2017-06-13 ENCOUNTER — Telehealth: Payer: Self-pay | Admitting: Obstetrics & Gynecology

## 2017-06-13 NOTE — Telephone Encounter (Signed)
Patient stated her FMLA was not filled out correctly. Requesting a call back no later than tomorrow to answer questions she has.

## 2017-06-20 ENCOUNTER — Ambulatory Visit: Payer: Self-pay | Admitting: Obstetrics & Gynecology

## 2017-06-20 ENCOUNTER — Encounter (HOSPITAL_COMMUNITY): Payer: Self-pay

## 2017-06-20 ENCOUNTER — Encounter: Payer: Self-pay | Admitting: Obstetrics & Gynecology

## 2017-06-20 ENCOUNTER — Other Ambulatory Visit (HOSPITAL_COMMUNITY)
Admission: RE | Admit: 2017-06-20 | Discharge: 2017-06-20 | Disposition: A | Discharge status: Home / Self Care | Payer: Medicaid Other | Source: Ambulatory Visit | Attending: Obstetrics & Gynecology | Admitting: Obstetrics & Gynecology

## 2017-06-20 VITALS — BP 119/68 | HR 69 | Wt 145.0 lb

## 2017-06-20 DIAGNOSIS — N938 Other specified abnormal uterine and vaginal bleeding: Secondary | ICD-10-CM | POA: Diagnosis not present

## 2017-06-20 DIAGNOSIS — Z029 Encounter for administrative examinations, unspecified: Secondary | ICD-10-CM

## 2017-06-20 LAB — POCT PREGNANCY, URINE: Preg Test, Ur: NEGATIVE

## 2017-06-20 NOTE — Progress Notes (Signed)
   Subjective:    Patient ID: Taylor Bowen, female    DOB: 1986/07/12, 31 y.o.   MRN: [id number]  HPI    [name], MD  Physician  Obstetrics/Gynecology  Progress Notes  Signed  Encounter Date:  06/08/2017          Signed            [] Hide copied text  [] Hover for details     Subjective:    Patient ID: Taylor Bowen, female    DOB: April 07, 1986, 31 y.o.   MRN: [id number]  HPI 31 yo Single P2 (22 and 83 yo kids) here today with her significant other as follow up after being admitted to [facility] for transfusion due to a menorrhagia with a hemoglobin of 5. Her u/s showed a large fibroid. She is still bleeding. She is here for a EMBX,             Review of Systems     Objective:   Physical Exam   UPT negative, consent signed, time out done Cervix prepped with betadine and grasped with a single tooth tenaculum Uterus sounded to 9 cm Pipelle used for 2 passes with a moderate amount of tissue obtained. She tolerated the procedure well.   Assessment & Plan:  Fatigue- check CBC 10 cm fibroid and desire for future pregnancy. We have discussed options of [place] and myomectomy. She opts for the myomectomy. We have discussed the need for future cesareans prn as well as potential scarring with infertility. I sent Drema Balzarine an email to schedule this.

## 2017-06-21 LAB — CBC
Hematocrit: 35.1 % (ref 34.0–46.6)
Hemoglobin: 10.6 g/dL — ABNORMAL LOW (ref 11.1–15.9)
MCH: 24.4 pg — ABNORMAL LOW (ref 26.6–33.0)
MCHC: 30.2 g/dL — ABNORMAL LOW (ref 31.5–35.7)
MCV: 81 fL (ref 79–97)
Platelets: 419 10*3/uL — ABNORMAL HIGH (ref 150–379)
RBC: 4.35 x10E6/uL (ref 3.77–5.28)
RDW: 24.6 % — ABNORMAL HIGH (ref 12.3–15.4)
WBC: 5.3 10*3/uL (ref 3.4–10.8)

## 2017-06-22 ENCOUNTER — Encounter: Payer: Self-pay | Admitting: General Practice

## 2017-06-29 ENCOUNTER — Encounter: Payer: Self-pay | Admitting: Obstetrics & Gynecology

## 2017-07-05 ENCOUNTER — Encounter: Payer: Self-pay | Admitting: *Deleted

## 2017-07-14 ENCOUNTER — Emergency Department (HOSPITAL_COMMUNITY)
Admission: EM | Admit: 2017-07-14 | Discharge: 2017-07-14 | Disposition: A | Discharge status: Home / Self Care | Payer: Medicaid Other | Attending: Emergency Medicine | Admitting: Emergency Medicine

## 2017-07-14 ENCOUNTER — Other Ambulatory Visit: Payer: Self-pay

## 2017-07-14 ENCOUNTER — Encounter (HOSPITAL_COMMUNITY): Payer: Self-pay | Admitting: Emergency Medicine

## 2017-07-14 DIAGNOSIS — J392 Other diseases of pharynx: Secondary | ICD-10-CM | POA: Insufficient documentation

## 2017-07-14 DIAGNOSIS — Z79899 Other long term (current) drug therapy: Secondary | ICD-10-CM | POA: Insufficient documentation

## 2017-07-14 DIAGNOSIS — Z87891 Personal history of nicotine dependence: Secondary | ICD-10-CM | POA: Insufficient documentation

## 2017-07-14 DIAGNOSIS — K219 Gastro-esophageal reflux disease without esophagitis: Secondary | ICD-10-CM | POA: Insufficient documentation

## 2017-07-14 MED ORDER — FAMOTIDINE 20 MG PO TABS: 20.0000 mg | ORAL_TABLET | Freq: Two times a day (BID) | ORAL | 0 refills | Status: DC

## 2017-07-14 NOTE — ED Triage Notes (Signed)
Pt reports having a cold a few weeks ago and then getting better and now has a feeling in her throat when she swallows that "something is touching the back of her throat".

## 2017-07-14 NOTE — Discharge Instructions (Signed)
Take medications as directed.  As we discussed, this may be a complication from GERD.  The medications that I prescribed will help with the GERD symptoms.  If you do not have any improvement in symptoms, follow-up with the referred GI doctor.  You may need further evaluation with an endoscopy.  To return to the emergency department for any fevers, difficulty swallowing your saliva, vomiting, difficulty swallowing foods, swelling of her neck or any other worsening or concerning symptoms.

## 2017-07-14 NOTE — ED Provider Notes (Signed)
[facility] EMERGENCY DEPARTMENT Provider Note   CSN: [id number] Arrival date & time: 07/14/17  1220     History   Chief Complaint Chief Complaint  Patient presents with  . Sore Throat    HPI Taylor Bowen is a 31 y.o. female who presents for evaluation of throat irritation for the last month.  Patient reports that over the last 4 weeks, she has had to clear her throat more often and feels like there is something in there.  Patient also reports that when she is swallowing, she will have to clear her throat because it feels abnormal.  She states that she is able to tolerate her secretions and tolerate p.o. without any difficulty.  She denies any immediate regurgitation or any vomiting.  Patient does report that she has heartburn and indigestion.  She states that often times she will have some indigestion that radiates up into her throat.  She has not on any medications for GERD.  Patient denies any fevers, difficulty speaking, neck swelling, facial swelling, difficulty tolerating p.o., difficulty tolerating secretions.  The history is provided by the patient.    Past Medical History:  Diagnosis Date  . Constipation   . Genital warts    acid treatment in 2007  . Pregnancy induced hypertension   . Urinary tract infection     Patient Active Problem List   Diagnosis Date Noted  . Fibroid uterus 05/26/2017  . Anemia due to chronic blood loss 05/25/2017  . GERD (gastroesophageal reflux disease) 01/30/2013  . Supervision of other normal pregnancy 11/07/2012    Past Surgical History:  Procedure Laterality Date  . HERNIA REPAIR     umbilical  . WISDOM TOOTH EXTRACTION      OB History    Gravida Para Term Preterm AB Living   3 2 2  0 1 2   SAB TAB Ectopic Multiple Live Births   0 1 0 0 2       Home Medications    Prior to Admission medications   Medication Sig Start Date End Date Taking? Authorizing Provider  Aspirin-Salicylamide-Caffeine (BC  HEADACHE POWDER PO) Take 1 packet by mouth daily as needed (headace).     [provider]  famotidine (PEPCID) 20 MG tablet Take 1 tablet (20 mg total) by mouth 2 (two) times daily. 07/14/17   Volanda Napoleon, PA-C  ferrous sulfate 325 (65 FE) MG tablet Take 1 tablet (325 mg total) by mouth 2 (two) times daily with a meal. 05/26/17   Woodroe Mode, MD  ibuprofen (ADVIL,MOTRIN) 600 MG tablet Take 1 tablet (600 mg total) by mouth every 6 (six) hours as needed for fever or headache. 05/26/17   Woodroe Mode, MD  medroxyPROGESTERone (PROVERA) 10 MG tablet Take 2 tablets (20 mg total) by mouth daily. 05/26/17   Woodroe Mode, MD  misoprostol (CYTOTEC) 200 MCG tablet Take 3 pills by mouth the night before biopsy. 06/08/17   [name], MD    Family History Family History  Problem Relation Age of Onset  . Anesthesia problems Neg Hx   . Other Neg Hx   . Ataxia Neg Hx   . Chorea Neg Hx   . Dementia Neg Hx   . Mental retardation Neg Hx   . Migraines Neg Hx   . Multiple sclerosis Neg Hx   . Neurofibromatosis Neg Hx   . Neuropathy Neg Hx   . Parkinsonism Neg Hx   . Seizures Neg Hx   .  Stroke Neg Hx     Social History Social History   Tobacco Use  . Smoking status: Former Smoker    Last attempt to quit: 02/07/2011    Years since quitting: 6.4  . Smokeless tobacco: Never Used  . Tobacco comment: in 2011  Substance Use Topics  . Alcohol use: Yes    Comment: occasional  . Drug use: No     Allergies   Patient has no known allergies.   Review of Systems Review of Systems  Constitutional: Negative for fever.  HENT: Positive for sore throat. Negative for drooling, trouble swallowing and voice change.      Physical Exam Updated Vital Signs BP 125/74 (BP Location: Right Arm)   Pulse 72   Temp 99 F (37.2 C) (Oral)   Resp 18   Ht 5\' 6"  (1.676 m)   Wt 66.2 kg (146 lb)   SpO2 100%   BMI 23.57 kg/m   Physical Exam  Constitutional: She appears well-developed and  well-nourished.  HENT:  Head: Normocephalic and atraumatic.  Mouth/Throat: Uvula is midline, oropharynx is clear and moist and mucous membranes are normal. No trismus in the jaw. No oropharyngeal exudate, posterior oropharyngeal edema or posterior oropharyngeal erythema.  Clear and moist posterior oropharynx with no signs of erythema, edema or exudates.  Uvula is midline.  No trismus.  No evidence of peritonsillar abscess.  No facial or neck swelling.  Airway is patent.  Phonation is intact.  Eyes: Conjunctivae and EOM are normal. Right eye exhibits no discharge. Left eye exhibits no discharge. No scleral icterus.  Neck: Trachea normal and phonation normal. No tracheal deviation present.  No abnormalities with swallowing  Pulmonary/Chest: Effort normal.  Neurological: She is alert.  Skin: Skin is warm and dry.  Psychiatric: She has a normal mood and affect. Her speech is normal and behavior is normal.  Nursing note and vitals reviewed.    ED Treatments / Results  Labs (all labs ordered are listed, but only abnormal results are displayed) Labs Reviewed - No data to display  EKG  EKG Interpretation None       Radiology No results found.  Procedures Procedures (including critical care time)  Medications Ordered in ED Medications - No data to display   Initial Impression / Assessment and Plan / ED Course  I have reviewed the triage vital signs and the nursing notes.  Pertinent labs & imaging results that were available during my care of the patient were reviewed by me and considered in my medical decision making (see chart for details).     31 year old female who presents for evaluation of throat irritation that has been going on for 1 month.  States that she has to clear her throat more often and notices an abnormal feeling when swallowing.  She has not had any difficulty tolerating secretions or her p.o.  She is not having any regurgitation or vomiting.  She reports a  history of heartburn and indigestion but is not currently on any medications. Patient is afebrile, non-toxic appearing, sitting comfortably on examination table. Vital signs reviewed and stable.  No evidence of respiratory distress.  Patient is tolerating secretions without any difficulty.  Physical exam shows no acute infectious signs.  History/physical exam are not concerning for Ludwig angina or peritonsillar abscess.  Concerned that this may be secondary to GERD.  Also concern for esophageal stricture versus web versus ring.  Vomiting.  No difficulties or abnormalities with swallowing.  History/physical exam are not concerning  for food impaction.  Plan to start patient on Pepcid for symptomatic relief of GERD.  Plan for outpatient GI referral for further evaluation of potential endoscopy evaluation. Patient had ample opportunity for questions and discussion. All patient's questions were answered with full understanding. Strict return precautions discussed. Patient expresses understanding and agreement to plan.    Final Clinical Impressions(s) / ED Diagnoses   Final diagnoses:  Throat irritation  Gastroesophageal reflux disease, esophagitis presence not specified    ED Discharge Orders        Ordered    famotidine (PEPCID) 20 MG tablet  2 times daily     07/14/17 1305       Desma Mcgregor 07/14/17 1336    Blanchie Dessert, MD 07/15/17 1409

## 2017-07-28 NOTE — Patient Instructions (Addendum)
Your procedure is scheduled on:  Thursday, Jan 17  Enter through the Main Entrance of [facility] at:  1:45 pm  Pick up the phone at the desk and dial [phone].  Call this number if you have problems the morning of surgery: [phone].  Remember: You may have dry toast on Thursday morning and must be finished by 7 am.  You may drink clear liquids (including water) up to 9 am Thursday, day of surgery.  Take these medicines the morning of surgery with a SIP OF WATER:  pepcid  Do NOT wear jewelry (body piercing), metal hair clips/bobby pins, false eye lashes, make-up, or nail polish. Do NOT wear lotions, powders, or perfumes.  You may wear deoderant. Do NOT shave for 48 hours prior to surgery. Do NOT bring valuables to the hospital. Contacts may not be worn into surgery.  Leave suitcase in car.  After surgery it may be brought to your room.  For patients admitted to the hospital, checkout time is 11:00 AM the day of discharge. Have a responsible adult drive you home and stay with you for 24 hours after your procedure.  Home with - to be arranged prior to surgery.  Patient Informed can not go home by bus. taxi or Lane.

## 2017-08-01 ENCOUNTER — Other Ambulatory Visit: Payer: Self-pay

## 2017-08-01 ENCOUNTER — Encounter (HOSPITAL_COMMUNITY): Payer: Self-pay

## 2017-08-01 ENCOUNTER — Encounter (HOSPITAL_COMMUNITY)
Admission: RE | Admit: 2017-08-01 | Discharge: 2017-08-01 | Disposition: A | Discharge status: Home / Self Care | Payer: Medicaid Other | Source: Ambulatory Visit | Attending: Obstetrics & Gynecology | Admitting: Obstetrics & Gynecology

## 2017-08-01 DIAGNOSIS — Z01818 Encounter for other preprocedural examination: Secondary | ICD-10-CM | POA: Diagnosis present

## 2017-08-01 HISTORY — DX: Anemia, unspecified: D64.9

## 2017-08-01 HISTORY — DX: Personal history of other medical treatment: Z92.89

## 2017-08-01 HISTORY — DX: Gastro-esophageal reflux disease without esophagitis: K21.9

## 2017-08-01 LAB — CBC
HCT: 37.7 % (ref 36.0–46.0)
Hemoglobin: 11.9 g/dL — ABNORMAL LOW (ref 12.0–15.0)
MCH: 26 pg (ref 26.0–34.0)
MCHC: 31.6 g/dL (ref 30.0–36.0)
MCV: 82.3 fL (ref 78.0–100.0)
Platelets: 204 10*3/uL (ref 150–400)
RBC: 4.58 MIL/uL (ref 3.87–5.11)
RDW: 14.4 % (ref 11.5–15.5)
WBC: 4.5 10*3/uL (ref 4.0–10.5)

## 2017-08-01 LAB — TYPE AND SCREEN
ABO/RH(D): A POS
Antibody Screen: NEGATIVE

## 2017-08-01 NOTE — Pre-Procedure Instructions (Signed)
SDS BB History Log given to lab for patient's previous history of a blood transfusion on 05/25/17 at [facility].

## 2017-08-11 ENCOUNTER — Other Ambulatory Visit: Payer: Self-pay

## 2017-08-11 ENCOUNTER — Encounter (HOSPITAL_COMMUNITY)
Admission: RE | Disposition: A | Discharge status: Home / Self Care | Payer: Self-pay | Source: Ambulatory Visit | Attending: Obstetrics & Gynecology

## 2017-08-11 ENCOUNTER — Inpatient Hospital Stay (HOSPITAL_COMMUNITY)
Admission: RE | Admit: 2017-08-11 | Discharge: 2017-08-13 | DRG: 743 | Disposition: A | Discharge status: Home / Self Care | Payer: Medicaid Other | Source: Ambulatory Visit | Attending: Obstetrics & Gynecology | Admitting: Obstetrics & Gynecology

## 2017-08-11 ENCOUNTER — Inpatient Hospital Stay (HOSPITAL_COMMUNITY): Payer: Medicaid Other | Admitting: Certified Registered Nurse Anesthetist

## 2017-08-11 ENCOUNTER — Encounter: Payer: Self-pay | Admitting: Obstetrics and Gynecology

## 2017-08-11 DIAGNOSIS — D259 Leiomyoma of uterus, unspecified: Principal | ICD-10-CM | POA: Diagnosis present

## 2017-08-11 DIAGNOSIS — Z87891 Personal history of nicotine dependence: Secondary | ICD-10-CM | POA: Diagnosis not present

## 2017-08-11 DIAGNOSIS — D649 Anemia, unspecified: Secondary | ICD-10-CM | POA: Diagnosis present

## 2017-08-11 DIAGNOSIS — Z9889 Other specified postprocedural states: Secondary | ICD-10-CM

## 2017-08-11 DIAGNOSIS — N920 Excessive and frequent menstruation with regular cycle: Secondary | ICD-10-CM | POA: Diagnosis present

## 2017-08-11 HISTORY — PX: MYOMECTOMY: SHX85

## 2017-08-11 LAB — PREGNANCY, URINE: Preg Test, Ur: NEGATIVE

## 2017-08-11 SURGERY — MYOMECTOMY, ABDOMINAL APPROACH
Anesthesia: Epidural | Site: Abdomen

## 2017-08-11 MED ORDER — OXYCODONE HCL 5 MG PO TABS: 5.0000 mg | ORAL_TABLET | Freq: Once | ORAL | Status: DC | PRN

## 2017-08-11 MED ORDER — VASOPRESSIN 20 UNIT/ML IV SOLN
INTRAVENOUS | Status: DC | PRN
  Administered 2017-08-11: 20 mL via INTRAMUSCULAR

## 2017-08-11 MED ORDER — SUGAMMADEX SODIUM 200 MG/2ML IV SOLN
INTRAVENOUS | Status: DC | PRN
  Administered 2017-08-11: 200 mg via INTRAVENOUS

## 2017-08-11 MED ORDER — DEXAMETHASONE SODIUM PHOSPHATE 4 MG/ML IJ SOLN
INTRAMUSCULAR | Status: AC
  Filled 2017-08-11: qty 1

## 2017-08-11 MED ORDER — CEFAZOLIN SODIUM-DEXTROSE 2-4 GM/100ML-% IV SOLN
2.0000 g | INTRAVENOUS | Status: AC
  Administered 2017-08-11: 2 g via INTRAVENOUS
  Filled 2017-08-11: qty 100

## 2017-08-11 MED ORDER — ACETAMINOPHEN 325 MG PO TABS: 325.0000 mg | ORAL_TABLET | ORAL | Status: DC | PRN

## 2017-08-11 MED ORDER — ACETAMINOPHEN 160 MG/5ML PO SOLN: 325.0000 mg | ORAL | Status: DC | PRN

## 2017-08-11 MED ORDER — ROCURONIUM BROMIDE 100 MG/10ML IV SOLN
INTRAVENOUS | Status: AC
Start: 2017-08-11 — End: 2017-08-11
  Filled 2017-08-11: qty 1

## 2017-08-11 MED ORDER — CEFAZOLIN SODIUM-DEXTROSE 2-3 GM-%(50ML) IV SOLR
INTRAVENOUS | Status: AC
  Filled 2017-08-11: qty 50

## 2017-08-11 MED ORDER — IBUPROFEN 800 MG PO TABS
800.0000 mg | ORAL_TABLET | Freq: Three times a day (TID) | ORAL | Status: DC | PRN
  Administered 2017-08-12: 800 mg via ORAL
  Filled 2017-08-11: qty 1

## 2017-08-11 MED ORDER — BUPIVACAINE HCL (PF) 0.5 % IJ SOLN
INTRAMUSCULAR | Status: DC | PRN
  Administered 2017-08-11: 30 mL

## 2017-08-11 MED ORDER — FENTANYL CITRATE (PF) 100 MCG/2ML IJ SOLN: 25.0000 ug | INTRAMUSCULAR | Status: DC | PRN

## 2017-08-11 MED ORDER — LIDOCAINE HCL (CARDIAC) 20 MG/ML IV SOLN
INTRAVENOUS | Status: DC | PRN
  Administered 2017-08-11: 100 mg via INTRAVENOUS

## 2017-08-11 MED ORDER — ROCURONIUM BROMIDE 100 MG/10ML IV SOLN
INTRAVENOUS | Status: DC | PRN
  Administered 2017-08-11: 5 mg via INTRAVENOUS
  Administered 2017-08-11: 40 mg via INTRAVENOUS

## 2017-08-11 MED ORDER — SUCCINYLCHOLINE CHLORIDE 200 MG/10ML IV SOSY
PREFILLED_SYRINGE | INTRAVENOUS | Status: AC
  Filled 2017-08-11: qty 10

## 2017-08-11 MED ORDER — OXYCODONE HCL 5 MG/5ML PO SOLN: 5.0000 mg | Freq: Once | ORAL | Status: DC | PRN

## 2017-08-11 MED ORDER — VASOPRESSIN 20 UNIT/ML IV SOLN
INTRAVENOUS | Status: AC
  Filled 2017-08-11: qty 1

## 2017-08-11 MED ORDER — ONDANSETRON HCL 4 MG/2ML IJ SOLN: 4.0000 mg | Freq: Once | INTRAMUSCULAR | Status: DC | PRN

## 2017-08-11 MED ORDER — BUPIVACAINE HCL (PF) 0.5 % IJ SOLN
INTRAMUSCULAR | Status: AC
  Filled 2017-08-11: qty 30

## 2017-08-11 MED ORDER — ONDANSETRON HCL 4 MG/2ML IJ SOLN
INTRAMUSCULAR | Status: AC
  Filled 2017-08-11: qty 2

## 2017-08-11 MED ORDER — LACTATED RINGERS IV SOLN
INTRAVENOUS | Status: DC
  Administered 2017-08-11 (×2): via INTRAVENOUS

## 2017-08-11 MED ORDER — LIDOCAINE HCL (CARDIAC) 20 MG/ML IV SOLN
INTRAVENOUS | Status: AC
  Filled 2017-08-11: qty 5

## 2017-08-11 MED ORDER — LACTATED RINGERS IV SOLN
INTRAVENOUS | Status: DC
  Administered 2017-08-11: 22:00:00 via INTRAVENOUS

## 2017-08-11 MED ORDER — KETOROLAC TROMETHAMINE 30 MG/ML IJ SOLN
INTRAMUSCULAR | Status: DC | PRN
  Administered 2017-08-11: 30 mg via INTRAVENOUS

## 2017-08-11 MED ORDER — ONDANSETRON HCL 4 MG/2ML IJ SOLN
4.0000 mg | Freq: Four times a day (QID) | INTRAMUSCULAR | Status: DC | PRN
  Administered 2017-08-11: 4 mg via INTRAVENOUS
  Filled 2017-08-11: qty 2

## 2017-08-11 MED ORDER — ONDANSETRON HCL 4 MG/2ML IJ SOLN
INTRAMUSCULAR | Status: DC | PRN
  Administered 2017-08-11: 4 mg via INTRAVENOUS

## 2017-08-11 MED ORDER — PROPOFOL 10 MG/ML IV BOLUS
INTRAVENOUS | Status: DC | PRN
  Administered 2017-08-11: 200 mg via INTRAVENOUS

## 2017-08-11 MED ORDER — ONDANSETRON HCL 4 MG PO TABS: 4.0000 mg | ORAL_TABLET | Freq: Four times a day (QID) | ORAL | Status: DC | PRN

## 2017-08-11 MED ORDER — SCOPOLAMINE 1 MG/3DAYS TD PT72
1.0000 | MEDICATED_PATCH | Freq: Once | TRANSDERMAL | Status: DC
  Administered 2017-08-11: 1.5 mg via TRANSDERMAL

## 2017-08-11 MED ORDER — DEXAMETHASONE SODIUM PHOSPHATE 10 MG/ML IJ SOLN
INTRAMUSCULAR | Status: DC | PRN
  Administered 2017-08-11: 4 mg via INTRAVENOUS

## 2017-08-11 MED ORDER — KETOROLAC TROMETHAMINE 30 MG/ML IJ SOLN: 30.0000 mg | Freq: Once | INTRAMUSCULAR | Status: DC | PRN

## 2017-08-11 MED ORDER — MEPERIDINE HCL 25 MG/ML IJ SOLN: 6.2500 mg | INTRAMUSCULAR | Status: DC | PRN

## 2017-08-11 MED ORDER — SUGAMMADEX SODIUM 200 MG/2ML IV SOLN
INTRAVENOUS | Status: AC
  Filled 2017-08-11: qty 2

## 2017-08-11 MED ORDER — SCOPOLAMINE 1 MG/3DAYS TD PT72
MEDICATED_PATCH | TRANSDERMAL | Status: AC
  Administered 2017-08-11: 1.5 mg via TRANSDERMAL
  Filled 2017-08-11: qty 1

## 2017-08-11 MED ORDER — MIDAZOLAM HCL 2 MG/2ML IJ SOLN
INTRAMUSCULAR | Status: DC | PRN
  Administered 2017-08-11: 2 mg via INTRAVENOUS

## 2017-08-11 MED ORDER — ROCURONIUM BROMIDE 100 MG/10ML IV SOLN: INTRAVENOUS | Status: DC | PRN

## 2017-08-11 MED ORDER — FENTANYL CITRATE (PF) 100 MCG/2ML IJ SOLN
INTRAMUSCULAR | Status: DC | PRN
  Administered 2017-08-11 (×5): 50 ug via INTRAVENOUS

## 2017-08-11 MED ORDER — SODIUM CHLORIDE 0.9 % IJ SOLN
INTRAMUSCULAR | Status: AC
  Filled 2017-08-11: qty 100

## 2017-08-11 MED ORDER — MIDAZOLAM HCL 2 MG/2ML IJ SOLN
INTRAMUSCULAR | Status: AC
  Filled 2017-08-11: qty 2

## 2017-08-11 MED ORDER — PROPOFOL 10 MG/ML IV BOLUS
INTRAVENOUS | Status: AC
  Filled 2017-08-11: qty 20

## 2017-08-11 MED ORDER — FENTANYL CITRATE (PF) 250 MCG/5ML IJ SOLN
INTRAMUSCULAR | Status: AC
  Filled 2017-08-11: qty 5

## 2017-08-11 MED ORDER — HYDROMORPHONE HCL 1 MG/ML IJ SOLN: 0.2000 mg | INTRAMUSCULAR | Status: DC | PRN

## 2017-08-11 SURGICAL SUPPLY — 34 items
APL SKNCLS STERI-STRIP NONHPOA (GAUZE/BANDAGES/DRESSINGS) ×1
BENZOIN TINCTURE PRP APPL 2/3 (GAUZE/BANDAGES/DRESSINGS) ×1 IMPLANT
CANISTER SUCT 3000ML PPV (MISCELLANEOUS) ×2 IMPLANT
CLOTH BEACON ORANGE TIMEOUT ST (SAFETY) ×2 IMPLANT
CONT PATH 16OZ SNAP LID 3702 (MISCELLANEOUS) ×2 IMPLANT
DECANTER SPIKE VIAL GLASS SM (MISCELLANEOUS) IMPLANT
DRAPE CESAREAN BIRTH W POUCH (DRAPES) ×2 IMPLANT
DRSG OPSITE POSTOP 4X10 (GAUZE/BANDAGES/DRESSINGS) ×1 IMPLANT
DURAPREP 26ML APPLICATOR (WOUND CARE) ×4 IMPLANT
GAUZE SPONGE 4X4 16PLY XRAY LF (GAUZE/BANDAGES/DRESSINGS) ×2 IMPLANT
GLOVE BIO SURGEON STRL SZ 6.5 (GLOVE) ×2 IMPLANT
GLOVE BIOGEL PI IND STRL 7.0 (GLOVE) ×2 IMPLANT
GLOVE BIOGEL PI INDICATOR 7.0 (GLOVE) ×2
GOWN STRL REUS W/TWL LRG LVL3 (GOWN DISPOSABLE) ×6 IMPLANT
NDL SPNL 18GX3.5 QUINCKE PK (NEEDLE) ×1 IMPLANT
NEEDLE SPNL 18GX3.5 QUINCKE PK (NEEDLE) ×2 IMPLANT
NS IRRIG 1000ML POUR BTL (IV SOLUTION) ×2 IMPLANT
PACK ABDOMINAL GYN (CUSTOM PROCEDURE TRAY) ×2 IMPLANT
PAD OB MATERNITY 4.3X12.25 (PERSONAL CARE ITEMS) ×2 IMPLANT
PENCIL SMOKE EVAC W/HOLSTER (ELECTROSURGICAL) ×2 IMPLANT
PROTECTOR NERVE ULNAR (MISCELLANEOUS) ×2 IMPLANT
RTRCTR C-SECT PINK 25CM LRG (MISCELLANEOUS) IMPLANT
SPONGE LAP 18X18 X RAY DECT (DISPOSABLE) IMPLANT
STRIP CLOSURE SKIN 1/2X4 (GAUZE/BANDAGES/DRESSINGS) ×3 IMPLANT
SUT VIC AB 0 CT1 27 (SUTURE) ×18
SUT VIC AB 0 CT1 27XBRD ANBCTR (SUTURE) ×2 IMPLANT
SUT VIC AB 3-0 SH 27 (SUTURE)
SUT VIC AB 3-0 SH 27X BRD (SUTURE) IMPLANT
SUT VIC AB 4-0 SH 27 (SUTURE) ×2
SUT VIC AB 4-0 SH 27XANBCTRL (SUTURE) ×1 IMPLANT
SUT VICRYL 0 TIES 12 18 (SUTURE) IMPLANT
SYR 30ML LL (SYRINGE) ×2 IMPLANT
TOWEL OR 17X24 6PK STRL BLUE (TOWEL DISPOSABLE) ×4 IMPLANT
TRAY FOLEY CATH SILVER 14FR (SET/KITS/TRAYS/PACK) ×2 IMPLANT

## 2017-08-11 NOTE — Op Note (Signed)
08/11/2017  6:00 PM  PATIENT:  Taylor Bowen  32 y.o. female  PRE-OPERATIVE DIAGNOSIS:  Menorrhagia, 11 cm fibroid Fibroid  POST-OPERATIVE DIAGNOSIS:  anemia Fibroid  PROCEDURE:  Procedure(s): MYOMECTOMY (N/A)  SURGEON:  Surgeon(s) and Role:    * Chanon Loney, Wilhemina Cash, MD - Primary    * Aletha Halim, MD - Assisting   ANESTHESIA:   general  EBL:  300 mL   BLOOD ADMINISTERED:none  DRAINS: none   LOCAL MEDICATIONS USED:  MARCAINE     SPECIMEN:  Source of Specimen:  fibroid  DISPOSITION OF SPECIMEN:  PATHOLOGY  COUNTS:  YES  TOURNIQUET:  * No tourniquets in log *  DICTATION: .Dragon Dictation  PLAN OF CARE: Admit to inpatient   PATIENT DISPOSITION:  PACU - hemodynamically stable.   Delay start of Pharmacological VTE agent (>24hrs) due to surgical blood loss or risk of bleeding: not applicable     The risks, benefits, and alternatives of surgery were explained, understood, and accepted. Consents were signed. All questions were answered. She was taken to the operating room and general  anesthesia was applied without complication. Her abdomen and vagina were prepped and draped in the usual sterile fashion. A Foley catheter was placed which drained clear urine throughout the case. A transverse incision was made approximately 2 cm above her symphysis pubis after injecting 30 mL of 0.5% marcaine in the subcutaneous tissue. The incision was carried down through the subcutaneous tissue to the fascia. Bleeding encountered was cauterized with the Bovie. The fascia was scored the midline and the fascial incision was extended bilaterally. The pyramidalis muscles were separated in a transverse fashion using electrosurgical technique. Approximately 2 cm of the rectus muscles were separated in a transverse fashion in the midline using electrosurgical technique. Hemostasis was maintained. The peritoneum was entered with hemostats and the peritoneal incision was extended bilaterally with the  Bovie, taking care to avoid bowel and bladder. The patient was placed in Trendelenburg position and her bowel was packed out of the abdominal cavity. The pelvis was inspected. Her very large uterus filled the entire pelvis. I used a towel clamp to elevate the uterus out of the incision. I then injected dilute pitressin solution along the back of the uterus from the fundus to the lower portion of the uterus, above the uterosacral ligaments.  I then grasped the large fibroid with a towel clamp and excised it with traction and mayo scissors. Once the fibroid was removed, I noted that the endometrium was entered. I used 3-0 vicryl to close the endometrium. I then closed the myometrium in 3 layers of 0 vicryl suture. Excellent hemostasis was noted. I closed  The serosa with a baseball suture with 3-0 vicryl. Excellent hemostasis was noted. The uterus was placed back in the pelvis.  The rectus muscles were inspected and hemostasis was assured. The fascia was closed with a 0 Vicryl running nonlocking suture. The subcutaneous tissue was irrigated, clean, dry.  A subcuticular closure was done with 3-0 vicryl suture. She tolerated the procedure well and was taken to the recovery room in stable condition. Her Foley catheter drained clear urine throughout.

## 2017-08-11 NOTE — Transfer of Care (Signed)
Immediate Anesthesia Transfer of Care Note  Patient: Taylor Bowen  Procedure(s) Performed: MYOMECTOMY (N/A Abdomen)  Patient Location: PACU  Anesthesia Type:General  Level of Consciousness: awake, alert , oriented and patient cooperative  Airway & Oxygen Therapy: Patient Spontanous Breathing and Patient connected to nasal cannula oxygen  Post-op Assessment: Report given to RN, Post -op Vital signs reviewed and stable and Patient moving all extremities X 4  Post vital signs: Reviewed and stable  Last Vitals:  Vitals:   08/11/17 1344  BP: 123/80  Pulse: 69  Resp: 16  Temp: 37.1 C  SpO2: 100%    Last Pain:  Vitals:   08/11/17 1344  TempSrc: Oral  PainSc: 2       Patients Stated Pain Goal: 4 (23/95/32 0233)  Complications: No apparent anesthesia complications

## 2017-08-11 NOTE — H&P (Signed)
Taylor Bowen is an 32 y.o. female. Single G0 here for a myomectomy. Her u/s showed an 11 cm fibroid. Her hemoglobin has been as low as 5 and she has been transfused. She has her second nexplanon in place. She does not want a hysterectomy.   Patient's last menstrual period was 08/11/2017.    Past Medical History:  Diagnosis Date  . Anemia   . Constipation    Resolved per patient 08/01/17  . Genital warts    acid treatment in 2007  . GERD (gastroesophageal reflux disease)    occasional  . History of blood transfusion 05/25/2017   [place]  . Urinary tract infection     Past Surgical History:  Procedure Laterality Date  . HERNIA REPAIR     umbilical  . WISDOM TOOTH EXTRACTION      Family History  Problem Relation Age of Onset  . Anesthesia problems Neg Hx   . Other Neg Hx   . Ataxia Neg Hx   . Chorea Neg Hx   . Dementia Neg Hx   . Mental retardation Neg Hx   . Migraines Neg Hx   . Multiple sclerosis Neg Hx   . Neurofibromatosis Neg Hx   . Neuropathy Neg Hx   . Parkinsonism Neg Hx   . Seizures Neg Hx   . Stroke Neg Hx     Social History:  reports that she quit smoking about 6 years ago. Her smoking use included cigarettes. She has a 3.00 pack-year smoking history. she has never used smokeless tobacco. She reports that she drinks alcohol. She reports that she does not use drugs.  Allergies: No Known Allergies  Medications Prior to Admission  Medication Sig Dispense Refill Last Dose  . ferrous sulfate 325 (65 FE) MG tablet Take 1 tablet (325 mg total) by mouth 2 (two) times daily with a meal. 60 tablet 3 08/10/2017 at Unknown time  . ibuprofen (ADVIL,MOTRIN) 600 MG tablet Take 1 tablet (600 mg total) by mouth every 6 (six) hours as needed for fever or headache. 30 tablet 0 Past Week at Unknown time  . etonogestrel (NEXPLANON) 68 MG IMPL implant 1 each by Subdermal route once.     . famotidine (PEPCID) 20 MG tablet Take 1 tablet (20 mg total) by mouth 2 (two) times daily.  (Patient not taking: Reported on 07/28/2017) 30 tablet 0 Not Taking at Unknown time  . medroxyPROGESTERone (PROVERA) 10 MG tablet Take 2 tablets (20 mg total) by mouth daily. (Patient not taking: Reported on 07/28/2017) 30 tablet 2 Not Taking at Unknown time    ROS  Blood pressure 123/80, pulse 69, temperature 98.7 F (37.1 C), temperature source Oral, resp. rate 16, height 5\' 5"  (1.651 m), weight 68 kg (150 lb), last menstrual period 08/11/2017, SpO2 100 %. Physical Exam  Breathing, conversing, and ambulating normally Well nourished, well hydrated Black female, no apparent distress Uterus palpable to her umbilicus She has extensive tatoos on her abdomen and requests that I make a transverse incision.    Results for orders placed or performed during the hospital encounter of 08/11/17 (from the past 24 hour(s))  Pregnancy, urine     Status: None   Collection Time: 08/11/17 12:30 PM  Result Value Ref Range   Preg Test, Ur NEGATIVE NEGATIVE  Type and screen [place]     Status: None   Collection Time: 08/11/17  2:18 PM  Result Value Ref Range   ABO/RH(D) A POS  Antibody Screen NEG    Sample Expiration 08/14/2017     No results found.  Assessment/Plan: Symptomatic fibroid with anemia, stable at present Plan for myomectomy  She understands the risks of surgery, including, but not to infection, bleeding, DVTs, damage to bowel, bladder, ureters. She wishes to proceed.    [name] 08/11/2017, 4:42 PM

## 2017-08-11 NOTE — Anesthesia Procedure Notes (Signed)
Procedure Name: Intubation Performed by: Sandrea Matte, CRNA Pre-anesthesia Checklist: Patient identified, Emergency Drugs available, Suction available and Patient being monitored Patient Re-evaluated:Patient Re-evaluated prior to induction Oxygen Delivery Method: Circle system utilized Preoxygenation: Pre-oxygenation with 100% oxygen Induction Type: IV induction Ventilation: Mask ventilation without difficulty Laryngoscope Size: Mac and 3 Grade View: Grade I Tube type: Oral Tube size: 7.0 mm Number of attempts: 1 Airway Equipment and Method: Stylet Placement Confirmation: ETT inserted through vocal cords under direct vision,  positive ETCO2 and breath sounds checked- equal and bilateral Secured at: 22 (@ lips) cm Tube secured with: Tape Dental Injury: Teeth and Oropharynx as per pre-operative assessment

## 2017-08-11 NOTE — Anesthesia Preprocedure Evaluation (Signed)
Anesthesia Evaluation  Patient identified by MRN, date of birth, ID band Patient awake    Reviewed: Allergy & Precautions, H&P , NPO status , Patient's Chart, lab work & pertinent test results, reviewed documented beta blocker date and time   History of Anesthesia Complications Negative for: history of anesthetic complications  Airway Mallampati: III  TM Distance: >3 FB Neck ROM: full    Dental  (+) Teeth Intact   Pulmonary former smoker,    breath sounds clear to auscultation       Cardiovascular (-) hypertension (h/o PIH in prior pregnancy)negative cardio ROS   Rhythm:regular Rate:Normal     Neuro/Psych negative neurological ROS  negative psych ROS   GI/Hepatic Neg liver ROS, GERD  Medicated,  Endo/Other  negative endocrine ROS  Renal/GU negative Renal ROS     Musculoskeletal   Abdominal   Peds  Hematology negative hematology ROS (+)   Anesthesia Other Findings   Reproductive/Obstetrics (+) Pregnancy                             Anesthesia Physical  Anesthesia Plan  ASA: II  Anesthesia Plan: Epidural and General   Post-op Pain Management:    Induction: Intravenous  PONV Risk Score and Plan:   Airway Management Planned: Oral ETT  Additional Equipment:   Intra-op Plan:   Post-operative Plan: Extubation in OR  Informed Consent: I have reviewed the patients History and Physical, chart, labs and discussed the procedure including the risks, benefits and alternatives for the proposed anesthesia with the patient or authorized representative who has indicated his/her understanding and acceptance.     Plan Discussed with: Anesthesiologist and CRNA  Anesthesia Plan Comments: (  )        Anesthesia Quick Evaluation

## 2017-08-12 ENCOUNTER — Encounter (HOSPITAL_COMMUNITY): Payer: Self-pay | Admitting: Obstetrics & Gynecology

## 2017-08-12 LAB — CBC
HCT: 22.7 % — ABNORMAL LOW (ref 36.0–46.0)
Hemoglobin: 7.6 g/dL — ABNORMAL LOW (ref 12.0–15.0)
MCH: 27 pg (ref 26.0–34.0)
MCHC: 33.5 g/dL (ref 30.0–36.0)
MCV: 80.5 fL (ref 78.0–100.0)
Platelets: 147 10*3/uL — ABNORMAL LOW (ref 150–400)
RBC: 2.82 MIL/uL — ABNORMAL LOW (ref 3.87–5.11)
RDW: 13.4 % (ref 11.5–15.5)
WBC: 10.9 10*3/uL — ABNORMAL HIGH (ref 4.0–10.5)

## 2017-08-12 MED ORDER — OXYCODONE-ACETAMINOPHEN 5-325 MG PO TABS: 1.0000 | ORAL_TABLET | ORAL | Status: DC | PRN

## 2017-08-12 MED ORDER — IBUPROFEN 600 MG PO TABS
600.0000 mg | ORAL_TABLET | Freq: Four times a day (QID) | ORAL | Status: DC | PRN
  Administered 2017-08-12 – 2017-08-13 (×2): 600 mg via ORAL
  Filled 2017-08-12 (×2): qty 1

## 2017-08-12 MED ORDER — POLYETHYLENE GLYCOL 3350 17 G PO PACK
17.0000 g | PACK | Freq: Every day | ORAL | Status: DC
  Administered 2017-08-12 – 2017-08-13 (×2): 17 g via ORAL
  Filled 2017-08-12 (×2): qty 1

## 2017-08-12 NOTE — Anesthesia Postprocedure Evaluation (Signed)
Anesthesia Post Note  Patient: Taylor Bowen  Procedure(s) Performed: MYOMECTOMY (N/A Abdomen)     Patient location during evaluation: [facility] Anesthesia Type: General Level of consciousness: awake, awake and alert, oriented and patient cooperative Pain management: satisfactory to patient (RN aware of patient pain) Vital Signs Assessment: post-procedure vital signs reviewed and stable Respiratory status: spontaneous breathing, nonlabored ventilation and respiratory function stable Cardiovascular status: stable Postop Assessment: no apparent nausea or vomiting (patient has had N/V, but none at time of post op interview) Anesthetic complications: yes Anesthetic complication details: PONVComments: I informed patient that her RN can get her nausea meds if she needs, and to let anesthesia know that she has PONV if she has surgery in the future.     Last Vitals:  Vitals:   08/12/17 0422 08/12/17 0754  BP: (!) 101/49 (!) 99/58  Pulse: 76 81  Resp: 18 16  Temp: 37.3 C 37.1 C  SpO2: 99% 100%    Last Pain:  Vitals:   08/12/17 0754  TempSrc: Oral  PainSc:    Pain Goal: Patients Stated Pain Goal: 4 (08/12/17 0455)               Rhylen Pulido L

## 2017-08-12 NOTE — Progress Notes (Signed)
1 Day Post-Op Procedure(s) (LRB): MYOMECTOMY (N/A)  Subjective: Patient reports tolerating PO and no problems voiding. She is ambulating without difficulty.   Objective: I have reviewed patient's vital signs, intake and output, medications and labs.  General: alert Resp: clear to auscultation bilaterally Cardio: regular rate and rhythm, S1, S2 normal, no murmur, click, rub or gallop GI: soft, non-tender; bowel sounds normal; no masses,  no organomegaly Vaginal Bleeding: minimal Incision- pressure dressing removed, no active bleeding HBG- 7.6, pre op 11.9 Assessment: s/p Procedure(s): MYOMECTOMY (N/A): progressing well  Plan: Discharge home tomorrow after a recheck of her hbg  LOS: 1 day    [name] 08/12/2017, 12:37 PM

## 2017-08-13 LAB — CBC
HCT: 18 % — ABNORMAL LOW (ref 36.0–46.0)
HCT: 26 % — ABNORMAL LOW (ref 36.0–46.0)
Hemoglobin: 5.8 g/dL — CL (ref 12.0–15.0)
Hemoglobin: 8.7 g/dL — ABNORMAL LOW (ref 12.0–15.0)
MCH: 26.2 pg (ref 26.0–34.0)
MCH: 27.5 pg (ref 26.0–34.0)
MCHC: 32.2 g/dL (ref 30.0–36.0)
MCHC: 33.5 g/dL (ref 30.0–36.0)
MCV: 81.4 fL (ref 78.0–100.0)
MCV: 82.3 fL (ref 78.0–100.0)
Platelets: 117 10*3/uL — ABNORMAL LOW (ref 150–400)
Platelets: 127 10*3/uL — ABNORMAL LOW (ref 150–400)
RBC: 2.21 MIL/uL — ABNORMAL LOW (ref 3.87–5.11)
RBC: 3.16 MIL/uL — ABNORMAL LOW (ref 3.87–5.11)
RDW: 13.6 % (ref 11.5–15.5)
RDW: 13.5 % (ref 11.5–15.5)
WBC: 7.1 10*3/uL (ref 4.0–10.5)
WBC: 9.5 10*3/uL (ref 4.0–10.5)

## 2017-08-13 LAB — PREPARE RBC (CROSSMATCH)

## 2017-08-13 MED ORDER — ACETAMINOPHEN 325 MG PO TABS
650.0000 mg | ORAL_TABLET | Freq: Once | ORAL | Status: AC
Start: 2017-08-13 — End: 2017-08-13
  Administered 2017-08-13: 650 mg via ORAL
  Filled 2017-08-13: qty 2

## 2017-08-13 MED ORDER — SODIUM CHLORIDE 0.9 % IV SOLN
Freq: Once | INTRAVENOUS | Status: AC
  Administered 2017-08-13: 08:00:00 via INTRAVENOUS

## 2017-08-13 MED ORDER — OXYCODONE-ACETAMINOPHEN 5-325 MG PO TABS: 1.0000 | ORAL_TABLET | ORAL | 0 refills | Status: DC | PRN

## 2017-08-13 MED ORDER — DIPHENHYDRAMINE HCL 25 MG PO CAPS
25.0000 mg | ORAL_CAPSULE | Freq: Once | ORAL | Status: AC
  Administered 2017-08-13: 25 mg via ORAL
  Filled 2017-08-13: qty 1

## 2017-08-13 MED ORDER — POLYETHYLENE GLYCOL 3350 17 G PO PACK: 17.0000 g | PACK | Freq: Every day | ORAL | 0 refills | Status: DC

## 2017-08-13 NOTE — Discharge Instructions (Signed)
Myomectomy, Care After °Refer to this sheet in the next few weeks. These instructions provide you with information on caring for yourself after your procedure. Your health care provider may also give you more specific instructions. Your treatment has been planned according to current medical practices, but problems sometimes occur. Call your health care provider if you have any problems or questions after your procedure. °What can I expect after the procedure? °After your procedure, it is typical to have the following: °· Pain in your abdomen, especially at any incision sites. You will be given pain medicine to control the pain. °· Tiredness. This is a normal part of the recovery process. Your energy level will return to normal over the next several weeks. °· Constipation. °· Vaginal bleeding. This is normal and should stop after 1-2 weeks. ° °Follow these instructions at home: °· Only take over-the-counter or prescription medicines as directed by your health care provider. Avoid aspirin because it can cause bleeding. °· Do not douche, use tampons, or have sexual intercourse until given permission by your health care provider. °· Remove or change any bandages (dressings) as directed by your health care provider. °· Take showers instead of baths as directed by your health care provider. °· You will probably be able to go back to your normal routine after a few days. Do not do anything that requires extra effort until your health care provider says it is okay. Do not lift anything heavier than 15 pounds (6.8 kg) until your health care provider approves. °· Walk daily but take frequent rest breaks if you tire easily. °· Continue to practice deep breathing and coughing. If it hurts to cough, try holding a pillow against your belly as you cough. °· If you become constipated, you may: °? Use a mild laxative if your health care provider approves. °? Add more fruit and bran to your diet. °? Drink enough fluids to keep your  urine clear or pale yellow. °· Take your temperature twice a day and write it down. °· Do not drink alcohol. °· Do not drive until your health care provider approves. °· Have someone help you at home for 1 week or until you can do your own household activities. °· Follow up with your health care provider as directed. °Contact a health care provider if: °· You have a fever. °· You have increasing abdominal pain that is not relieved with medicine. °· You have nausea, vomiting, or diarrhea. °· You have pain when you urinate, or you have blood in your urine. °· You have a rash on your body. °· You have pain or redness where your IV access tube was inserted. °· You have redness, swelling, or any kind of drainage from an incision. °Get help right away if: °· You have weakness or lightheadedness. °· You have pain, swelling, or redness in your legs. °· You have chest pain. °· You faint. °· You have shortness of breath. °· You have heavy vaginal bleeding. °· Your incision is opening up. °This information is not intended to replace advice given to you by your health care provider. Make sure you discuss any questions you have with your health care provider. °Document Released: 12/02/2010 Document Revised: 12/18/2015 Document Reviewed: 02/21/2013 °Elsevier Interactive Patient Education © 2017 [employer] ° °

## 2017-08-13 NOTE — Progress Notes (Signed)
Notified provider of HGB of 5.8

## 2017-08-13 NOTE — Discharge Summary (Signed)
Physician Discharge Summary  Patient ID: Taylor Bowen MRN: [id number] DOB/AGE: 03-21-1986 32 y.o.  Admit date: 08/11/2017 Discharge date: 08/13/2017  Admission Diagnoses: symptomatic fibroid, desire for pregnancy  Discharge Diagnoses: same + anemia Active Problems:   Post-operative state   Discharged Condition: good  Hospital Course: She underwent an uncomplicated myomectomy, removing a 12 cm fibroid. She lost only 300 cc of blood with the surgery but her hemoglobin dropped about 4 grams on POD #1. She was asymptomatic, so I kept her overnight in spite of her desire to go home. By POD #2 she was having a headache and felt the same way she did the last time her hemoglobin was in the 5 range. It was 5.8 and she was given 2 units of PRBCs. She felt better after the transfusion. She was tolerating po well, having flatus, ambulating, and voiding. She felt ready to go home.  Consults: None  Significant Diagnostic Studies: labs: as above  Treatments: surgery: as above  Discharge Exam: Blood pressure (!) 109/57, pulse 81, temperature 98.5 F (36.9 C), temperature source Oral, resp. rate 16, height 5\' 5"  (1.651 m), weight 68 kg (150 lb), last menstrual period 08/11/2017, SpO2 100 %. General appearance: alert Resp: clear to auscultation bilaterally Cardio: regular rate and rhythm, S1, S2 normal, no murmur, click, rub or gallop GI: soft, non-tender; bowel sounds normal; no masses,  no organomegaly Incision/Wound: clean/dry/intact  Disposition: 01-Home or Self Care   Allergies as of 08/13/2017   No Known Allergies     Medication List    STOP taking these medications   medroxyPROGESTERone 10 MG tablet Commonly known as:  PROVERA     TAKE these medications   etonogestrel 68 MG Impl implant Commonly known as:  NEXPLANON 1 each by Subdermal route once.   famotidine 20 MG tablet Commonly known as:  PEPCID Take 1 tablet (20 mg total) by mouth 2 (two) times daily.   ferrous  sulfate 325 (65 FE) MG tablet Take 1 tablet (325 mg total) by mouth 2 (two) times daily with a meal.   ibuprofen 600 MG tablet Commonly known as:  ADVIL,MOTRIN Take 1 tablet (600 mg total) by mouth every 6 (six) hours as needed for fever or headache.   oxyCODONE-acetaminophen 5-325 MG tablet Commonly known as:  PERCOCET/ROXICET Take 1 tablet by mouth every 3 (three) hours as needed for severe pain.   polyethylene glycol packet Commonly known as:  MIRALAX / GLYCOLAX Take 17 g by mouth daily.      Follow-up Information    [name], MD. Schedule an appointment as soon as possible for a visit in 2 week(s).   Specialty:  Obstetrics and Gynecology Contact information: [place] [address] [phone]           Signed: [name] 08/13/2017, 10:39 AM

## 2017-08-13 NOTE — Progress Notes (Signed)
2 Days Post-Op Procedure(s) (LRB): MYOMECTOMY (N/A) Called by lab for abnormal lab, HGB 5.8 Subjective: Patient reports feeling fatigued, "like I did when I was given blood". .    Objective: I have reviewed patient's vital signs, intake and output and labs. CBC Latest Ref Rng & Units 08/13/2017 08/12/2017 08/01/2017  WBC 4.0 - 10.5 K/uL 7.1 10.9(H) 4.5  Hemoglobin 12.0 - 15.0 g/dL 5.8(LL) 7.6(L) 11.9(L)  Hematocrit 36.0 - 46.0 % 18.0(L) 22.7(L) 37.7  Platelets 150 - 400 K/uL 117(L) 147(L) 204    Temp:  [98.4 F (36.9 C)-99.3 F (37.4 C)] 98.4 F (36.9 C) (01/19 0010) Pulse Rate:  [70-89] 89 (01/19 0655) Resp:  [16-18] 16 (01/19 0655) BP: (99-119)/(50-60) 107/60 (01/19 0655) SpO2:  [100 %] 100 % (01/19 0010)   General: cooperative, fatigued and no distress Resp: clear to auscultation bilaterally Cardio: regular rate and rhythm GI: normal findings: no distention, soft no guarding, incision: clean, dry and intact and minimal dried blood on dressing Extremities: extremities normal, atraumatic, no cyanosis or edema and Homans sign is negative, no sign of DVT Vaginal Bleeding: minimal  Assessment: s/p Procedure(s): MYOMECTOMY (N/A): anemia postsurgical,   Plan: transfuse 2 units, reassess for possible d/c after 4 hr posttransfusion hgb, or tomorrow am.  LOS: 2 days    Taylor Bowen 08/13/2017, 6:59 AM

## 2017-08-14 LAB — TYPE AND SCREEN
ABO/RH(D): A POS
Antibody Screen: NEGATIVE
Unit division: 0
Unit division: 0

## 2017-08-14 LAB — BPAM RBC
Blood Product Expiration Date: 201902152359
Blood Product Expiration Date: 201902152359
ISSUE DATE / TIME: 201901190834
ISSUE DATE / TIME: 201901191120
Unit Type and Rh: 6200
Unit Type and Rh: 6200

## 2017-08-31 ENCOUNTER — Encounter: Payer: Self-pay | Admitting: Obstetrics & Gynecology

## 2017-08-31 ENCOUNTER — Ambulatory Visit (INDEPENDENT_AMBULATORY_CARE_PROVIDER_SITE_OTHER): Payer: Self-pay | Admitting: Obstetrics & Gynecology

## 2017-08-31 ENCOUNTER — Telehealth: Payer: Self-pay | Admitting: *Deleted

## 2017-08-31 VITALS — BP 119/65 | HR 70 | Wt 144.6 lb

## 2017-08-31 DIAGNOSIS — Z9889 Other specified postprocedural states: Secondary | ICD-10-CM

## 2017-08-31 DIAGNOSIS — N760 Acute vaginitis: Secondary | ICD-10-CM

## 2017-08-31 DIAGNOSIS — B9689 Other specified bacterial agents as the cause of diseases classified elsewhere: Secondary | ICD-10-CM

## 2017-08-31 MED ORDER — METRONIDAZOLE 500 MG PO TABS: 500.0000 mg | ORAL_TABLET | Freq: Two times a day (BID) | ORAL | 6 refills | Status: DC

## 2017-08-31 NOTE — Progress Notes (Signed)
   Subjective:    Patient ID: Roland Rack, female    DOB: 05/10/1986, 32 y.o.   MRN: [id number]  HPI 32 yo G0 here for a post op visit. She had a laparotomy and myomectomy 08/11/17. Her pathology was benign. She reports normal bowel and bladder function.  She says that her incision is "burning". She also thinks that she has BV again.   Review of Systems     Objective:   Physical Exam Breathing, conversing, and ambulating normally Well nourished, well hydrated Black female, no apparent distress Abd- benign Incision- healed beautifully Vaginal discharge c/w BV       Assessment & Plan:  Postop - doing well Reassurance regarding the nerve pain at her incision. Rec tincture of time. BV- flagyl with refills given Rec boric acid prn and probiotics daily

## 2017-08-31 NOTE — Telephone Encounter (Signed)
Patient left message on call center line, stating the rx that Dr Hulan Fray prescribed was not at her pharmacy. After verifying which pharmacy it had been sent to it was sent to the wrong pharmacy. Patient to call to have rx transferred.

## 2018-01-23 DIAGNOSIS — Z30017 Encounter for initial prescription of implantable subdermal contraceptive: Secondary | ICD-10-CM | POA: Diagnosis not present

## 2018-01-23 DIAGNOSIS — Z3046 Encounter for surveillance of implantable subdermal contraceptive: Secondary | ICD-10-CM | POA: Diagnosis not present

## 2018-01-23 DIAGNOSIS — Z3200 Encounter for pregnancy test, result unknown: Secondary | ICD-10-CM | POA: Diagnosis not present

## 2018-03-31 ENCOUNTER — Other Ambulatory Visit: Payer: Self-pay

## 2018-03-31 ENCOUNTER — Ambulatory Visit (HOSPITAL_COMMUNITY)
Admission: EM | Admit: 2018-03-31 | Discharge: 2018-03-31 | Disposition: A | Discharge status: Home / Self Care | Payer: Medicaid Other | Attending: Family Medicine | Admitting: Family Medicine

## 2018-03-31 ENCOUNTER — Encounter (HOSPITAL_COMMUNITY): Payer: Self-pay | Admitting: Family Medicine

## 2018-03-31 DIAGNOSIS — D500 Iron deficiency anemia secondary to blood loss (chronic): Secondary | ICD-10-CM | POA: Insufficient documentation

## 2018-03-31 DIAGNOSIS — N390 Urinary tract infection, site not specified: Secondary | ICD-10-CM | POA: Diagnosis present

## 2018-03-31 DIAGNOSIS — R319 Hematuria, unspecified: Secondary | ICD-10-CM | POA: Diagnosis not present

## 2018-03-31 DIAGNOSIS — K219 Gastro-esophageal reflux disease without esophagitis: Secondary | ICD-10-CM | POA: Insufficient documentation

## 2018-03-31 DIAGNOSIS — Z791 Long term (current) use of non-steroidal anti-inflammatories (NSAID): Secondary | ICD-10-CM | POA: Diagnosis not present

## 2018-03-31 DIAGNOSIS — Z87891 Personal history of nicotine dependence: Secondary | ICD-10-CM | POA: Diagnosis not present

## 2018-03-31 DIAGNOSIS — Z79899 Other long term (current) drug therapy: Secondary | ICD-10-CM | POA: Insufficient documentation

## 2018-03-31 LAB — POCT URINALYSIS DIP (DEVICE)
Bilirubin Urine: NEGATIVE
Glucose, UA: NEGATIVE mg/dL
Ketones, ur: NEGATIVE mg/dL
Nitrite: NEGATIVE
Protein, ur: 30 mg/dL — AB
Specific Gravity, Urine: >=1.03 (ref 1.005–1.030)
Urobilinogen, UA: 0.2 mg/dL (ref 0.0–1.0)
pH: 5.5 (ref 5.0–8.0)

## 2018-03-31 LAB — POCT PREGNANCY, URINE: Preg Test, Ur: NEGATIVE

## 2018-03-31 MED ORDER — NITROFURANTOIN MONOHYD MACRO 100 MG PO CAPS: 100.0000 mg | ORAL_CAPSULE | Freq: Two times a day (BID) | ORAL | 0 refills | Status: DC

## 2018-03-31 NOTE — Discharge Instructions (Addendum)
It was nice meeting you!!  Your urine was not indicative of a urinary tract infection We will send it for culture and let you know the results. I will go ahead and treat you based on symptoms and history.

## 2018-03-31 NOTE — ED Provider Notes (Signed)
[place]    CSN: [id number] Arrival date & time: 03/31/18  0801     History   Chief Complaint Chief Complaint  Patient presents with  . Urinary Tract Infection    HPI Taylor Bowen is a 32 y.o. female.   She is a 32 year old female that presents with 1 day of UTI symptoms.  She is complaining of urinary frequency, dysuria, suprapubic pressure.  She denies any vaginal symptoms or fever.  She does have a history of UTI.  Last menstrual cycle was February of this year and she uses Nexplanon for birth control. She is currently sexually active. Denies any concern for STDs.    She is a former smoker      Past Medical History:  Diagnosis Date  . Anemia   . Constipation    Resolved per patient 08/01/17  . Genital warts    acid treatment in 2007  . GERD (gastroesophageal reflux disease)    occasional  . History of blood transfusion 05/25/2017   [place]  . Urinary tract infection     Patient Active Problem List   Diagnosis Date Noted  . Post-operative state 08/11/2017  . Fibroid uterus 05/26/2017  . Anemia due to chronic blood loss 05/25/2017  . GERD (gastroesophageal reflux disease) 01/30/2013  . Supervision of other normal pregnancy 11/07/2012    Past Surgical History:  Procedure Laterality Date  . HERNIA REPAIR     umbilical  . MYOMECTOMY N/A 08/11/2017   Procedure: MYOMECTOMY;  Surgeon: [name], MD;  Location: [facility];  Service: Gynecology;  Laterality: N/A;  . WISDOM TOOTH EXTRACTION      OB History    Gravida  3   Para  2   Term  2   Preterm  0   AB  1   Living  2     SAB  0   TAB  1   Ectopic  0   Multiple  0   Live Births  2            Home Medications    Prior to Admission medications   Medication Sig Start Date End Date Taking? Authorizing Provider  etonogestrel (NEXPLANON) 68 MG IMPL implant 1 each by Subdermal route once.    [provider]  famotidine (PEPCID) 20 MG tablet Take 1 tablet (20 mg  total) by mouth 2 (two) times daily. Patient not taking: Reported on 07/28/2017 07/14/17   [place]idence Lanius A, PA-C  ferrous sulfate 325 (65 FE) MG tablet Take 1 tablet (325 mg total) by mouth 2 (two) times daily with a meal. 05/26/17   Woodroe Mode, MD  ibuprofen (ADVIL,MOTRIN) 600 MG tablet Take 1 tablet (600 mg total) by mouth every 6 (six) hours as needed for fever or headache. Patient not taking: Reported on 08/31/2017 05/26/17   Woodroe Mode, MD  metroNIDAZOLE (FLAGYL) 500 MG tablet Take 1 tablet (500 mg total) by mouth 2 (two) times daily. 08/31/17   [name], MD  nitrofurantoin, macrocrystal-monohydrate, (MACROBID) 100 MG capsule Take 1 capsule (100 mg total) by mouth 2 (two) times daily. 03/31/18   Loura Halt A, NP  oxyCODONE-acetaminophen (PERCOCET/ROXICET) 5-325 MG tablet Take 1 tablet by mouth every 3 (three) hours as needed for severe pain. Patient not taking: Reported on 08/31/2017 08/13/17   [name], MD  polyethylene glycol (MIRALAX / GLYCOLAX) packet Take 17 g by mouth daily. Patient not taking: Reported on 08/31/2017 08/13/17  [name], MD    Family History Family History  Problem Relation Age of Onset  . Anesthesia problems Neg Hx   . Other Neg Hx   . Ataxia Neg Hx   . Chorea Neg Hx   . Dementia Neg Hx   . Mental retardation Neg Hx   . Migraines Neg Hx   . Multiple sclerosis Neg Hx   . Neurofibromatosis Neg Hx   . Neuropathy Neg Hx   . Parkinsonism Neg Hx   . Seizures Neg Hx   . Stroke Neg Hx     Social History Social History   Tobacco Use  . Smoking status: Former Smoker    Packs/day: 1.00    Years: 3.00    Pack years: 3.00    Types: Cigarettes    Last attempt to quit: 02/07/2011    Years since quitting: 7.1  . Smokeless tobacco: Never Used  Substance Use Topics  . Alcohol use: Yes    Comment: occasional  . Drug use: No     Allergies   Patient has no known allergies.   Review of Systems Review of Systems  Constitutional: Negative for  activity change, appetite change, chills, fatigue and fever.  Genitourinary: Positive for dysuria, hematuria and urgency. Negative for vaginal bleeding, vaginal discharge and vaginal pain.     Physical Exam Triage Vital Signs ED Triage Vitals  Enc Vitals Group     BP 03/31/18 0811 129/75     Pulse Rate 03/31/18 0811 70     Resp 03/31/18 0811 18     Temp 03/31/18 0811 98.3 F (36.8 C)     Temp Source 03/31/18 0811 Oral     SpO2 03/31/18 0811 100 %     Weight 03/31/18 0816 153 lb (69.4 kg)     Height --      Head Circumference --      Peak Flow --      Pain Score 03/31/18 0816 6     Pain Loc --      Pain Edu? --      Excl. in [place]? --    No data found.  Updated Vital Signs BP 129/75 (BP Location: Left Arm)   Pulse 70   Temp 98.3 F (36.8 C) (Oral)   Resp 18   Wt 153 lb (69.4 kg)   SpO2 100%   BMI 25.46 kg/m   Visual Acuity Right Eye Distance:   Left Eye Distance:   Bilateral Distance:    Right Eye Near:   Left Eye Near:    Bilateral Near:     Physical Exam   UC Treatments / Results  Labs (all labs ordered are listed, but only abnormal results are displayed) Labs Reviewed  URINE CULTURE - Abnormal; Notable for the following components:      Result Value   Culture   (*)    Value: >=100,000 COLONIES/mL PROTEUS MIRABILIS SUSCEPTIBILITIES TO FOLLOW Performed at [facility], [place] [address], [place], [place] [address]    All other components within normal limits  POCT URINALYSIS DIP (DEVICE) - Abnormal; Notable for the following components:   Hgb urine dipstick MODERATE (*)    Protein, ur 30 (*)    Leukocytes, UA MODERATE (*)    All other components within normal limits  POCT PREGNANCY, URINE    EKG None  Radiology No results found.  Procedures Procedures (including critical care time)  Medications Ordered in UC Medications - No data to display  Initial Impression / Assessment and Plan / UC Course  I have reviewed the triage vital signs  and the nursing notes.  Pertinent labs & imaging results that were available during my care of the patient were reviewed by me and considered in my medical decision making (see chart for details).    Urine showed moderate Hgb. Negative for leuks or nitrites. Will send for culture. Will go ahead and treat with Macrobid based on hx and symptoms.  Follow up as needed for continued or worsening symptoms   Final Clinical Impressions(s) / UC Diagnoses   Final diagnoses:  Hematuria, unspecified type     Discharge Instructions     It was nice meeting you!!  Your urine was not indicative of a urinary tract infection We will send it for culture and let you know the results. I will go ahead and treat you based on symptoms and history.      ED Prescriptions    Medication Sig Dispense Auth. Provider   nitrofurantoin, macrocrystal-monohydrate, (MACROBID) 100 MG capsule Take 1 capsule (100 mg total) by mouth 2 (two) times daily. 10 capsule Loura Halt A, NP     Controlled Substance Prescriptions [place] Controlled Substance Registry consulted? Not Applicable   Orvan July, NP 04/01/18 1014

## 2018-03-31 NOTE — ED Triage Notes (Signed)
Pt states she thinks she has a UTI x 1 day

## 2018-04-01 ENCOUNTER — Encounter (HOSPITAL_COMMUNITY): Payer: Self-pay | Admitting: Family Medicine

## 2018-04-02 LAB — URINE CULTURE: Culture: >=100000 — AB

## 2018-04-03 ENCOUNTER — Telehealth (HOSPITAL_COMMUNITY): Payer: Self-pay

## 2018-04-03 MED ORDER — SULFAMETHOXAZOLE-TRIMETHOPRIM 800-160 MG PO TABS: 1.0000 | ORAL_TABLET | Freq: Two times a day (BID) | ORAL | 0 refills | Status: AC

## 2018-04-03 NOTE — Telephone Encounter (Signed)
Urine culture positive for Proteus Mirabilis. This was not treated at urgent care visit. Patient should stop Macrobid and start Bactrim BID x 5 days. Attempted to reach patient. No answer at this time. Message sent to [facility]

## 2018-04-04 ENCOUNTER — Telehealth (HOSPITAL_COMMUNITY): Payer: Self-pay

## 2018-04-04 NOTE — Telephone Encounter (Signed)
Attempted to reach patient x 2 

## 2018-04-06 ENCOUNTER — Telehealth (HOSPITAL_COMMUNITY): Payer: Self-pay

## 2018-04-06 NOTE — Telephone Encounter (Signed)
Attempted to reach patient x 3. No answer. Letter sent. 

## 2018-06-11 ENCOUNTER — Encounter (HOSPITAL_COMMUNITY): Payer: Self-pay | Admitting: Emergency Medicine

## 2018-06-11 ENCOUNTER — Other Ambulatory Visit: Payer: Self-pay

## 2018-06-11 ENCOUNTER — Ambulatory Visit (HOSPITAL_COMMUNITY)
Admission: EM | Admit: 2018-06-11 | Discharge: 2018-06-11 | Disposition: A | Discharge status: Home / Self Care | Payer: Medicaid Other | Attending: Family Medicine | Admitting: Family Medicine

## 2018-06-11 DIAGNOSIS — N390 Urinary tract infection, site not specified: Secondary | ICD-10-CM | POA: Diagnosis not present

## 2018-06-11 DIAGNOSIS — N760 Acute vaginitis: Secondary | ICD-10-CM | POA: Diagnosis not present

## 2018-06-11 DIAGNOSIS — R3915 Urgency of urination: Secondary | ICD-10-CM | POA: Diagnosis present

## 2018-06-11 LAB — POCT URINALYSIS DIP (DEVICE)
Bilirubin Urine: NEGATIVE
Glucose, UA: NEGATIVE mg/dL
Hgb urine dipstick: NEGATIVE
Ketones, ur: NEGATIVE mg/dL
Nitrite: NEGATIVE
Protein, ur: NEGATIVE mg/dL
Specific Gravity, Urine: 1.025 (ref 1.005–1.030)
Urobilinogen, UA: 0.2 mg/dL (ref 0.0–1.0)
pH: 7.5 (ref 5.0–8.0)

## 2018-06-11 LAB — POCT PREGNANCY, URINE: Preg Test, Ur: NEGATIVE

## 2018-06-11 MED ORDER — FLUCONAZOLE 150 MG PO TABS: ORAL_TABLET | ORAL | 0 refills | Status: DC

## 2018-06-11 MED ORDER — CEPHALEXIN 500 MG PO CAPS: 500.0000 mg | ORAL_CAPSULE | Freq: Four times a day (QID) | ORAL | 0 refills | Status: AC

## 2018-06-11 NOTE — ED Provider Notes (Signed)
[place]    CSN: [id number] Arrival date & time: 06/11/18  1031     History   Chief Complaint Chief Complaint  Patient presents with  . Urinary Urgency    HPI ALYZAE HAWKEY is a 32 y.o. female.   Barbar[place]ean presents with complaints of 1 week of worsening pain with urination as well as with frequency with urination. No pelvic pain, no back pain. No fevers. Was treated for UTI 9/6, culture was resistant to original macrobid prescribed, and had been provided with bactrim which she completed. States symptoms somewhat improved but never resolved. Worsened again over the past week. States has had similar issue with UTI's a few years ago, had them frequently, but had gone a few years without any. No fevers. Also with some white vaginal discharge and itching which started two days ago. No bleeding. No known std exposure. No periods as has an implant. Has been taking AZO, last was yesterday, and drinking a lot of water, which have helped some with symptoms. Hx of constipation, genital warts, gerd, UTI's.     ROS per HPI.      Past Medical History:  Diagnosis Date  . Anemia   . Constipation    Resolved per patient 08/01/17  . Genital warts    acid treatment in 2007  . GERD (gastroesophageal reflux disease)    occasional  . History of blood transfusion 05/25/2017   [place]  . Urinary tract infection     Patient Active Problem List   Diagnosis Date Noted  . Post-operative state 08/11/2017  . Fibroid uterus 05/26/2017  . Anemia due to chronic blood loss 05/25/2017  . GERD (gastroesophageal reflux disease) 01/30/2013  . Supervision of other normal pregnancy 11/07/2012    Past Surgical History:  Procedure Laterality Date  . HERNIA REPAIR     umbilical  . MYOMECTOMY N/A 08/11/2017   Procedure: MYOMECTOMY;  Surgeon: [name], MD;  Location: [facility];  Service: Gynecology;  Laterality: N/A;  . WISDOM TOOTH EXTRACTION      OB History    Gravida  3   Para  2   Term  2   Preterm  0   AB  1   Living  2     SAB  0   TAB  1   Ectopic  0   Multiple  0   Live Births  2            Home Medications    Prior to Admission medications   Medication Sig Start Date End Date Taking? Authorizing Provider  etonogestrel (NEXPLANON) 68 MG IMPL implant 1 each by Subdermal route once.   Yes [provider]  phenazopyridine (PYRIDIUM) 95 MG tablet Take 95 mg by mouth 3 (three) times daily as needed for pain.   Yes [provider]  cephALEXin (KEFLEX) 500 MG capsule Take 1 capsule (500 mg total) by mouth 4 (four) times daily for 7 days. 06/11/18 06/18/18  Zigmund Gottron, NP  fluconazole (DIFLUCAN) 150 MG tablet Take 1 tablet today. Take second tab at completion of antibiotics. 06/11/18   Zigmund Gottron, NP    Family History Family History  Problem Relation Age of Onset  . Anesthesia problems Neg Hx   . Other Neg Hx   . Ataxia Neg Hx   . Chorea Neg Hx   . Dementia Neg Hx   . Mental retardation Neg Hx   . Migraines Neg Hx   .  Multiple sclerosis Neg Hx   . Neurofibromatosis Neg Hx   . Neuropathy Neg Hx   . Parkinsonism Neg Hx   . Seizures Neg Hx   . Stroke Neg Hx     Social History Social History   Tobacco Use  . Smoking status: Former Smoker    Packs/day: 1.00    Years: 3.00    Pack years: 3.00    Types: Cigarettes    Last attempt to quit: 02/07/2011    Years since quitting: 7.3  . Smokeless tobacco: Never Used  Substance Use Topics  . Alcohol use: Yes    Comment: occasional  . Drug use: No     Allergies   Patient has no known allergies.   Review of Systems Review of Systems   Physical Exam Triage Vital Signs ED Triage Vitals  Enc Vitals Group     BP 06/11/18 1112 123/73     Pulse Rate 06/11/18 1112 61     Resp 06/11/18 1112 18     Temp 06/11/18 1112 99 F (37.2 C)     Temp Source 06/11/18 1112 Oral     SpO2 06/11/18 1112 100 %     Weight --      Height --      Head Circumference --        Peak Flow --      Pain Score 06/11/18 1110 8     Pain Loc --      Pain Edu? --      Excl. in [place]? --    No data found.  Updated Vital Signs BP 123/73 (BP Location: Left Arm)   Pulse 61   Temp 99 F (37.2 C) (Oral)   Resp 18   SpO2 100%    Physical Exam  Constitutional: She is oriented to person, place, and time. She appears well-developed and well-nourished. No distress.  Cardiovascular: Normal rate, regular rhythm and normal heart sounds.  Pulmonary/Chest: Effort normal and breath sounds normal.  Abdominal: Soft. There is no tenderness. There is no rigidity, no rebound, no guarding and no CVA tenderness.  Genitourinary:  Genitourinary Comments: Denies sores, lesions, vaginal bleeding; no pelvic pain; gu exam deferred at this time, vaginal self swab collected.    Neurological: She is alert and oriented to person, place, and time.  Skin: Skin is warm and dry.     UC Treatments / Results  Labs (all labs ordered are listed, but only abnormal results are displayed) Labs Reviewed  POCT URINALYSIS DIP (DEVICE) - Abnormal; Notable for the following components:      Result Value   Leukocytes, UA TRACE (*)    All other components within normal limits  URINE CULTURE  CERVICOVAGINAL ANCILLARY ONLY    EKG None  Radiology No results found.  Procedures Procedures (including critical care time)  Medications Ordered in UC Medications - No data to display  Initial Impression / Assessment and Plan / UC Course  I have reviewed the triage vital signs and the nursing notes.  Pertinent labs & imaging results that were available during my care of the patient were reviewed by me and considered in my medical decision making (see chart for details).     Symptoms of UTI present, without complete resolution after treatment in September. Only trace leuks to urine. Will repeat culture and start treatment due to patient history. Keflex provided as last culture was sensitive. Started  treatment for yeast, with vaginal cytology pending. Will notify of any positive  findings and if any changes to treatment are needed.  If symptoms worsen or do not improve in the next week to return to be seen or to follow up with PCP.  Patient verbalized understanding and agreeable to plan.   Final Clinical Impressions(s) / UC Diagnoses   Final diagnoses:  Lower urinary tract infection  Acute vaginitis     Discharge Instructions     I have sent your urine to be cultured to further validate if this is still a urinary tract infection.  We will start antibiotics based on your previous culture, in the mean time.  We will also treat for yeast infection as we wait for your vaginal test result.  Will notify of any positive findings and if any changes to treatment are needed.   If symptoms worsen or do not improve in the next week to return to be seen or to follow up with your PCP.     ED Prescriptions    Medication Sig Dispense Auth. Provider   cephALEXin (KEFLEX) 500 MG capsule Take 1 capsule (500 mg total) by mouth 4 (four) times daily for 7 days. 28 capsule Augusto Gamble B, NP   fluconazole (DIFLUCAN) 150 MG tablet Take 1 tablet today. Take second tab at completion of antibiotics. 2 tablet Zigmund Gottron, NP     Controlled Substance Prescriptions [place] Controlled Substance Registry consulted? Not Applicable   Zigmund Gottron, NP 06/11/18 1219

## 2018-06-11 NOTE — Discharge Instructions (Signed)
I have sent your urine to be cultured to further validate if this is still a urinary tract infection.  We will start antibiotics based on your previous culture, in the mean time.  We will also treat for yeast infection as we wait for your vaginal test result.  Will notify of any positive findings and if any changes to treatment are needed.   If symptoms worsen or do not improve in the next week to return to be seen or to follow up with your PCP.

## 2018-06-11 NOTE — ED Triage Notes (Signed)
The patient presented to the [facility] with a complaint of urinary urgency and dysuria x 1 week. The patient stated that she also has a vaginal discharge.

## 2018-06-12 LAB — CERVICOVAGINAL ANCILLARY ONLY
Bacterial vaginitis: NEGATIVE
Candida vaginitis: NEGATIVE
Chlamydia: NEGATIVE
Neisseria Gonorrhea: NEGATIVE
Trichomonas: NEGATIVE

## 2018-06-13 LAB — URINE CULTURE: Culture: >=100000 — AB

## 2018-06-29 ENCOUNTER — Ambulatory Visit (HOSPITAL_COMMUNITY)
Admission: EM | Admit: 2018-06-29 | Discharge: 2018-06-29 | Disposition: A | Discharge status: Home / Self Care | Payer: Medicaid Other | Attending: Family Medicine | Admitting: Family Medicine

## 2018-06-29 ENCOUNTER — Encounter (HOSPITAL_COMMUNITY): Payer: Self-pay

## 2018-06-29 ENCOUNTER — Other Ambulatory Visit: Payer: Self-pay

## 2018-06-29 DIAGNOSIS — J069 Acute upper respiratory infection, unspecified: Secondary | ICD-10-CM

## 2018-06-29 DIAGNOSIS — Z79899 Other long term (current) drug therapy: Secondary | ICD-10-CM | POA: Diagnosis not present

## 2018-06-29 DIAGNOSIS — B9789 Other viral agents as the cause of diseases classified elsewhere: Secondary | ICD-10-CM | POA: Diagnosis not present

## 2018-06-29 DIAGNOSIS — R05 Cough: Secondary | ICD-10-CM | POA: Diagnosis present

## 2018-06-29 DIAGNOSIS — Z87891 Personal history of nicotine dependence: Secondary | ICD-10-CM | POA: Diagnosis not present

## 2018-06-29 DIAGNOSIS — J029 Acute pharyngitis, unspecified: Secondary | ICD-10-CM | POA: Diagnosis not present

## 2018-06-29 LAB — POCT RAPID STREP A: Streptococcus, Group A Screen (Direct): NEGATIVE

## 2018-06-29 MED ORDER — PSEUDOEPH-BROMPHEN-DM 30-2-10 MG/5ML PO SYRP: 5.0000 mL | ORAL_SOLUTION | Freq: Four times a day (QID) | ORAL | 0 refills | Status: DC | PRN

## 2018-06-29 MED ORDER — CETIRIZINE HCL 10 MG PO CAPS: 10.0000 mg | ORAL_CAPSULE | Freq: Every day | ORAL | 0 refills | Status: DC

## 2018-06-29 MED ORDER — FLUTICASONE PROPIONATE 50 MCG/ACT NA SUSP: 1.0000 | Freq: Every day | NASAL | 0 refills | Status: DC

## 2018-06-29 MED ORDER — IBUPROFEN 600 MG PO TABS: 600.0000 mg | ORAL_TABLET | Freq: Four times a day (QID) | ORAL | 0 refills | Status: DC | PRN

## 2018-06-29 NOTE — ED Provider Notes (Signed)
[place]    CSN: [id number] Arrival date & time: 06/29/18  1513     History   Chief Complaint Chief Complaint  Patient presents with  . Cough    HPI Taylor Bowen is a 32 y.o. female no contributing past medical history, Patient is presenting with URI symptoms- congestion, cough, sore throat.  Today patient has developed hot and cold chills, body aches and headache.  Patient's main complaints are congestion and cough. Symptoms have been going on for 2 days. Patient has tried Alka-Seltzer cold and flu, Tylenol Cold and flu, TheraFlu, with minimal relief. Denies fever, nausea, vomiting, diarrhea. Denies shortness of breath and chest pain.  Denies history of asthma and smoking.  HPI  Past Medical History:  Diagnosis Date  . Anemia   . Constipation    Resolved per patient 08/01/17  . Genital warts    acid treatment in 2007  . GERD (gastroesophageal reflux disease)    occasional  . History of blood transfusion 05/25/2017   [place]  . Urinary tract infection     Patient Active Problem List   Diagnosis Date Noted  . Post-operative state 08/11/2017  . Fibroid uterus 05/26/2017  . Anemia due to chronic blood loss 05/25/2017  . GERD (gastroesophageal reflux disease) 01/30/2013  . Supervision of other normal pregnancy 11/07/2012    Past Surgical History:  Procedure Laterality Date  . HERNIA REPAIR     umbilical  . MYOMECTOMY N/A 08/11/2017   Procedure: MYOMECTOMY;  Surgeon: [name], MD;  Location: [facility];  Service: Gynecology;  Laterality: N/A;  . WISDOM TOOTH EXTRACTION      OB History    Gravida  3   Para  2   Term  2   Preterm  0   AB  1   Living  2     SAB  0   TAB  1   Ectopic  0   Multiple  0   Live Births  2            Home Medications    Prior to Admission medications   Medication Sig Start Date End Date Taking? Authorizing Provider  brompheniramine-pseudoephedrine-DM 30-2-10 MG/5ML syrup Take 5 mLs by mouth 4 (four)  times daily as needed. 06/29/18   Ozetta Flatley C, PA-C  Cetirizine HCl 10 MG CAPS Take 1 capsule (10 mg total) by mouth daily for 10 days. 06/29/18 07/09/18  Inza Mikrut C, PA-C  etonogestrel (NEXPLANON) 68 MG IMPL implant 1 each by Subdermal route once.    [provider]  fluticasone (FLONASE) 50 MCG/ACT nasal spray Place 1-2 sprays into both nostrils daily for 7 days. 06/29/18 07/06/18  Tanuj Mullens C, PA-C  ibuprofen (ADVIL,MOTRIN) 600 MG tablet Take 1 tablet (600 mg total) by mouth every 6 (six) hours as needed. 06/29/18   Alleya Demeter C, PA-C  phenazopyridine (PYRIDIUM) 95 MG tablet Take 95 mg by mouth 3 (three) times daily as needed for pain.    [provider]    Family History Family History  Problem Relation Age of Onset  . Anesthesia problems Neg Hx   . Other Neg Hx   . Ataxia Neg Hx   . Chorea Neg Hx   . Dementia Neg Hx   . Mental retardation Neg Hx   . Migraines Neg Hx   . Multiple sclerosis Neg Hx   . Neurofibromatosis Neg Hx   . Neuropathy Neg Hx   . Parkinsonism Neg Hx   .  Seizures Neg Hx   . Stroke Neg Hx     Social History Social History   Tobacco Use  . Smoking status: Former Smoker    Packs/day: 1.00    Years: 3.00    Pack years: 3.00    Types: Cigarettes    Last attempt to quit: 02/07/2011    Years since quitting: 7.3  . Smokeless tobacco: Never Used  Substance Use Topics  . Alcohol use: Yes    Comment: occasional  . Drug use: No     Allergies   Patient has no known allergies.   Review of Systems Review of Systems  Constitutional: Positive for chills. Negative for activity change, appetite change, fatigue and fever.  HENT: Positive for congestion, rhinorrhea, sinus pressure and sore throat. Negative for ear pain and trouble swallowing.   Eyes: Negative for discharge and redness.  Respiratory: Positive for cough. Negative for chest tightness and shortness of breath.   Cardiovascular: Negative for chest pain.    Gastrointestinal: Negative for abdominal pain, diarrhea, nausea and vomiting.  Musculoskeletal: Negative for myalgias.  Skin: Negative for rash.  Neurological: Positive for headaches. Negative for dizziness and light-headedness.     Physical Exam Triage Vital Signs ED Triage Vitals [06/29/18 1538]  Enc Vitals Group     BP 129/76     Pulse Rate 68     Resp 18     Temp 98.6 F (37 C)     Temp Source Oral     SpO2 100 %     Weight 150 lb (68 kg)     Height      Head Circumference      Peak Flow      Pain Score 0     Pain Loc      Pain Edu?      Excl. in [place]?    No data found.  Updated Vital Signs BP 129/76 (BP Location: Right Arm)   Pulse 68   Temp 98.6 F (37 C) (Oral)   Resp 18   Wt 150 lb (68 kg)   SpO2 100%   BMI 24.96 kg/m   Visual Acuity Right Eye Distance:   Left Eye Distance:   Bilateral Distance:    Right Eye Near:   Left Eye Near:    Bilateral Near:     Physical Exam  Constitutional: She appears well-developed and well-nourished. No distress.  HENT:  Head: Normocephalic and atraumatic.  Bilateral ears without tenderness to palpation of external auricle, tragus and mastoid, EAC's without erythema or swelling, TM's with good bony landmarks and cone of light. Non erythematous.  Nasal mucosa erythematous with swollen turbinates bilaterally, clear rhinorrhea present bilateral nares  Oral mucosa pink and moist, no tonsillar enlargement or exudate. Posterior pharynx patent and erythematous, no uvula deviation or swelling. Normal phonation.  Eyes: Conjunctivae are normal.  Neck: Neck supple.  Cardiovascular: Normal rate and regular rhythm.  No murmur heard. Pulmonary/Chest: Effort normal and breath sounds normal. No respiratory distress.  Breathing comfortably at rest, CTABL, no wheezing, rales or other adventitious sounds auscultated  Abdominal: Soft. There is no tenderness.  Musculoskeletal: She exhibits no edema.  Neurological: She is alert.   Skin: Skin is warm and dry.  Psychiatric: She has a normal mood and affect.  Nursing note and vitals reviewed.    UC Treatments / Results  Labs (all labs ordered are listed, but only abnormal results are displayed) Labs Reviewed  CULTURE, GROUP A STREP [facility])  POCT RAPID  STREP A    EKG None  Radiology No results found.  Procedures Procedures (including critical care time)  Medications Ordered in UC Medications - No data to display  Initial Impression / Assessment and Plan / UC Course  I have reviewed the triage vital signs and the nursing notes.  Pertinent labs & imaging results that were available during my care of the patient were reviewed by me and considered in my medical decision making (see chart for details).     Strep test negative, URI symptoms x2 days.  Most likely viral etiology.  Vital signs stable, exam unremarkable.  Will treat symptomatically and supportively.  Recommendations below.Discussed strict return precautions. Patient verbalized understanding and is agreeable with plan.  Final Clinical Impressions(s) / UC Diagnoses   Final diagnoses:  Viral URI with cough     Discharge Instructions     Your symptoms are most likely viral and should resolve over the next 5 to 7 days Please begin daily allergy pill like Zyrtec or Claritin to help with congestion and drainage Use Flonase nasal spray 1 to 2 sprays in each nostril to help with congestion as well Cough syrup as needed for cough and congestion Alternate Tylenol and ibuprofen every 4 hours to help with fevers, body aches and any hot and cold chills Drink plenty of fluids  Follow-up if symptoms not resolving as expected, developing difficulty breathing, shortness of breath, high fever/persistent fever   ED Prescriptions    Medication Sig Dispense Auth. Provider   Cetirizine HCl 10 MG CAPS Take 1 capsule (10 mg total) by mouth daily for 10 days. 10 capsule Abass Misener C, PA-C   fluticasone  (FLONASE) 50 MCG/ACT nasal spray Place 1-2 sprays into both nostrils daily for 7 days. 1 g Rhealyn Cullen C, PA-C   brompheniramine-pseudoephedrine-DM 30-2-10 MG/5ML syrup Take 5 mLs by mouth 4 (four) times daily as needed. 120 mL Kashmere Staffa C, PA-C   ibuprofen (ADVIL,MOTRIN) 600 MG tablet Take 1 tablet (600 mg total) by mouth every 6 (six) hours as needed. 30 tablet Ariez Neilan, [place] C, PA-C     Controlled Substance Prescriptions [place] Controlled Substance Registry consulted? Not Applicable   Janith Lima, [place] 06/29/18 1700

## 2018-06-29 NOTE — ED Triage Notes (Signed)
Pt  Cc chills and coughing x 2 days.

## 2018-06-29 NOTE — Discharge Instructions (Signed)
Your symptoms are most likely viral and should resolve over the next 5 to 7 days Please begin daily allergy pill like Zyrtec or Claritin to help with congestion and drainage Use Flonase nasal spray 1 to 2 sprays in each nostril to help with congestion as well Cough syrup as needed for cough and congestion Alternate Tylenol and ibuprofen every 4 hours to help with fevers, body aches and any hot and cold chills Drink plenty of fluids  Follow-up if symptoms not resolving as expected, developing difficulty breathing, shortness of breath, high fever/persistent fever

## 2018-07-02 LAB — CULTURE, GROUP A STREP (THRC)

## 2018-10-23 ENCOUNTER — Telehealth: Payer: Self-pay | Admitting: Obstetrics & Gynecology

## 2018-10-23 NOTE — Telephone Encounter (Signed)
Patient is requesting a referral to get her mammogram.

## 2018-10-23 NOTE — Telephone Encounter (Signed)
Pt reports for the past 3 weeks she has had breast pain in both breasts and they are hard and heavy with a throbbing pain.  Pt reports they are very sensitive.  Unable to tell if there is a lump d/t the hardness of them.  Pt reports that she feels that her breasts feel as if she just delivered a baby but she hasn't.  Advised pt that in order to get an order for a mammogram, she will have to speak with a provider first.  Advised her that I would contact the front office staff and they would schedule her.  Pt verbalized understanding.

## 2018-10-25 ENCOUNTER — Other Ambulatory Visit (HOSPITAL_COMMUNITY)
Admission: RE | Admit: 2018-10-25 | Discharge: 2018-10-25 | Disposition: A | Discharge status: Home / Self Care | Payer: Medicaid Other | Source: Ambulatory Visit | Attending: Obstetrics & Gynecology | Admitting: Obstetrics & Gynecology

## 2018-10-25 ENCOUNTER — Other Ambulatory Visit: Payer: Self-pay | Admitting: Obstetrics & Gynecology

## 2018-10-25 ENCOUNTER — Other Ambulatory Visit: Payer: Self-pay

## 2018-10-25 ENCOUNTER — Ambulatory Visit (INDEPENDENT_AMBULATORY_CARE_PROVIDER_SITE_OTHER): Payer: Medicaid Other | Admitting: Obstetrics & Gynecology

## 2018-10-25 VITALS — BP 147/99 | HR 87 | Wt 146.9 lb

## 2018-10-25 DIAGNOSIS — N644 Mastodynia: Secondary | ICD-10-CM

## 2018-10-25 DIAGNOSIS — Z01419 Encounter for gynecological examination (general) (routine) without abnormal findings: Secondary | ICD-10-CM | POA: Insufficient documentation

## 2018-10-25 DIAGNOSIS — Z0000 Encounter for general adult medical examination without abnormal findings: Secondary | ICD-10-CM

## 2018-10-25 DIAGNOSIS — Z23 Encounter for immunization: Secondary | ICD-10-CM

## 2018-10-25 LAB — POCT PREGNANCY, URINE: Preg Test, Ur: NEGATIVE

## 2018-10-25 MED ORDER — IBUPROFEN 600 MG PO TABS: ORAL_TABLET | ORAL | 1 refills | Status: DC

## 2018-10-25 NOTE — Progress Notes (Signed)
   Subjective:    Patient ID: Taylor Bowen, female    DOB: 12-05-1985, 33 y.o.   MRN: [id number]  HPI 33yo single P2 (20 and 31 yo kids) here today with the issue of 3 weeks of severe bilateral breast pain. She says that this feels like when you are pregnant and they get heavy and sore after delivery. She did not breast feed and has not seen any milky or other breast discharge. FH- + breast cancer in a third cousin and great aunt (paternal), no gyn or colon cancer She uses Nexplanon for contraception and has no periods. She takes IBU for her headaches, usually 800 mg or 2 times per week. This does not help the breast soreness.  Review of Systems     Objective:   Physical Exam Breathing, conversing, and ambulating normally Well nourished, well hydrated Black female, no apparent distress Breast exam- normal bilaterally with no nipple discharge, skin changes, masses, or lymphadenopathy Cervix appears normal     Assessment & Plan:  Preventative care- pap smear today New onset BP elevation- refer to fam med [employer] series,  #2 today Breast pain- mammogram ordered Start IBU 600 mg q8 hours RTC Check UPT

## 2018-10-27 LAB — CYTOLOGY - PAP
Chlamydia: NEGATIVE
Diagnosis: NEGATIVE
HPV: NOT DETECTED
Neisseria Gonorrhea: NEGATIVE

## 2018-11-06 ENCOUNTER — Other Ambulatory Visit: Payer: Medicaid Other

## 2018-11-10 ENCOUNTER — Ambulatory Visit
Admission: RE | Admit: 2018-11-10 | Discharge: 2018-11-10 | Disposition: A | Discharge status: Home / Self Care | Payer: Medicaid Other | Source: Ambulatory Visit | Attending: Obstetrics & Gynecology | Admitting: Obstetrics & Gynecology

## 2018-11-10 ENCOUNTER — Ambulatory Visit: Payer: Medicaid Other

## 2018-11-10 ENCOUNTER — Other Ambulatory Visit: Payer: Self-pay

## 2018-11-10 DIAGNOSIS — N644 Mastodynia: Secondary | ICD-10-CM | POA: Diagnosis not present

## 2018-11-17 ENCOUNTER — Encounter (HOSPITAL_COMMUNITY): Payer: Self-pay | Admitting: Emergency Medicine

## 2018-11-17 ENCOUNTER — Ambulatory Visit (HOSPITAL_COMMUNITY)
Admission: EM | Admit: 2018-11-17 | Discharge: 2018-11-17 | Disposition: A | Discharge status: Home / Self Care | Payer: Medicaid Other | Attending: Family Medicine | Admitting: Family Medicine

## 2018-11-17 ENCOUNTER — Other Ambulatory Visit: Payer: Self-pay

## 2018-11-17 DIAGNOSIS — N760 Acute vaginitis: Secondary | ICD-10-CM | POA: Insufficient documentation

## 2018-11-17 DIAGNOSIS — R300 Dysuria: Secondary | ICD-10-CM | POA: Diagnosis not present

## 2018-11-17 DIAGNOSIS — Z3202 Encounter for pregnancy test, result negative: Secondary | ICD-10-CM | POA: Diagnosis not present

## 2018-11-17 DIAGNOSIS — N898 Other specified noninflammatory disorders of vagina: Secondary | ICD-10-CM | POA: Diagnosis not present

## 2018-11-17 LAB — POCT URINALYSIS DIP (DEVICE)
Bilirubin Urine: NEGATIVE
Glucose, UA: NEGATIVE mg/dL
Ketones, ur: NEGATIVE mg/dL
Nitrite: NEGATIVE
Protein, ur: NEGATIVE mg/dL
Specific Gravity, Urine: 1.02 (ref 1.005–1.030)
Urobilinogen, UA: 0.2 mg/dL (ref 0.0–1.0)
pH: 7 (ref 5.0–8.0)

## 2018-11-17 LAB — POCT PREGNANCY, URINE: Preg Test, Ur: NEGATIVE

## 2018-11-17 MED ORDER — FLUCONAZOLE 150 MG PO TABS: 150.0000 mg | ORAL_TABLET | Freq: Every day | ORAL | 0 refills | Status: DC

## 2018-11-17 MED ORDER — NITROFURANTOIN MONOHYD MACRO 100 MG PO CAPS: 100.0000 mg | ORAL_CAPSULE | Freq: Two times a day (BID) | ORAL | 0 refills | Status: DC

## 2018-11-17 NOTE — ED Triage Notes (Signed)
PT presents to [facility] for assessment of vaginal discharge, some "tingling" with urination.  Pt is sexually active, does not use protection, has the implant and has not had a menstrual period x 1 year.

## 2018-11-17 NOTE — ED Provider Notes (Signed)
[place]    CSN: [id number] Arrival date & time: 11/17/18  4696     History   Chief Complaint Chief Complaint  Patient presents with  . Vaginal Discharge    HPI NORMAN BIER is a 33 y.o. female.   Patient is a 33 year old female who presents today with mild vaginal discharge, itching  and some dysuria.  Symptoms have been constant and remain the same.  Symptoms been present over the past couple days. No treatment for the symptoms.  Patient does have a history of UTIs.  Patient is concerned about STDs due to unprotected sex approximately 2 weeks ago.  Denies any associated abdominal pain, back pain, fevers.  She has not had a menstrual cycle in approximately 1 year.  She has the implant for birth control.  ROS per HPI      Past Medical History:  Diagnosis Date  . Anemia   . Constipation    Resolved per patient 08/01/17  . Genital warts    acid treatment in 2007  . GERD (gastroesophageal reflux disease)    occasional  . History of blood transfusion 05/25/2017   [place]  . Urinary tract infection     Patient Active Problem List   Diagnosis Date Noted  . Post-operative state 08/11/2017  . Fibroid uterus 05/26/2017  . Anemia due to chronic blood loss 05/25/2017  . GERD (gastroesophageal reflux disease) 01/30/2013  . Supervision of other normal pregnancy 11/07/2012    Past Surgical History:  Procedure Laterality Date  . HERNIA REPAIR     umbilical  . MYOMECTOMY N/A 08/11/2017   Procedure: MYOMECTOMY;  Surgeon: [name], MD;  Location: [facility];  Service: Gynecology;  Laterality: N/A;  . WISDOM TOOTH EXTRACTION      OB History    Gravida  3   Para  2   Term  2   Preterm  0   AB  1   Living  2     SAB  0   TAB  1   Ectopic  0   Multiple  0   Live Births  2            Home Medications    Prior to Admission medications   Medication Sig Start Date End Date Taking? Authorizing Provider  Cetirizine HCl 10 MG CAPS Take 1  capsule (10 mg total) by mouth daily for 10 days. 06/29/18 07/09/18  Wieters, Hallie C, PA-C  etonogestrel (NEXPLANON) 68 MG IMPL implant 1 each by Subdermal route once.    [provider]  fluconazole (DIFLUCAN) 150 MG tablet Take 1 tablet (150 mg total) by mouth daily. 11/17/18   Patrice Matthew, Tressia Miners A, NP  fluticasone (FLONASE) 50 MCG/ACT nasal spray Place 1-2 sprays into both nostrils daily for 7 days. 06/29/18 07/06/18  Wieters, Hallie C, PA-C  ibuprofen (ADVIL,MOTRIN) 600 MG tablet Take 1 tablet (600 mg total) by mouth every 6 (six) hours as needed. 06/29/18   Wieters, Hallie C, PA-C  ibuprofen (ADVIL,MOTRIN) 600 MG tablet Take 1 pill RTC q 8 hours for 2 weeks 10/25/18   Clovia Cuff C, MD  nitrofurantoin, macrocrystal-monohydrate, (MACROBID) 100 MG capsule Take 1 capsule (100 mg total) by mouth 2 (two) times daily. 11/17/18   Orvan July, NP    Family History Family History  Problem Relation Age of Onset  . Anesthesia problems Neg Hx   . Other Neg Hx   . Ataxia Neg Hx   . Chorea Neg  Hx   . Dementia Neg Hx   . Mental retardation Neg Hx   . Migraines Neg Hx   . Multiple sclerosis Neg Hx   . Neurofibromatosis Neg Hx   . Neuropathy Neg Hx   . Parkinsonism Neg Hx   . Seizures Neg Hx   . Stroke Neg Hx     Social History Social History   Tobacco Use  . Smoking status: Former Smoker    Packs/day: 1.00    Years: 3.00    Pack years: 3.00    Types: Cigarettes    Last attempt to quit: 02/07/2011    Years since quitting: 7.7  . Smokeless tobacco: Never Used  Substance Use Topics  . Alcohol use: Yes    Comment: occasional  . Drug use: No     Allergies   Patient has no known allergies.   Review of Systems Review of Systems   Physical Exam Triage Vital Signs ED Triage Vitals  Enc Vitals Group     BP 11/17/18 0928 133/71     Pulse Rate 11/17/18 0928 90     Resp 11/17/18 0928 16     Temp 11/17/18 0928 98.4 F (36.9 C)     Temp Source 11/17/18 0928 Oral     SpO2 11/17/18  0928 100 %     Weight --      Height --      Head Circumference --      Peak Flow --      Pain Score 11/17/18 0932 0     Pain Loc --      Pain Edu? --      Excl. in [place]? --    No data found.  Updated Vital Signs BP 133/71 (BP Location: Left Arm)   Pulse 90   Temp 98.4 F (36.9 C) (Oral)   Resp 16   SpO2 100%   Visual Acuity Right Eye Distance:   Left Eye Distance:   Bilateral Distance:    Right Eye Near:   Left Eye Near:    Bilateral Near:     Physical Exam Vitals signs and nursing note reviewed.  Constitutional:      General: She is not in acute distress.    Appearance: Normal appearance. She is not ill-appearing, toxic-appearing or diaphoretic.  HENT:     Head: Normocephalic.     Nose: Nose normal.     Mouth/Throat:     Pharynx: Oropharynx is clear.  Eyes:     Conjunctiva/sclera: Conjunctivae normal.  Neck:     Musculoskeletal: Normal range of motion.  Pulmonary:     Effort: Pulmonary effort is normal.  Abdominal:     Palpations: Abdomen is soft.     Tenderness: There is no abdominal tenderness.  Musculoskeletal: Normal range of motion.  Skin:    General: Skin is warm and dry.     Findings: No rash.  Neurological:     Mental Status: She is alert.  Psychiatric:        Mood and Affect: Mood normal.      UC Treatments / Results  Labs (all labs ordered are listed, but only abnormal results are displayed) Labs Reviewed  POCT URINALYSIS DIP (DEVICE) - Abnormal; Notable for the following components:      Result Value   Hgb urine dipstick TRACE (*)    Leukocytes,Ua TRACE (*)    All other components within normal limits  URINE CULTURE  POC URINE PREG, ED  POCT PREGNANCY, URINE  CERVICOVAGINAL ANCILLARY ONLY    EKG None  Radiology No results found.  Procedures Procedures (including critical care time)  Medications Ordered in UC Medications - No data to display  Initial Impression / Assessment and Plan / UC Course  I have reviewed the  triage vital signs and the nursing notes.  Pertinent labs & imaging results that were available during my care of the patient were reviewed by me and considered in my medical decision making (see chart for details).     Urine was negative for pregnancy Urinalysis showed trace leuks and trace blood Sending for culture and will go ahead and treat for possible urinary tract infection pending results.  Also treating for possible yeast infection based on symptoms Sending self swab for STD testing Labs pending and instructed that we will call with any positive results and treat as needed Pt understanding and agrees  Final Clinical Impressions(s) / UC Diagnoses   Final diagnoses:  Acute vaginitis  Dysuria     Discharge Instructions     We will go ahead and treat you today for urinary tract infection and yeast infection Urine was sent for culture and swab sent for further STD testing We will call you with any positive results and treat as needed    ED Prescriptions    Medication Sig Dispense Auth. Provider   nitrofurantoin, macrocrystal-monohydrate, (MACROBID) 100 MG capsule Take 1 capsule (100 mg total) by mouth 2 (two) times daily. 10 capsule Adie Vilar A, NP   fluconazole (DIFLUCAN) 150 MG tablet Take 1 tablet (150 mg total) by mouth daily. 2 tablet Loura Halt A, NP     Controlled Substance Prescriptions [place] Controlled Substance Registry consulted? Not Applicable   Orvan July, NP 11/17/18 1040

## 2018-11-17 NOTE — Discharge Instructions (Addendum)
We will go ahead and treat you today for urinary tract infection and yeast infection Urine was sent for culture and swab sent for further STD testing We will call you with any positive results and treat as needed

## 2018-11-17 NOTE — ED Notes (Signed)
Patient able to ambulate independently  

## 2018-11-18 LAB — URINE CULTURE: Culture: NO GROWTH

## 2018-11-20 LAB — CERVICOVAGINAL ANCILLARY ONLY
Bacterial vaginitis: NEGATIVE
Candida vaginitis: NEGATIVE
Chlamydia: NEGATIVE
Neisseria Gonorrhea: NEGATIVE
Trichomonas: NEGATIVE

## 2019-04-05 ENCOUNTER — Telehealth: Payer: Medicaid Other | Admitting: Physician Assistant

## 2019-04-05 DIAGNOSIS — B3731 Acute candidiasis of vulva and vagina: Secondary | ICD-10-CM

## 2019-04-05 DIAGNOSIS — B373 Candidiasis of vulva and vagina: Secondary | ICD-10-CM | POA: Diagnosis not present

## 2019-04-05 MED ORDER — FLUCONAZOLE 150 MG PO TABS: 150.0000 mg | ORAL_TABLET | Freq: Once | ORAL | 0 refills | Status: AC

## 2019-04-05 MED ORDER — FLUCONAZOLE 150 MG PO TABS: 150.0000 mg | ORAL_TABLET | Freq: Once | ORAL | 0 refills | Status: DC

## 2019-04-05 NOTE — Progress Notes (Addendum)
We are sorry that you are not feeling well. Here is how we plan to help! Based on what you shared with me it looks like you: May have a yeast vaginosis  Vaginosis is an inflammation of the vagina that can result in discharge, itching and pain. The cause is usually a change in the normal balance of vaginal bacteria or an infection. Vaginosis can also result from reduced estrogen levels after menopause.  The most common causes of vaginosis are:   Bacterial vaginosis which results from an overgrowth of one on several organisms that are normally present in your vagina.   I spent about 5 minutes on this chart  Yeast infections which are caused by a naturally occurring fungus called candida.   Vaginal atrophy (atrophic vaginosis) which results from the thinning of the vagina from reduced estrogen levels after menopause.   Trichomoniasis which is caused by a parasite and is commonly transmitted by sexual intercourse.  Factors that increase your risk of developing vaginosis include: Marland Kitchen Medications, such as antibiotics and steroids . Uncontrolled diabetes . Use of hygiene products such as bubble bath, vaginal spray or vaginal deodorant . Douching . Wearing damp or tight-fitting clothing . Using an intrauterine device (IUD) for birth control . Hormonal changes, such as those associated with pregnancy, birth control pills or menopause . Sexual activity . Having a sexually transmitted infection  Your treatment plan is A single Diflucan (fluconazole) 150mg  tablet once.  I have electronically sent this prescription into the pharmacy that you have chosen.  Be sure to take all of the medication as directed. Stop taking any medication if you develop a rash, tongue swelling or shortness of breath. Mothers who are breast feeding should consider pumping and discarding their breast milk while on these antibiotics. However, there is no consensus that infant exposure at these doses would be harmful.  Remember  that medication creams can weaken latex condoms. Marland Kitchen   HOME CARE:  Good hygiene may prevent some types of vaginosis from recurring and may relieve some symptoms:  . Avoid baths, hot tubs and whirlpool spas. Rinse soap from your outer genital area after a shower, and dry the area well to prevent irritation. Don't use scented or harsh soaps, such as those with deodorant or antibacterial action. Marland Kitchen Avoid irritants. These include scented tampons and pads. . Wipe from front to back after using the toilet. Doing so avoids spreading fecal bacteria to your vagina.  Other things that may help prevent vaginosis include:  Marland Kitchen Don't douche. Your vagina doesn't require cleansing other than normal bathing. Repetitive douching disrupts the normal organisms that reside in the vagina and can actually increase your risk of vaginal infection. Douching won't clear up a vaginal infection. . Use a latex condom. Both female and female latex condoms may help you avoid infections spread by sexual contact. . Wear cotton underwear. Also wear pantyhose with a cotton crotch. If you feel comfortable without it, skip wearing underwear to bed. Yeast thrives in [employer] Your symptoms should improve in the next day or two.  GET HELP RIGHT AWAY IF:  . You have pain in your lower abdomen ( pelvic area or over your ovaries) . You develop nausea or vomiting . You develop a fever . Your discharge changes or worsens . You have persistent pain with intercourse . You develop shortness of breath, a rapid pulse, or you faint.  These symptoms could be signs of problems or infections that need to be evaluated by a  medical provider now.  MAKE SURE YOU    Understand these instructions.  Will watch your condition.  Will get help right away if you are not doing well or get worse.   Your e-visit answers were reviewed by a board certified advanced clinical practitioner to complete your personal care plan. Depending upon the  condition, your plan could have included both over the counter or prescription medications. Please review your pharmacy choice to make sure that you have choses a pharmacy that is open for you to pick up any needed prescription, Your safety is important to [place]. If you have drug allergies check your prescription carefully.  You can use MyChart to ask questions about today's visit, request a non-urgent call back, or ask for a work or school excuse. You will get a MyChart message within the next two days asking about your experience. I hope that your e-visit has been valuable and will speed your recovery.   A total of 5 minutes was spent on this patient's chart

## 2019-04-05 NOTE — Addendum Note (Signed)
Addended by: Madilyn Hook A on: 04/05/2019 03:44 PM   Modules accepted: Orders

## 2019-04-25 ENCOUNTER — Telehealth: Payer: Self-pay | Admitting: Family Medicine

## 2019-04-25 NOTE — Telephone Encounter (Signed)
Attempted to call patient about her appointment on 10/1 @ 10:40. No answer left voicemail instructing patient to wear a face mask for the entire appointment and no visitors are allowed during the visit. Patient instructed not to attend the appointment if she was any symptoms. Symptom list and office number left.

## 2019-04-26 ENCOUNTER — Other Ambulatory Visit: Payer: Self-pay

## 2019-04-26 ENCOUNTER — Ambulatory Visit (INDEPENDENT_AMBULATORY_CARE_PROVIDER_SITE_OTHER): Payer: Medicaid Other

## 2019-04-26 ENCOUNTER — Ambulatory Visit: Payer: Medicaid Other

## 2019-04-26 VITALS — BP 128/81 | HR 69 | Wt 153.9 lb

## 2019-04-26 DIAGNOSIS — Z23 Encounter for immunization: Secondary | ICD-10-CM

## 2019-04-26 NOTE — Progress Notes (Signed)
Taylor Bowen here for third Gardasil-9  Injection. Vitals taken. VIS given; pt denies any prior reaction. Injection administered without complication. Pt has no questions or concerns. Pt will return to office as needed.  Annabell Howells, RN 04/26/2019  11:36 AM

## 2019-04-26 NOTE — Progress Notes (Signed)
I agree with the nurses note and plan of care.  Noni Saupe I, NP 04/26/2019 4:02 PM

## 2019-05-18 ENCOUNTER — Telehealth: Payer: Medicaid Other | Admitting: Nurse Practitioner

## 2019-05-18 DIAGNOSIS — N390 Urinary tract infection, site not specified: Secondary | ICD-10-CM

## 2019-05-18 MED ORDER — CEPHALEXIN 500 MG PO CAPS: 500.0000 mg | ORAL_CAPSULE | Freq: Two times a day (BID) | ORAL | 0 refills | Status: AC

## 2019-05-18 NOTE — Progress Notes (Signed)
We are sorry that you are not feeling well.  Here is how we plan to help!  Based on what you shared with me it looks like you most likely have a simple urinary tract infection.  A UTI (Urinary Tract Infection) is a bacterial infection of the bladder.  Most cases of urinary tract infections are simple to treat but a key part of your care is to encourage you to drink plenty of fluids and watch your symptoms carefully.  I have prescribed Keflex 500 mg twice a day for 7 days.  Your symptoms should gradually improve. Call us if the burning in your urine worsens, you develop worsening fever, back pain or pelvic pain or if your symptoms do not resolve after completing the antibiotic.  Urinary tract infections can be prevented by drinking plenty of water to keep your body hydrated.  Also be sure when you wipe, wipe from front to back and don't hold it in!  If possible, empty your bladder every 4 hours. If your symptoms do not improve within 24-48 hours or suddenly worsen, please go the nearest emergency department for further evaluation.  Your e-visit answers were reviewed by a board certified advanced clinical practitioner to complete your personal care plan.  Depending on the condition, your plan could have included both over the counter or prescription medications.  If there is a problem please reply  once you have received a response from your provider.  Your safety is important to [place].  If you have drug allergies check your prescription carefully.    You can use MyChart to ask questions about today's visit, request a non-urgent call back, or ask for a work or school excuse for 24 hours related to this e-Visit. If it has been greater than 24 hours you will need to follow up with your provider, or enter a new e-Visit to address those concerns.   You will get an e-mail in the next two days asking about your experience.  I hope that your e-visit has been valuable and will speed your recovery. Thank you  for using e-visits.  I have spent 5-10 minutes reviewing and documenting in the patient's chart.

## 2019-06-13 DIAGNOSIS — Z20828 Contact with and (suspected) exposure to other viral communicable diseases: Secondary | ICD-10-CM | POA: Diagnosis not present

## 2019-06-14 ENCOUNTER — Telehealth: Payer: Medicaid Other | Admitting: Emergency Medicine

## 2019-06-14 DIAGNOSIS — R300 Dysuria: Secondary | ICD-10-CM | POA: Diagnosis not present

## 2019-06-14 MED ORDER — SULFAMETHOXAZOLE-TRIMETHOPRIM 800-160 MG PO TABS: 1.0000 | ORAL_TABLET | Freq: Two times a day (BID) | ORAL | 0 refills | Status: DC

## 2019-06-14 NOTE — Progress Notes (Signed)
We are sorry that you are not feeling well.  Here is how we plan to help!  Based on what you shared with me it looks like you most likely have a simple urinary tract infection.  A UTI (Urinary Tract Infection) is a bacterial infection of the bladder.  Most cases of urinary tract infections are simple to treat but a key part of your care is to encourage you to drink plenty of fluids and watch your symptoms carefully.  I have prescribed Bactrim DS One tablet twice a day for 5 days.  Your symptoms should gradually improve. Call us if the burning in your urine worsens, you develop worsening fever, back pain or pelvic pain or if your symptoms do not resolve after completing the antibiotic.   It is ultimately my recommendation that you be seen by a provider you be seen in person and have a UA and Culture because on review of all of your previous cultures, you have not had resistance to Keflex. I am going to prescribe Bactrim.    Urinary tract infections can be prevented by drinking plenty of water to keep your body hydrated.  Also be sure when you wipe, wipe from front to back and don't hold it in!  If possible, empty your bladder every 4 hours.  Your e-visit answers were reviewed by a board certified advanced clinical practitioner to complete your personal care plan.  Depending on the condition, your plan could have included both over the counter or prescription medications.  If there is a problem please reply  once you have received a response from your provider.  Your safety is important to [place].  If you have drug allergies check your prescription carefully.    You can use MyChart to ask questions about today's visit, request a non-urgent call back, or ask for a work or school excuse for 24 hours related to this e-Visit. If it has been greater than 24 hours you will need to follow up with your provider, or enter a new e-Visit to address those concerns.   You will get an e-mail in the next two  days asking about your experience.  I hope that your e-visit has been valuable and will speed your recovery. Thank you for using e-visits.   Greater than 5 but less than 10 minutes spent researching, coordinating, and implementing care for this patient today

## 2019-08-07 DIAGNOSIS — Z03818 Encounter for observation for suspected exposure to other biological agents ruled out: Secondary | ICD-10-CM | POA: Diagnosis not present

## 2019-08-22 ENCOUNTER — Telehealth: Payer: Medicaid Other

## 2019-08-22 ENCOUNTER — Other Ambulatory Visit: Payer: Self-pay | Admitting: *Deleted

## 2019-08-22 MED ORDER — METRONIDAZOLE 500 MG PO TABS: 500.0000 mg | ORAL_TABLET | Freq: Two times a day (BID) | ORAL | 0 refills | Status: DC

## 2019-09-07 ENCOUNTER — Ambulatory Visit (INDEPENDENT_AMBULATORY_CARE_PROVIDER_SITE_OTHER)
Admission: RE | Admit: 2019-09-07 | Discharge: 2019-09-07 | Disposition: A | Discharge status: Home / Self Care | Payer: Medicaid Other | Source: Ambulatory Visit

## 2019-09-07 ENCOUNTER — Other Ambulatory Visit: Payer: Self-pay

## 2019-09-07 DIAGNOSIS — R300 Dysuria: Secondary | ICD-10-CM

## 2019-09-07 MED ORDER — SULFAMETHOXAZOLE-TRIMETHOPRIM 800-160 MG PO TABS: 1.0000 | ORAL_TABLET | Freq: Two times a day (BID) | ORAL | 0 refills | Status: AC

## 2019-09-07 NOTE — ED Provider Notes (Signed)
Virtual Visit via Video Note:  Taylor Bowen  initiated request for Telemedicine visit with [facility] Urgent [facility] team. I connected with Taylor Bowen  on 09/07/2019 at 12:15 PM  for a synchronized telemedicine visit using a video enabled HIPPA compliant telemedicine application. I verified that I am speaking with Taylor Bowen  using two identifiers. Sharion Balloon, NP  was physically located in a [facility] Urgent care site and Taylor Bowen was located at a different location.   The limitations of evaluation and management by telemedicine as well as the availability of in-person appointments were discussed. Patient was informed that she  may incur a bill ( including co-pay) for this virtual visit encounter. Taylor Bowen  expressed understanding and gave verbal consent to proceed with virtual visit.     History of Present Illness:Taylor Bowen  is a 34 y.o. female presents for evaluation of dysuria, frequency, urgency x 6 days.  She states this feels like her previous UTIs.  Attempted treatment at home with increased water intake.  Denies fever, chills, abdominal pain, back pain, vaginal discharge, pelvic pain, lesions, rash, or other symptoms.  She denies pregnancy or breastfeeding.     No Known Allergies   Past Medical History:  Diagnosis Date  . Anemia   . Constipation    Resolved per patient 08/01/17  . Genital warts    acid treatment in 2007  . GERD (gastroesophageal reflux disease)    occasional  . History of blood transfusion 05/25/2017   [place]  . Urinary tract infection      Social History   Tobacco Use  . Smoking status: Former Smoker    Packs/day: 1.00    Years: 3.00    Pack years: 3.00    Types: Cigarettes    Quit date: 02/07/2011    Years since quitting: 8.5  . Smokeless tobacco: Never Used  Substance Use Topics  . Alcohol use: Yes    Comment: occasional  . Drug use: No    ROS: as stated in HPI.  All other systems reviewed and negative.        Observations/Objective: Physical Exam  VITALS: Patient denies fever. GENERAL: Alert, appears well and in no acute distress. HEENT: Atraumatic. NECK: Normal movements of the head and neck. CARDIOPULMONARY: No increased WOB. Speaking in clear sentences. I:E ratio WNL.  MS: Moves all visible extremities without noticeable abnormality. PSYCH: Pleasant and cooperative, well-groomed. Speech normal rate and rhythm. Affect is appropriate. Insight and judgement are appropriate. Attention is focused, linear, and appropriate.  NEURO: CN grossly intact. Oriented as arrived to appointment on time with no prompting. Moves both UE equally.  SKIN: No obvious lesions, wounds, erythema, or cyanosis noted on face or hands.   Assessment and Plan:    ICD-10-CM   1. Dysuria  R30.0        Follow Up Instructions: Treating with Bactrim, which is indicated by previous C&S.  Instructed patient to come here to be seen in person or follow-up with her PCP if her symptoms or not improving.  Patient agrees to plan of care.      I discussed the assessment and treatment plan with the patient. The patient was provided an opportunity to ask questions and all were answered. The patient agreed with the plan and demonstrated an understanding of the instructions.   The patient was advised to call back or seek an in-person evaluation if the symptoms worsen or if the condition fails  to improve as anticipated.      Sharion Balloon, NP  09/07/2019 12:15 PM         Sharion Balloon, NP 09/07/19 1217

## 2019-09-07 NOTE — Discharge Instructions (Addendum)
Take the antibiotic as directed.   Come here to be seen in person or follow up with your primary care provider if your symptoms are not improving.

## 2019-09-10 DIAGNOSIS — Z03818 Encounter for observation for suspected exposure to other biological agents ruled out: Secondary | ICD-10-CM | POA: Diagnosis not present

## 2019-09-26 DIAGNOSIS — Z1388 Encounter for screening for disorder due to exposure to contaminants: Secondary | ICD-10-CM | POA: Diagnosis not present

## 2019-09-26 DIAGNOSIS — B373 Candidiasis of vulva and vagina: Secondary | ICD-10-CM | POA: Diagnosis not present

## 2019-09-26 DIAGNOSIS — Z114 Encounter for screening for human immunodeficiency virus [HIV]: Secondary | ICD-10-CM | POA: Diagnosis not present

## 2019-09-26 DIAGNOSIS — Z3009 Encounter for other general counseling and advice on contraception: Secondary | ICD-10-CM | POA: Diagnosis not present

## 2019-09-26 DIAGNOSIS — Z113 Encounter for screening for infections with a predominantly sexual mode of transmission: Secondary | ICD-10-CM | POA: Diagnosis not present

## 2019-09-26 DIAGNOSIS — Z0389 Encounter for observation for other suspected diseases and conditions ruled out: Secondary | ICD-10-CM | POA: Diagnosis not present

## 2019-10-14 DIAGNOSIS — N760 Acute vaginitis: Secondary | ICD-10-CM | POA: Diagnosis not present

## 2019-11-18 ENCOUNTER — Ambulatory Visit (INDEPENDENT_AMBULATORY_CARE_PROVIDER_SITE_OTHER)
Admission: RE | Admit: 2019-11-18 | Discharge: 2019-11-18 | Disposition: A | Discharge status: Home / Self Care | Payer: Medicaid Other | Source: Ambulatory Visit

## 2019-11-18 DIAGNOSIS — N898 Other specified noninflammatory disorders of vagina: Secondary | ICD-10-CM

## 2019-11-18 DIAGNOSIS — N760 Acute vaginitis: Secondary | ICD-10-CM | POA: Diagnosis not present

## 2019-11-18 MED ORDER — FLUCONAZOLE 150 MG PO TABS: 150.0000 mg | ORAL_TABLET | ORAL | 0 refills | Status: DC

## 2019-11-18 NOTE — ED Provider Notes (Signed)
Virtual Visit via Video Note:  Taylor Bowen  initiated request for Telemedicine visit with [facility] team. I connected with Taylor Bowen  on 11/18/2019 at 1:02 PM  for a synchronized telemedicine visit using a video enabled HIPPA compliant telemedicine application. I verified that I am speaking with Taylor Bowen  using two identifiers. Taylor Eagles, PA-C  was physically located in a [facility] Urgent care site and Taylor Bowen was located at a different location.   The limitations of evaluation and management by telemedicine as well as the availability of in-person appointments were discussed. Patient was informed that she  may incur a bill ( including co-pay) for this virtual visit encounter. Taylor Bowen  expressed understanding and gave verbal consent to proceed with virtual visit.   History of Present Illness:Taylor Bowen  is a 34 y.o. female presents with 3 day hx of acute onset recurrent thick vaginal discharge, vaginal itching/irritation. Has tried Monistat without relief. Uses probiotics regularly. Is sexually active, does not use condoms for relief. Does not have cycles, uses Nexplanon. Has a hx of yeast and BV infections.   ROS  No current facility-administered medications for this encounter.   Current Outpatient Medications  Medication Sig Dispense Refill  . Cetirizine HCl 10 MG CAPS Take 1 capsule (10 mg total) by mouth daily for 10 days. 10 capsule 0  . etonogestrel (NEXPLANON) 68 MG IMPL implant 1 each by Subdermal route once.    . fluticasone (FLONASE) 50 MCG/ACT nasal spray Place 1-2 sprays into both nostrils daily for 7 days. 1 g 0  . ibuprofen (ADVIL,MOTRIN) 600 MG tablet Take 1 tablet (600 mg total) by mouth every 6 (six) hours as needed. 30 tablet 0  . ibuprofen (ADVIL,MOTRIN) 600 MG tablet Take 1 pill RTC q 8 hours for 2 weeks 90 tablet 1  . metroNIDAZOLE (FLAGYL) 500 MG tablet Take 1 tablet (500 mg total) by mouth 2 (two) times daily. 14  tablet 0  . nitrofurantoin, macrocrystal-monohydrate, (MACROBID) 100 MG capsule Take 1 capsule (100 mg total) by mouth 2 (two) times daily. (Patient not taking: Reported on 06/14/2019) 10 capsule 0     No Known Allergies   Past Medical History:  Diagnosis Date  . Anemia   . Constipation    Resolved per patient 08/01/17  . Genital warts    acid treatment in 2007  . GERD (gastroesophageal reflux disease)    occasional  . History of blood transfusion 05/25/2017   [place]  . Urinary tract infection     Past Surgical History:  Procedure Laterality Date  . HERNIA REPAIR     umbilical  . MYOMECTOMY N/A 08/11/2017   Procedure: MYOMECTOMY;  Surgeon: [name], MD;  Location: [facility];  Service: Gynecology;  Laterality: N/A;  . WISDOM TOOTH EXTRACTION        Observations/Objective: Physical Exam Constitutional:      General: She is not in acute distress.    Appearance: Normal appearance. She is well-developed. She is obese. She is not ill-appearing, toxic-appearing or diaphoretic.  Eyes:     Extraocular Movements: Extraocular movements intact.  Pulmonary:     Effort: Pulmonary effort is normal.  Neurological:     General: No focal deficit present.     Mental Status: She is alert and oriented to person, place, and time.  Psychiatric:        Mood and Affect: Mood normal.  Behavior: Behavior normal.        Thought Content: Thought content normal.        Judgment: Judgment normal.      Assessment and Plan:  PDMP not reviewed this encounter.  1. Acute vaginitis   2. Vaginal discharge    Start Diflucan for suspected yeast vaginitis.  Patient insisted that this is not bacterial vaginosis as she has had this before and is very different for her.  Her history is very consistent with yeast vaginitis per patient.  Recommend in person office visit and consider STI testing especially if she does not get better with Diflucan.  Patient was agreeable to this. Counseled patient on  potential for adverse effects with medications prescribed/recommended today, ER and return-to-clinic precautions discussed, patient verbalized understanding.    Follow Up Instructions:    I discussed the assessment and treatment plan with the patient. The patient was provided an opportunity to ask questions and all were answered. The patient agreed with the plan and demonstrated an understanding of the instructions.   The patient was advised to call back or seek an in-person evaluation if the symptoms worsen or if the condition fails to improve as anticipated.  I provided 8 minutes of non-face-to-face time during this encounter.    Taylor Eagles, PA-C  11/18/2019 1:02 PM         Taylor Eagles, PA-C 11/18/19 1309

## 2019-11-26 ENCOUNTER — Telehealth: Payer: Medicaid Other | Admitting: Physician Assistant

## 2019-11-26 DIAGNOSIS — N898 Other specified noninflammatory disorders of vagina: Secondary | ICD-10-CM

## 2019-11-26 MED ORDER — METRONIDAZOLE 500 MG PO TABS: 500.0000 mg | ORAL_TABLET | Freq: Two times a day (BID) | ORAL | 0 refills | Status: DC

## 2019-11-26 NOTE — Progress Notes (Signed)

## 2019-12-07 DIAGNOSIS — Z13220 Encounter for screening for lipoid disorders: Secondary | ICD-10-CM | POA: Diagnosis not present

## 2019-12-07 DIAGNOSIS — Z130 Encounter for screening for diseases of the blood and blood-forming organs and certain disorders involving the immune mechanism: Secondary | ICD-10-CM | POA: Diagnosis not present

## 2019-12-07 DIAGNOSIS — Z0189 Encounter for other specified special examinations: Secondary | ICD-10-CM | POA: Diagnosis not present

## 2019-12-07 DIAGNOSIS — Z131 Encounter for screening for diabetes mellitus: Secondary | ICD-10-CM | POA: Diagnosis not present

## 2019-12-11 DIAGNOSIS — N760 Acute vaginitis: Secondary | ICD-10-CM | POA: Diagnosis not present

## 2019-12-15 DIAGNOSIS — Z20822 Contact with and (suspected) exposure to covid-19: Secondary | ICD-10-CM | POA: Diagnosis not present

## 2019-12-17 ENCOUNTER — Ambulatory Visit (INDEPENDENT_AMBULATORY_CARE_PROVIDER_SITE_OTHER)
Admission: RE | Admit: 2019-12-17 | Discharge: 2019-12-17 | Disposition: A | Discharge status: Home / Self Care | Payer: Medicaid Other | Source: Ambulatory Visit

## 2019-12-17 DIAGNOSIS — S93401A Sprain of unspecified ligament of right ankle, initial encounter: Secondary | ICD-10-CM | POA: Diagnosis not present

## 2019-12-17 MED ORDER — IBUPROFEN 600 MG PO TABS: 600.0000 mg | ORAL_TABLET | Freq: Three times a day (TID) | ORAL | 0 refills | Status: DC | PRN

## 2019-12-17 NOTE — ED Provider Notes (Signed)
Virtual Visit via Video Note:  Taylor Bowen  initiated request for Telemedicine visit with Taylor Bowen team. I connected with Taylor Bowen  on 12/17/2019 at 12:52 PM  for a synchronized telemedicine visit using a video enabled HIPPA compliant telemedicine application. I verified that I am speaking with Taylor Bowen  using two identifiers. Orvan July, NP  was physically located in a [facility] site and Taylor Bowen was located at a different location.   The limitations of evaluation and management by telemedicine as well as the availability of in-person appointments were discussed. Patient was informed that she  may incur a bill ( including co-pay) for this virtual visit encounter. Taylor Bowen  expressed understanding and gave verbal consent to proceed with virtual visit.     History of Present Illness:Taylor Bowen  is a 34 y.o. female presents with right ankle pain and swelling for the last week after working out.  The pain is located more laterally.  The pain has improved.  Occurred after doing squat jumps. sts initially bruised and red but that has resolved. She has been soaking it and icing it, resting it.  She has not take anything by mouth for pain.  Denies any numbness, tingling or loss of sensation.  She is able to ambulate but with mild pain.    Past Medical History:  Diagnosis Date   Anemia    Constipation    Resolved per patient 08/01/17   Genital warts    acid treatment in 2007   GERD (gastroesophageal reflux disease)    occasional   History of blood transfusion 05/25/2017   [facility]   Urinary tract infection     No Known Allergies      Observations/Objective:VITALS: Per patient if applicable, see vitals. GENERAL: Alert, appears well and in no acute distress. HEENT: Atraumatic, conjunctiva clear, no obvious abnormalities on inspection of external nose and ears. NECK: Normal movements of the head and neck. CARDIOPULMONARY: No  increased WOB. Speaking in clear sentences. I:E ratio WNL.  MS: Good range of motion.  Mild swelling noted to lateral malleolus.  PSYCH: Pleasant and cooperative, well-groomed. Speech normal rate and rhythm. Affect is appropriate. Insight and judgement are appropriate. Attention is focused, linear, and appropriate.  NEURO: CN grossly intact. Oriented as arrived to appointment on time with no prompting. Moves both UE equally.  SKIN: No obvious lesions, wounds, erythema, or cyanosis noted on face or hands.    Assessment and Plan: Most likely ankle sprain.  Her symptoms have improved with her treatment she is been doing at home.  Recommend she can add in ibuprofen 600 mg every 8 hours to see if this helps better with pain, inflammation and swelling.  Continue to rest, ice, elevate.  Ace wrap for compression   Follow Up Instructions: Follow-up in person for x-ray for any continued or worsening problems    I discussed the assessment and treatment plan with the patient. The patient was provided an opportunity to ask questions and all were answered. The patient agreed with the plan and demonstrated an understanding of the instructions.   The patient was advised to call back or seek an in-person evaluation if the symptoms worsen or if the condition fails to improve as anticipated.     Orvan July, NP  12/17/2019 12:52 PM         Orvan July, NP 12/17/19 1538

## 2019-12-17 NOTE — Discharge Instructions (Addendum)
You most likely have about ankle sprain. Keep resting, icing elevate ankle. Ibuprofen every 8 hours for pain and inflammation. Ace wrapped Follow up as needed for continued or worsening symptoms

## 2020-01-23 ENCOUNTER — Telehealth: Payer: Medicaid Other | Admitting: Nurse Practitioner

## 2020-01-23 DIAGNOSIS — N760 Acute vaginitis: Secondary | ICD-10-CM | POA: Diagnosis not present

## 2020-01-23 DIAGNOSIS — B9689 Other specified bacterial agents as the cause of diseases classified elsewhere: Secondary | ICD-10-CM

## 2020-01-23 MED ORDER — METRONIDAZOLE 500 MG PO TABS: 500.0000 mg | ORAL_TABLET | Freq: Two times a day (BID) | ORAL | 0 refills | Status: DC

## 2020-01-23 NOTE — Progress Notes (Signed)

## 2020-05-06 ENCOUNTER — Telehealth: Payer: Medicaid Other | Admitting: Family

## 2020-05-06 DIAGNOSIS — R399 Unspecified symptoms and signs involving the genitourinary system: Secondary | ICD-10-CM

## 2020-05-06 MED ORDER — CEPHALEXIN 500 MG PO CAPS: 500.0000 mg | ORAL_CAPSULE | Freq: Two times a day (BID) | ORAL | 0 refills | Status: DC

## 2020-05-06 NOTE — Progress Notes (Signed)

## 2020-05-12 ENCOUNTER — Telehealth: Payer: Medicaid Other | Admitting: Emergency Medicine

## 2020-05-12 DIAGNOSIS — N760 Acute vaginitis: Secondary | ICD-10-CM

## 2020-05-12 MED ORDER — FLUCONAZOLE 150 MG PO TABS: 150.0000 mg | ORAL_TABLET | Freq: Once | ORAL | 0 refills | Status: AC

## 2020-05-12 NOTE — Progress Notes (Signed)
We are sorry that you are not feeling well. Here is how we plan to help! Based on what you shared with me it looks like you: May have a yeast vaginosis, likely from your recent antibiotic use.  Vaginosis is an inflammation of the vagina that can result in discharge, itching and pain. The cause is usually a change in the normal balance of vaginal bacteria or an infection. Vaginosis can also result from reduced estrogen levels after menopause.  The most common causes of vaginosis are:   Bacterial vaginosis which results from an overgrowth of one on several organisms that are normally present in your vagina.   Yeast infections which are caused by a naturally occurring fungus called candida.   Vaginal atrophy (atrophic vaginosis) which results from the thinning of the vagina from reduced estrogen levels after menopause.   Trichomoniasis which is caused by a parasite and is commonly transmitted by sexual intercourse.  Factors that increase your risk of developing vaginosis include: Marland Kitchen Medications, such as antibiotics and steroids . Uncontrolled diabetes . Use of hygiene products such as bubble bath, vaginal spray or vaginal deodorant . Douching . Wearing damp or tight-fitting clothing . Using an intrauterine device (IUD) for birth control . Hormonal changes, such as those associated with pregnancy, birth control pills or menopause . Sexual activity . Having a sexually transmitted infection  Your treatment plan is A single Diflucan (fluconazole) 150mg  tablet once.  I have electronically sent this prescription into the pharmacy that you have chosen.  Be sure to take all of the medication as directed. Stop taking any medication if you develop a rash, tongue swelling or shortness of breath. Mothers who are breast feeding should consider pumping and discarding their breast milk while on these antibiotics. However, there is no consensus that infant exposure at these doses would be harmful.  Remember  that medication creams can weaken latex condoms. Marland Kitchen   HOME CARE:  Good hygiene may prevent some types of vaginosis from recurring and may relieve some symptoms:  . Avoid baths, hot tubs and whirlpool spas. Rinse soap from your outer genital area after a shower, and dry the area well to prevent irritation. Don't use scented or harsh soaps, such as those with deodorant or antibacterial action. Marland Kitchen Avoid irritants. These include scented tampons and pads. . Wipe from front to back after using the toilet. Doing so avoids spreading fecal bacteria to your vagina.  Other things that may help prevent vaginosis include:  Marland Kitchen Don't douche. Your vagina doesn't require cleansing other than normal bathing. Repetitive douching disrupts the normal organisms that reside in the vagina and can actually increase your risk of vaginal infection. Douching won't clear up a vaginal infection. . Use a latex condom. Both female and female latex condoms may help you avoid infections spread by sexual contact. . Wear cotton underwear. Also wear pantyhose with a cotton crotch. If you feel comfortable without it, skip wearing underwear to bed. Yeast thrives in [employer] Your symptoms should improve in the next day or two.  GET HELP RIGHT AWAY IF:  . You have pain in your lower abdomen ( pelvic area or over your ovaries) . You develop nausea or vomiting . You develop a fever . Your discharge changes or worsens . You have persistent pain with intercourse . You develop shortness of breath, a rapid pulse, or you faint.  These symptoms could be signs of problems or infections that need to be evaluated by a medical provider now.  MAKE SURE YOU    Understand these instructions.  Will watch your condition.  Will get help right away if you are not doing well or get worse.  Your e-visit answers were reviewed by a board certified advanced clinical practitioner to complete your personal care plan. Depending upon the  condition, your plan could have included both over the counter or prescription medications. Please review your pharmacy choice to make sure that you have choses a pharmacy that is open for you to pick up any needed prescription, Your safety is important to [place]. If you have drug allergies check your prescription carefully.   You can use MyChart to ask questions about today's visit, request a non-urgent call back, or ask for a work or school excuse for 24 hours related to this e-Visit. If it has been greater than 24 hours you will need to follow up with your provider, or enter a new e-Visit to address those concerns. You will get a MyChart message within the next two days asking about your experience. I hope that your e-visit has been valuable and will speed your recovery.  Approximately 5 minutes was used in reviewing the patient's chart, questionnaire, prescribing medications, and documentation.

## 2020-05-22 ENCOUNTER — Telehealth: Payer: Medicaid Other | Admitting: Emergency Medicine

## 2020-05-22 DIAGNOSIS — N898 Other specified noninflammatory disorders of vagina: Secondary | ICD-10-CM | POA: Diagnosis not present

## 2020-05-22 MED ORDER — METRONIDAZOLE 500 MG PO TABS: 500.0000 mg | ORAL_TABLET | Freq: Three times a day (TID) | ORAL | 0 refills | Status: DC

## 2020-05-22 NOTE — Progress Notes (Signed)
Time spent: 10 min  We are sorry that you are not feeling well. Here is how we plan to help!  Based on what you shared with me it looks like you: May have a vaginosis due to bacteria  Vaginosis is an inflammation of the vagina that can result in discharge, itching and pain. The cause is usually a change in the normal balance of vaginal bacteria or an infection. Vaginosis can also result from reduced estrogen levels after menopause.  The most common causes of vaginosis are:   Bacterial vaginosis which results from an overgrowth of one on several organisms that are normally present in your vagina.   Yeast infections which are caused by a naturally occurring fungus called candida.   Vaginal atrophy (atrophic vaginosis) which results from the thinning of the vagina from reduced estrogen levels after menopause.   Trichomoniasis which is caused by a parasite and is commonly transmitted by sexual intercourse.  Factors that increase your risk of developing vaginosis include: Marland Kitchen Medications, such as antibiotics and steroids . Uncontrolled diabetes . Use of hygiene products such as bubble bath, vaginal spray or vaginal deodorant . Douching . Wearing damp or tight-fitting clothing . Using an intrauterine device (IUD) for birth control . Hormonal changes, such as those associated with pregnancy, birth control pills or menopause . Sexual activity . Having a sexually transmitted infection  Your treatment plan is Metronidazole or Flagyl 500mg  twice a day for 7 days.  I have electronically sent this prescription into the pharmacy that you have chosen.  Be sure to take all of the medication as directed. Stop taking any medication if you develop a rash, tongue swelling or shortness of breath. Mothers who are breast feeding should consider pumping and discarding their breast milk while on these antibiotics. However, there is no consensus that infant exposure at these doses would be harmful.  Remember  that medication creams can weaken latex condoms. Marland Kitchen   HOME CARE:  Good hygiene may prevent some types of vaginosis from recurring and may relieve some symptoms:  . Avoid baths, hot tubs and whirlpool spas. Rinse soap from your outer genital area after a shower, and dry the area well to prevent irritation. Don't use scented or harsh soaps, such as those with deodorant or antibacterial action. Marland Kitchen Avoid irritants. These include scented tampons and pads. . Wipe from front to back after using the toilet. Doing so avoids spreading fecal bacteria to your vagina.  Other things that may help prevent vaginosis include:  Marland Kitchen Don't douche. Your vagina doesn't require cleansing other than normal bathing. Repetitive douching disrupts the normal organisms that reside in the vagina and can actually increase your risk of vaginal infection. Douching won't clear up a vaginal infection. . Use a latex condom. Both female and female latex condoms may help you avoid infections spread by sexual contact. . Wear cotton underwear. Also wear pantyhose with a cotton crotch. If you feel comfortable without it, skip wearing underwear to bed. Yeast thrives in [employer] Your symptoms should improve in the next day or two.  GET HELP RIGHT AWAY IF:  . You have pain in your lower abdomen ( pelvic area or over your ovaries) . You develop nausea or vomiting . You develop a fever . Your discharge changes or worsens . You have persistent pain with intercourse . You develop shortness of breath, a rapid pulse, or you faint.  These symptoms could be signs of problems or infections that need to be evaluated  by a medical provider now.  MAKE SURE YOU    Understand these instructions.  Will watch your condition.  Will get help right away if you are not doing well or get worse.  Your e-visit answers were reviewed by a board certified advanced clinical practitioner to complete your personal care plan. Depending upon the  condition, your plan could have included both over the counter or prescription medications. Please review your pharmacy choice to make sure that you have choses a pharmacy that is open for you to pick up any needed prescription, Your safety is important to [place]. If you have drug allergies check your prescription carefully.   You can use MyChart to ask questions about today's visit, request a non-urgent call back, or ask for a work or school excuse for 24 hours related to this e-Visit. If it has been greater than 24 hours you will need to follow up with your provider, or enter a new e-Visit to address those concerns. You will get a MyChart message within the next two days asking about your experience. I hope that your e-visit has been valuable and will speed your recovery.

## 2020-06-18 ENCOUNTER — Telehealth: Payer: Medicaid Other | Admitting: Nurse Practitioner

## 2020-06-18 DIAGNOSIS — N390 Urinary tract infection, site not specified: Secondary | ICD-10-CM

## 2020-06-18 MED ORDER — CEPHALEXIN 500 MG PO CAPS: 500.0000 mg | ORAL_CAPSULE | Freq: Two times a day (BID) | ORAL | 0 refills | Status: DC

## 2020-06-18 NOTE — Progress Notes (Signed)
We are sorry that you are not feeling well.  Here is how we plan to help!  Based on what you shared with me it looks like you most likely have a simple urinary tract infection.  A UTI (Urinary Tract Infection) is a bacterial infection of the bladder.  Most cases of urinary tract infections are simple to treat but a key part of your care is to encourage you to drink plenty of fluids and watch your symptoms carefully. This is the second uti in a  Month and a half. If you get anoither one in the next few weeks you will need a face to face visit.  I have prescribed Keflex 500 mg twice a day for 7 days.  Your symptoms should gradually improve. Call us if the burning in your urine worsens, you develop worsening fever, back pain or pelvic pain or if your symptoms do not resolve after completing the antibiotic.  Urinary tract infections can be prevented by drinking plenty of water to keep your body hydrated.  Also be sure when you wipe, wipe from front to back and don't hold it in!  If possible, empty your bladder every 4 hours.  Your e-visit answers were reviewed by a board certified advanced clinical practitioner to complete your personal care plan.  Depending on the condition, your plan could have included both over the counter or prescription medications.  If there is a problem please reply  once you have received a response from your provider.  Your safety is important to [place].  If you have drug allergies check your prescription carefully.    You can use MyChart to ask questions about today's visit, request a non-urgent call back, or ask for a work or school excuse for 24 hours related to this e-Visit. If it has been greater than 24 hours you will need to follow up with your provider, or enter a new e-Visit to address those concerns.   You will get an e-mail in the next two days asking about your experience.  I hope that your e-visit has been valuable and will speed your recovery. Thank you for  using e-visits.  5-10 minutes spent reviewing and documenting in chart.

## 2020-07-30 ENCOUNTER — Telehealth: Payer: Medicaid Other | Admitting: Family

## 2020-07-30 DIAGNOSIS — B373 Candidiasis of vulva and vagina: Secondary | ICD-10-CM | POA: Diagnosis not present

## 2020-07-30 DIAGNOSIS — B3731 Acute candidiasis of vulva and vagina: Secondary | ICD-10-CM

## 2020-07-30 MED ORDER — FLUCONAZOLE 150 MG PO TABS: 150.0000 mg | ORAL_TABLET | ORAL | 0 refills | Status: DC | PRN

## 2020-07-30 NOTE — Progress Notes (Signed)
We are sorry that you are not feeling well. Here is how we plan to help! Based on what you shared with me it looks like you: May have a yeast vaginosis  Vaginosis is an inflammation of the vagina that can result in discharge, itching and pain. The cause is usually a change in the normal balance of vaginal bacteria or an infection. Vaginosis can also result from reduced estrogen levels after menopause.  The most common causes of vaginosis are:   Bacterial vaginosis which results from an overgrowth of one on several organisms that are normally present in your vagina.   Yeast infections which are caused by a naturally occurring fungus called candida.   Vaginal atrophy (atrophic vaginosis) which results from the thinning of the vagina from reduced estrogen levels after menopause.   Trichomoniasis which is caused by a parasite and is commonly transmitted by sexual intercourse.  Factors that increase your risk of developing vaginosis include: Marland Kitchen Medications, such as antibiotics and steroids . Uncontrolled diabetes . Use of hygiene products such as bubble bath, vaginal spray or vaginal deodorant . Douching . Wearing damp or tight-fitting clothing . Using an intrauterine device (IUD) for birth control . Hormonal changes, such as those associated with pregnancy, birth control pills or menopause . Sexual activity . Having a sexually transmitted infection  Your treatment plan is A single Diflucan (fluconazole) 150mg  tablet once.  I have electronically sent this prescription into the pharmacy that you have chosen.   *If your symptoms return or do not improve you will need to be seen face to face for further testing.   Be sure to take all of the medication as directed. Stop taking any medication if you develop a rash, tongue swelling or shortness of breath. Mothers who are breast feeding should consider pumping and discarding their breast milk while on these antibiotics. However, there is no  consensus that infant exposure at these doses would be harmful.  Remember that medication creams can weaken latex condoms.   HOME CARE:  Good hygiene may prevent some types of vaginosis from recurring and may relieve some symptoms:  . Avoid baths, hot tubs and whirlpool spas. Rinse soap from your outer genital area after a shower, and dry the area well to prevent irritation. Don't use scented or harsh soaps, such as those with deodorant or antibacterial action. Marland Kitchen Avoid irritants. These include scented tampons and pads. . Wipe from front to back after using the toilet. Doing so avoids spreading fecal bacteria to your vagina.  Other things that may help prevent vaginosis include:  Marland Kitchen Don't douche. Your vagina doesn't require cleansing other than normal bathing. Repetitive douching disrupts the normal organisms that reside in the vagina and can actually increase your risk of vaginal infection. Douching won't clear up a vaginal infection. . Use a latex condom. Both female and female latex condoms may help you avoid infections spread by sexual contact. . Wear cotton underwear. Also wear pantyhose with a cotton crotch. If you feel comfortable without it, skip wearing underwear to bed. Yeast thrives in Marland Kitchen Your symptoms should improve in the next day or two.  GET HELP RIGHT AWAY IF:  . You have pain in your lower abdomen ( pelvic area or over your ovaries) . You develop nausea or vomiting . You develop a fever . Your discharge changes or worsens . You have persistent pain with intercourse . You develop shortness of breath, a rapid pulse, or you faint.  These symptoms  could be signs of problems or infections that need to be evaluated by a medical provider now.  MAKE SURE YOU    Understand these instructions.  Will watch your condition.  Will get help right away if you are not doing well or get worse.  Your e-visit answers were reviewed by a board certified advanced  clinical practitioner to complete your personal care plan. Depending upon the condition, your plan could have included both over the counter or prescription medications. Please review your pharmacy choice to make sure that you have choses a pharmacy that is open for you to pick up any needed prescription, Your safety is important to [place]. If you have drug allergies check your prescription carefully.   You can use MyChart to ask questions about today's visit, request a non-urgent call back, or ask for a work or school excuse for 24 hours related to this e-Visit. If it has been greater than 24 hours you will need to follow up with your provider, or enter a new e-Visit to address those concerns. You will get a MyChart message within the next two days asking about your experience. I hope that your e-visit has been valuable and will speed your recovery.   Approximately 5 minutes was spent documenting and reviewing patient's chart.

## 2020-08-11 ENCOUNTER — Telehealth: Payer: Medicaid Other | Admitting: Physician Assistant

## 2020-08-11 DIAGNOSIS — N760 Acute vaginitis: Secondary | ICD-10-CM

## 2020-08-11 MED ORDER — CLINDAMYCIN PHOSPHATE 2 % VA CREA: 1.0000 | TOPICAL_CREAM | Freq: Every day | VAGINAL | 0 refills | Status: DC

## 2020-08-11 NOTE — Progress Notes (Signed)
Hi Chezney,   I am sorry you aren't feeling well.  I can see that you were seen at least 8 times in 2021 for either vaginosis or UTI symptoms.   I will treat you today, but please schedule a follow-up visit with your PCP or GYN for this problem.  You should receive testing so that we know we are treating you appropriately.    Based on what you shared with me it looks like you: May have a vaginosis due to bacteria  Vaginosis is an inflammation of the vagina that can result in discharge, itching and pain. The cause is usually a change in the normal balance of vaginal bacteria or an infection. Vaginosis can also result from reduced estrogen levels after menopause.  The most common causes of vaginosis are:   Bacterial vaginosis which results from an overgrowth of one on several organisms that are normally present in your vagina.   Yeast infections which are caused by a naturally occurring fungus called candida.   Vaginal atrophy (atrophic vaginosis) which results from the thinning of the vagina from reduced estrogen levels after menopause.   Trichomoniasis which is caused by a parasite and is commonly transmitted by sexual intercourse.  Factors that increase your risk of developing vaginosis include: Marland Kitchen Medications, such as antibiotics and steroids . Uncontrolled diabetes . Use of hygiene products such as bubble bath, vaginal spray or vaginal deodorant . Douching . Wearing damp or tight-fitting clothing . Using an intrauterine device (IUD) for birth control . Hormonal changes, such as those associated with pregnancy, birth control pills or menopause . Sexual activity . Having a sexually transmitted infection  Your treatment plan is Clindamycin vaginal cream 5 grams applied vaginally for 7 days.  I have electronically sent this prescription into the pharmacy that you have chosen.  Be sure to take all of the medication as directed. Stop taking any medication if you develop a rash, tongue  swelling or shortness of breath. Mothers who are breast feeding should consider pumping and discarding their breast milk while on these antibiotics. However, there is no consensus that infant exposure at these doses would be harmful.  Remember that medication creams can weaken latex condoms. Marland Kitchen   HOME CARE:  Good hygiene may prevent some types of vaginosis from recurring and may relieve some symptoms:  . Avoid baths, hot tubs and whirlpool spas. Rinse soap from your outer genital area after a shower, and dry the area well to prevent irritation. Don't use scented or harsh soaps, such as those with deodorant or antibacterial action. Marland Kitchen Avoid irritants. These include scented tampons and pads. . Wipe from front to back after using the toilet. Doing so avoids spreading fecal bacteria to your vagina.  Other things that may help prevent vaginosis include:  Marland Kitchen Don't douche. Your vagina doesn't require cleansing other than normal bathing. Repetitive douching disrupts the normal organisms that reside in the vagina and can actually increase your risk of vaginal infection. Douching won't clear up a vaginal infection. . Use a latex condom. Both female and female latex condoms may help you avoid infections spread by sexual contact. . Wear cotton underwear. Also wear pantyhose with a cotton crotch. If you feel comfortable without it, skip wearing underwear to bed. Yeast thrives in [employer] Your symptoms should improve in the next day or two.  GET HELP RIGHT AWAY IF:  . You have pain in your lower abdomen ( pelvic area or over your ovaries) . You develop nausea  or vomiting . You develop a fever . Your discharge changes or worsens . You have persistent pain with intercourse . You develop shortness of breath, a rapid pulse, or you faint.  These symptoms could be signs of problems or infections that need to be evaluated by a medical provider now.  MAKE SURE YOU    Understand these  instructions.  Will watch your condition.  Will get help right away if you are not doing well or get worse.  Your e-visit answers were reviewed by a board certified advanced clinical practitioner to complete your personal care plan. Depending upon the condition, your plan could have included both over the counter or prescription medications. Please review your pharmacy choice to make sure that you have choses a pharmacy that is open for you to pick up any needed prescription, Your safety is important to [place]. If you have drug allergies check your prescription carefully.   You can use MyChart to ask questions about today's visit, request a non-urgent call back, or ask for a work or school excuse for 24 hours related to this e-Visit. If it has been greater than 24 hours you will need to follow up with your provider, or enter a new e-Visit to address those concerns. You will get a MyChart message within the next two days asking about your experience. I hope that your e-visit has been valuable and will speed your recovery.  Greater than 5 minutes, yet less than 10 minutes of time have been spent researching, coordinating and implementing care for this patient today.

## 2020-08-12 ENCOUNTER — Telehealth: Payer: Medicaid Other | Admitting: Nurse Practitioner

## 2020-08-12 DIAGNOSIS — N760 Acute vaginitis: Secondary | ICD-10-CM

## 2020-08-12 DIAGNOSIS — B9689 Other specified bacterial agents as the cause of diseases classified elsewhere: Secondary | ICD-10-CM

## 2020-08-12 MED ORDER — METRONIDAZOLE 500 MG PO TABS: 500.0000 mg | ORAL_TABLET | Freq: Two times a day (BID) | ORAL | 0 refills | Status: DC

## 2020-08-12 NOTE — Progress Notes (Signed)
We are sorry that you are not feeling well. Here is how we plan to help! Based on what you shared with me it looks like you: May have a vaginosis due to bacteria  Vaginosis is an inflammation of the vagina that can result in discharge, itching and pain. The cause is usually a change in the normal balance of vaginal bacteria or an infection. Vaginosis can also result from reduced estrogen levels after menopause.  The most common causes of vaginosis are:   Bacterial vaginosis which results from an overgrowth of one on several organisms that are normally present in your vagina.   Yeast infections which are caused by a naturally occurring fungus called candida.   Vaginal atrophy (atrophic vaginosis) which results from the thinning of the vagina from reduced estrogen levels after menopause.   Trichomoniasis which is caused by a parasite and is commonly transmitted by sexual intercourse.  Factors that increase your risk of developing vaginosis include: Marland Kitchen Medications, such as antibiotics and steroids . Uncontrolled diabetes . Use of hygiene products such as bubble bath, vaginal spray or vaginal deodorant . Douching . Wearing damp or tight-fitting clothing . Using an intrauterine device (IUD) for birth control . Hormonal changes, such as those associated with pregnancy, birth control pills or menopause . Sexual activity . Having a sexually transmitted infection  Your treatment plan is Metronidazole or Flagyl 500mg  twice a day for 7 days.  I have electronically sent this prescription into the pharmacy that you have chosen. This was prescribed yesterday.  Be sure to take all of the medication as directed. Stop taking any medication if you develop a rash, tongue swelling or shortness of breath. Mothers who are breast feeding should consider pumping and discarding their breast milk while on these antibiotics. However, there is no consensus that infant exposure at these doses would be harmful.   Remember that medication creams can weaken latex condoms. Marland Kitchen   HOME CARE:  Good hygiene may prevent some types of vaginosis from recurring and may relieve some symptoms:  . Avoid baths, hot tubs and whirlpool spas. Rinse soap from your outer genital area after a shower, and dry the area well to prevent irritation. Don't use scented or harsh soaps, such as those with deodorant or antibacterial action. Marland Kitchen Avoid irritants. These include scented tampons and pads. . Wipe from front to back after using the toilet. Doing so avoids spreading fecal bacteria to your vagina.  Other things that may help prevent vaginosis include:  Marland Kitchen Don't douche. Your vagina doesn't require cleansing other than normal bathing. Repetitive douching disrupts the normal organisms that reside in the vagina and can actually increase your risk of vaginal infection. Douching won't clear up a vaginal infection. . Use a latex condom. Both female and female latex condoms may help you avoid infections spread by sexual contact. . Wear cotton underwear. Also wear pantyhose with a cotton crotch. If you feel comfortable without it, skip wearing underwear to bed. Yeast thrives in [employer] Your symptoms should improve in the next day or two.  GET HELP RIGHT AWAY IF:  . You have pain in your lower abdomen ( pelvic area or over your ovaries) . You develop nausea or vomiting . You develop a fever . Your discharge changes or worsens . You have persistent pain with intercourse . You develop shortness of breath, a rapid pulse, or you faint.  These symptoms could be signs of problems or infections that need to be evaluated by a  medical provider now.  MAKE SURE YOU    Understand these instructions.  Will watch your condition.  Will get help right away if you are not doing well or get worse.  Your e-visit answers were reviewed by a board certified advanced clinical practitioner to complete your personal care plan. Depending  upon the condition, your plan could have included both over the counter or prescription medications. Please review your pharmacy choice to make sure that you have choses a pharmacy that is open for you to pick up any needed prescription, Your safety is important to [place]. If you have drug allergies check your prescription carefully.   You can use MyChart to ask questions about today's visit, request a non-urgent call back, or ask for a work or school excuse for 24 hours related to this e-Visit. If it has been greater than 24 hours you will need to follow up with your provider, or enter a new e-Visit to address those concerns. You will get a MyChart message within the next two days asking about your experience. I hope that your e-visit has been valuable and will speed your recovery.  5-10 minutes spent reviewing and documenting in chart.

## 2020-08-12 NOTE — Addendum Note (Signed)
Addended by: Evelina Dun A on: 08/12/2020 11:50 AM   Modules accepted: Orders

## 2020-09-03 DIAGNOSIS — Z30011 Encounter for initial prescription of contraceptive pills: Secondary | ICD-10-CM | POA: Diagnosis not present

## 2020-09-29 ENCOUNTER — Telehealth: Payer: Medicaid Other | Admitting: Physician Assistant

## 2020-09-29 DIAGNOSIS — R300 Dysuria: Secondary | ICD-10-CM | POA: Diagnosis not present

## 2020-09-29 DIAGNOSIS — N898 Other specified noninflammatory disorders of vagina: Secondary | ICD-10-CM

## 2020-09-29 MED ORDER — CEPHALEXIN 500 MG PO CAPS: 500.0000 mg | ORAL_CAPSULE | Freq: Two times a day (BID) | ORAL | 0 refills | Status: AC

## 2020-09-29 MED ORDER — FLUCONAZOLE 150 MG PO TABS: 150.0000 mg | ORAL_TABLET | Freq: Once | ORAL | 0 refills | Status: AC

## 2020-09-29 NOTE — Progress Notes (Signed)
I have spent 5 minutes in review of e-visit questionnaire, review and updating patient chart, medical decision making and response to patient.   Omarii Scalzo Cody Shaletta Hinostroza, PA-C    

## 2020-09-29 NOTE — Progress Notes (Signed)
We are sorry that you are not feeling well.  Here is how we plan to help!  Based on what you shared with me it looks like you most likely have a simple urinary tract infection.  A UTI (Urinary Tract Infection) is a bacterial infection of the bladder.  Most cases of urinary tract infections are simple to treat but a key part of your care is to encourage you to drink plenty of fluids and watch your symptoms carefully.  I have prescribed MacroBid 100 mg twice a day for 5 days.  Your symptoms should gradually improve. Call us if the burning in your urine worsens, you develop worsening fever, back pain or pelvic pain or if your symptoms do not resolve after completing the antibiotic.  I have also sent in diflucan to take in case of yeast.   Urinary tract infections can be prevented by drinking plenty of water to keep your body hydrated.  Also be sure when you wipe, wipe from front to back and don't hold it in!  If possible, empty your bladder every 4 hours.  Your e-visit answers were reviewed by a board certified advanced clinical practitioner to complete your personal care plan.  Depending on the condition, your plan could have included both over the counter or prescription medications.  If there is a problem please reply  once you have received a response from your provider.  Your safety is important to [place].  If you have drug allergies check your prescription carefully.    You can use MyChart to ask questions about today's visit, request a non-urgent call back, or ask for a work or school excuse for 24 hours related to this e-Visit. If it has been greater than 24 hours you will need to follow up with your provider, or enter a new e-Visit to address those concerns.   You will get an e-mail in the next two days asking about your experience.  I hope that your e-visit has been valuable and will speed your recovery. Thank you for using e-visits.

## 2020-12-16 ENCOUNTER — Encounter (HOSPITAL_COMMUNITY): Payer: Self-pay

## 2020-12-16 ENCOUNTER — Other Ambulatory Visit: Payer: Self-pay

## 2020-12-16 ENCOUNTER — Emergency Department (HOSPITAL_COMMUNITY)
Admission: EM | Admit: 2020-12-16 | Discharge: 2020-12-17 | Disposition: A | Discharge status: Home / Self Care | Payer: Medicaid Other | Attending: Emergency Medicine | Admitting: Emergency Medicine

## 2020-12-16 DIAGNOSIS — M546 Pain in thoracic spine: Secondary | ICD-10-CM | POA: Diagnosis not present

## 2020-12-16 DIAGNOSIS — M79602 Pain in left arm: Secondary | ICD-10-CM

## 2020-12-16 DIAGNOSIS — M542 Cervicalgia: Secondary | ICD-10-CM | POA: Diagnosis not present

## 2020-12-16 DIAGNOSIS — Z87891 Personal history of nicotine dependence: Secondary | ICD-10-CM | POA: Diagnosis not present

## 2020-12-16 DIAGNOSIS — M549 Dorsalgia, unspecified: Secondary | ICD-10-CM

## 2020-12-16 DIAGNOSIS — R200 Anesthesia of skin: Secondary | ICD-10-CM | POA: Insufficient documentation

## 2020-12-16 DIAGNOSIS — R079 Chest pain, unspecified: Secondary | ICD-10-CM | POA: Diagnosis not present

## 2020-12-16 NOTE — ED Provider Notes (Signed)
Emergency Medicine Provider Triage Evaluation Note  Taylor Bowen , a 35 y.o. female  was evaluated in triage.  Pt complains of left sided upper body pain.  She states it started Saturday, she though she slept wrong and had left and right sided neck pain, but the right side got better but left side has spread to involve arm, back, and neck.  Patient hasn't tried anything for her pain. Her pain is better when she is moving during the day.  Review of Systems  Positive: Left arm pain, neck pain, back pain.  Negative: Fever, shortness of breath  Physical Exam  BP 131/74 (BP Location: Right Arm)   Pulse 68   Temp 98.1 F (36.7 C) (Oral)   Resp 18   Ht 5\' 6"  (1.676 m)   Wt 64 kg   LMP 11/22/2020   SpO2 100%   BMI 22.76 kg/m  Gen:   Awake, no distress   Resp:  Normal effort  MSK:   Moves extremities without difficulty  Other:  Able to lift bilateral arms above head.  TTP over left sided upper back.   Medical Decision Making  Medically screening exam initiated at 10:28 PM.  Appropriate orders placed.  Taylor Bowen was informed that the remainder of the evaluation will be completed by another provider, this initial triage assessment does not replace that evaluation, and the importance of remaining in the ED until their evaluation is complete.    Ollen Gross 12/16/20 2232    Milton Ferguson, MD 12/16/20 2252

## 2020-12-16 NOTE — ED Triage Notes (Signed)
Pt c/o L sided neck/shoulder pain x 3 days getting progressively worse. States it is worse at night. Denies injury

## 2020-12-17 MED ORDER — NAPROXEN 500 MG PO TABS
500.0000 mg | ORAL_TABLET | Freq: Once | ORAL | Status: AC
  Administered 2020-12-17: 500 mg via ORAL
  Filled 2020-12-17: qty 1

## 2020-12-17 MED ORDER — ACETAMINOPHEN 325 MG PO TABS
650.0000 mg | ORAL_TABLET | Freq: Once | ORAL | Status: AC
  Administered 2020-12-17: 650 mg via ORAL
  Filled 2020-12-17: qty 2

## 2020-12-17 MED ORDER — NAPROXEN 500 MG PO TABS: 500.0000 mg | ORAL_TABLET | Freq: Two times a day (BID) | ORAL | 0 refills | Status: DC

## 2020-12-17 NOTE — Discharge Instructions (Signed)
Your exam today did not show any signs of a pinched nerve.  Please take naproxen twice a day, take acetaminophen every 6 hours as needed to get additional pain relief.  You may apply ice to the sore areas as needed.  Ice should be applied for 30 minutes at a time, 4 times a day.  If symptoms are worsening, and especially if you develop muscle weakness, return for reevaluation.

## 2020-12-17 NOTE — ED Provider Notes (Signed)
[facility] DEPT Provider Note   CSN: [id number] Arrival date & time: 12/16/20  2133     History Chief Complaint  Patient presents with  . Neck Pain    Taylor Bowen is a 35 y.o. female.  The history is provided by the patient.  Neck Pain She has history of GERD, and comes in complaining of pain in the left side of her neck which started 3 days ago.  Pain is now spreading to involve the left shoulder, left upper back, left upper chest, and left arm.  There is some numbness in the left arm.  There is no pain in the abdomen or lower back or leg.  Pain is rated at 10/10.  Curiously, pain does not bother her during the day when she is active.  It is much worse when she is at rest and, especially, as she tries to go to sleep.  She has not taken any medication for the pain.  She denies any trauma or unusual activity.   Past Medical History:  Diagnosis Date  . Anemia   . Constipation    Resolved per patient 08/01/17  . Genital warts    acid treatment in 2007  . GERD (gastroesophageal reflux disease)    occasional  . History of blood transfusion 05/25/2017   [place]  . Urinary tract infection     Patient Active Problem List   Diagnosis Date Noted  . Post-operative state 08/11/2017  . Fibroid uterus 05/26/2017  . Anemia due to chronic blood loss 05/25/2017  . GERD (gastroesophageal reflux disease) 01/30/2013  . Supervision of other normal pregnancy 11/07/2012    Past Surgical History:  Procedure Laterality Date  . HERNIA REPAIR     umbilical  . MYOMECTOMY N/A 08/11/2017   Procedure: MYOMECTOMY;  Surgeon: [name], MD;  Location: [facility];  Service: Gynecology;  Laterality: N/A;  . WISDOM TOOTH EXTRACTION       OB History    Gravida  3   Para  2   Term  2   Preterm  0   AB  1   Living  2     SAB  0   IAB  1   Ectopic  0   Multiple  0   Live Births  2           Family History  Problem Relation Age of Onset  .  Anesthesia problems Neg Hx   . Other Neg Hx   . Ataxia Neg Hx   . Chorea Neg Hx   . Dementia Neg Hx   . Mental retardation Neg Hx   . Migraines Neg Hx   . Multiple sclerosis Neg Hx   . Neurofibromatosis Neg Hx   . Neuropathy Neg Hx   . Parkinsonism Neg Hx   . Seizures Neg Hx   . Stroke Neg Hx     Social History   Tobacco Use  . Smoking status: Former Smoker    Packs/day: 1.00    Years: 3.00    Pack years: 3.00    Types: Cigarettes    Quit date: 02/07/2011    Years since quitting: 9.8  . Smokeless tobacco: Never Used  Vaping Use  . Vaping Use: Never used  Substance Use Topics  . Alcohol use: Yes    Comment: occasional  . Drug use: No    Home Medications Prior to Admission medications   Medication Sig Start Date End Date Taking? Authorizing Provider  Cetirizine HCl 10 MG CAPS Take 1 capsule (10 mg total) by mouth daily for 10 days. 06/29/18 07/09/18  Wieters, Hallie C, PA-C  etonogestrel (NEXPLANON) 68 MG IMPL implant 1 each by Subdermal route once.    [provider]  fluticasone (FLONASE) 50 MCG/ACT nasal spray Place 1-2 sprays into both nostrils daily for 7 days. 06/29/18 07/06/18  Wieters, Hallie C, PA-C  ibuprofen (ADVIL) 600 MG tablet Take 1 tablet (600 mg total) by mouth every 8 (eight) hours as needed for moderate pain. 12/17/19   Orvan July, NP    Allergies    Patient has no known allergies.  Review of Systems   Review of Systems  Musculoskeletal: Positive for neck pain.  All other systems reviewed and are negative.   Physical Exam Updated Vital Signs BP 118/67   Pulse 62   Temp 98.1 F (36.7 C) (Oral)   Resp 15   Ht 5\' 6"  (1.676 m)   Wt 64 kg   LMP 11/22/2020   SpO2 98%   BMI 22.76 kg/m   Physical Exam Vitals and nursing note reviewed.   35 year old female, resting comfortably and in no acute distress. Vital signs are normal. Oxygen saturation is 98%, which is normal. Head is normocephalic and atraumatic. PERRLA, EOMI. Oropharynx  is clear. Neck is nontender and supple without adenopathy or JVD. Back has mild tenderness in the soft tissues of the left upper back.  There is no CVA tenderness. Lungs are clear without rales, wheezes, or rhonchi. Chest is nontender. Heart has regular rate and rhythm without murmur. Abdomen is soft, flat, nontender without masses or hepatosplenomegaly and peristalsis is normoactive. Extremities have no cyanosis or edema, full range of motion is present. Skin is warm and dry without rash. Neurologic: Mental status is normal, cranial nerves are intact, there are no motor or sensory deficits.  Motor strength is 5/5 in all muscles of the left arm.  Careful sensory exam of the left arm, neck, chest, back shows no areas of decreased sensation.  ED Results / Procedures / Treatments    Procedures Procedures   Medications Ordered in ED Medications  naproxen (NAPROSYN) tablet 500 mg (has no administration in time range)  acetaminophen (TYLENOL) tablet 650 mg (has no administration in time range)    ED Course  I have reviewed the triage vital signs and the nursing notes.  MDM Rules/Calculators/A&P                         Pain in the left neck, back, chest, arm of uncertain cause.  This is likely purely musculoskeletal.  No evidence of radiculopathy.  No indication for imaging today.  She is discharged with prescription for naproxen and told to use over-the-counter acetaminophen as needed.  She is given referral to orthopedics for follow-up, return precautions discussed.  Old records are reviewed, she has no relevant past visits.  Final Clinical Impression(s) / ED Diagnoses Final diagnoses:  Neck pain  Pain in left arm  Upper back pain on left side    Rx / DC Orders ED Discharge Orders         Ordered    naproxen (NAPROSYN) 500 MG tablet  2 times daily        12/17/20 9169           Delora Fuel, MD 45/03/88 0145

## 2020-12-18 ENCOUNTER — Telehealth: Payer: Self-pay | Admitting: *Deleted

## 2020-12-18 NOTE — Telephone Encounter (Signed)
Transition Care Management Follow-up Telephone Call  Date of discharge and from where: 12/17/2020 - [facility]  How have you been since you were released from the hospital? "I am fine"  Any questions or concerns? No  Items Reviewed:  Did the pt receive and understand the discharge instructions provided? Yes   Medications obtained and verified? Yes   Other? No   Any new allergies since your discharge? No   Dietary orders reviewed? No  Do you have support at home? Yes    Functional Questionnaire: (I = Independent and D = Dependent) ADLs: I  Bathing/Dressing- I  Meal Prep- I  Eating- I  Maintaining continence- I  Transferring/Ambulation- I  Managing Meds- I  Follow up appointments reviewed:   PCP Hospital f/u appt confirmed? No    Specialist Hospital f/u appt confirmed? No    Are transportation arrangements needed? No   If their condition worsens, is the pt aware to call PCP or go to the Emergency Dept.? Yes  Was the patient provided with contact information for the PCP's office or ED? Yes  Was to pt encouraged to call back with questions or concerns? Yes

## 2020-12-19 DIAGNOSIS — M791 Myalgia, unspecified site: Secondary | ICD-10-CM | POA: Diagnosis not present

## 2020-12-19 DIAGNOSIS — K429 Umbilical hernia without obstruction or gangrene: Secondary | ICD-10-CM | POA: Diagnosis not present

## 2020-12-30 ENCOUNTER — Telehealth: Payer: Medicaid Other | Admitting: Family

## 2020-12-30 DIAGNOSIS — B373 Candidiasis of vulva and vagina: Secondary | ICD-10-CM | POA: Diagnosis not present

## 2020-12-30 DIAGNOSIS — B3731 Acute candidiasis of vulva and vagina: Secondary | ICD-10-CM

## 2020-12-30 DIAGNOSIS — N898 Other specified noninflammatory disorders of vagina: Secondary | ICD-10-CM

## 2020-12-30 MED ORDER — FLUCONAZOLE 150 MG PO TABS: 150.0000 mg | ORAL_TABLET | ORAL | 0 refills | Status: DC | PRN

## 2020-12-30 NOTE — Progress Notes (Signed)

## 2021-01-29 DIAGNOSIS — Z720 Tobacco use: Secondary | ICD-10-CM | POA: Diagnosis not present

## 2021-01-29 DIAGNOSIS — K432 Incisional hernia without obstruction or gangrene: Secondary | ICD-10-CM | POA: Diagnosis not present

## 2021-01-30 ENCOUNTER — Telehealth: Payer: Medicaid Other | Admitting: Family Medicine

## 2021-01-30 DIAGNOSIS — R300 Dysuria: Secondary | ICD-10-CM

## 2021-01-30 MED ORDER — NITROFURANTOIN MONOHYD MACRO 100 MG PO CAPS: 100.0000 mg | ORAL_CAPSULE | Freq: Two times a day (BID) | ORAL | 0 refills | Status: AC

## 2021-01-30 NOTE — Progress Notes (Signed)
Taylor Bowen, Taylor Bowen are scheduled for a virtual visit with your provider today.    Just as we do with appointments in the office, we must obtain your consent to participate.  Your consent will be active for this visit and any virtual visit you may have with one of our providers in the next 365 days.    If you have a MyChart account, I can also send a copy of this consent to you electronically.  All virtual visits are billed to your insurance company just like a traditional visit in the office.  As this is a virtual visit, video technology does not allow for your provider to perform a traditional examination.  This may limit your provider's ability to fully assess your condition.  If your provider identifies any concerns that need to be evaluated in person or the need to arrange testing such as labs, EKG, etc, we will make arrangements to do so.    Although advances in technology are sophisticated, we cannot ensure that it will always work on either your end or our end.  If the connection with a video visit is poor, we may have to switch to a telephone visit.  With either a video or telephone visit, we are not always able to ensure that we have a secure connection.   I need to obtain your verbal consent now.   Are you willing to proceed with your visit today?   Taylor Bowen has provided verbal consent on 01/30/2021 for a virtual visit (video or telephone).  E-Visit for Urinary Problems  We are sorry that you are not feeling well.  Here is how we plan to help!  Based on what you shared with me it looks like you most likely have a simple urinary tract infection.  A UTI (Urinary Tract Infection) is a bacterial infection of the bladder.  Most cases of urinary tract infections are simple to treat but a key part of your care is to encourage you to drink plenty of fluids and watch your symptoms carefully.  I have prescribed MacroBid 100 mg twice a day for 5 days.  Your symptoms should gradually improve.  Call us if the burning in your urine worsens, you develop worsening fever, back pain or pelvic pain or if your symptoms do not resolve after completing the antibiotic.  Urinary tract infections can be prevented by drinking plenty of water to keep your body hydrated.  Also be sure when you wipe, wipe from front to back and don't hold it in!  If possible, empty your bladder every 4 hours.  HOME CARE Drink plenty of fluids Compete the full course of the antibiotics even if the symptoms resolve Remember, when you need to go.go. Holding in your urine can increase the likelihood of getting a UTI! GET HELP RIGHT AWAY IF: You cannot urinate You get a high fever Worsening back pain occurs You see blood in your urine You feel sick to your stomach or throw up You feel like you are going to pass out  MAKE SURE YOU  Understand these instructions. Will watch your condition. Will get help right away if you are not doing well or get worse.   Thank you for choosing an e-visit.  Your e-visit answers were reviewed by a board certified advanced clinical practitioner to complete your personal care plan. Depending upon the condition, your plan could have included both over the counter or prescription medications.  Please review your pharmacy choice. Make sure the pharmacy  is open so you can pick up prescription now. If there is a problem, you may contact your provider through [employer] and have the prescription routed to another pharmacy.  Your safety is important to [place]. If you have drug allergies check your prescription carefully.   For the next 24 hours you can use MyChart to ask questions about today's visit, request a non-urgent call back, or ask for a work or school excuse. You will get an email in the next two days asking about your experience. I hope that your e-visit has been valuable and will speed your recovery.  I provided 7 minutes of non face-to-face time during this encounter for chart  review and documentation.    Taylor Mayo, NP 01/30/2021  3:04 PM

## 2021-02-04 ENCOUNTER — Other Ambulatory Visit: Payer: Self-pay | Admitting: General Surgery

## 2021-02-04 DIAGNOSIS — K432 Incisional hernia without obstruction or gangrene: Secondary | ICD-10-CM

## 2021-02-21 ENCOUNTER — Ambulatory Visit
Admission: RE | Admit: 2021-02-21 | Discharge: 2021-02-21 | Disposition: A | Discharge status: Home / Self Care | Payer: Medicaid Other | Source: Ambulatory Visit | Attending: General Surgery | Admitting: General Surgery

## 2021-02-21 DIAGNOSIS — Z9081 Acquired absence of spleen: Secondary | ICD-10-CM | POA: Diagnosis not present

## 2021-02-21 DIAGNOSIS — K429 Umbilical hernia without obstruction or gangrene: Secondary | ICD-10-CM | POA: Diagnosis not present

## 2021-02-21 DIAGNOSIS — K432 Incisional hernia without obstruction or gangrene: Secondary | ICD-10-CM

## 2021-02-21 DIAGNOSIS — Q8909 Congenital malformations of spleen: Secondary | ICD-10-CM | POA: Diagnosis not present

## 2021-03-17 ENCOUNTER — Ambulatory Visit: Payer: Self-pay | Admitting: General Surgery

## 2021-04-02 NOTE — Patient Instructions (Signed)
DUE TO COVID-19 ONLY ONE VISITOR IS ALLOWED TO COME WITH YOU AND STAY IN THE WAITING ROOM ONLY DURING PRE OP AND PROCEDURE.   **NO VISITORS ARE ALLOWED IN THE SHORT STAY AREA OR RECOVERY ROOM!!**  IF YOU WILL BE ADMITTED INTO THE HOSPITAL YOU ARE ALLOWED ONLY TWO SUPPORT PEOPLE DURING VISITATION HOURS ONLY (10AM -8PM)   The support person(s) may change daily. The support person(s) must pass our screening, gel in and out, and wear a mask at all times, including in the patient's room. Patients must also wear a mask when staff or their support person are in the room.  No visitors under the age of 39. Any visitor under the age of 9 must be accompanied by an adult.     Your procedure is scheduled on: 04/16/21   Report to [facility] Main  Entrance    Report to admitting at : 7:15 AM   Call this number if you have problems the morning of surgery [phone]   Do not eat food :After Midnight.   May have liquids until : 6:30 AM   day of surgery  CLEAR LIQUID DIET  Foods Allowed                                                                     Foods Excluded  Water, Black Coffee and tea, regular and decaf                             liquids that you cannot  Plain Jell-O in any flavor  (No red)                                           see through such as: Fruit ices (not with fruit pulp)                                     milk, soups, orange juice              Iced Popsicles (No red)                                    All solid food                                   Apple juices Sports drinks like Gatorade (No red) Lightly seasoned clear broth or consume(fat free) Sugar,   Sample Menu Breakfast                                Lunch                                     Supper Cranberry juice  Beef broth                            Chicken broth Jell-O                                     Grape juice                           Apple juice Coffee or tea                         Jell-O                                      Popsicle                                                Coffee or tea                        Coffee or tea       Oral Hygiene is also important to reduce your risk of infection.                                    Remember - BRUSH YOUR TEETH THE MORNING OF SURGERY WITH YOUR REGULAR TOOTHPASTE   Do NOT smoke after Midnight   Take these medicines the morning of surgery with A SIP OF WATER: N/A  DO NOT TAKE ANY ORAL DIABETIC MEDICATIONS DAY OF YOUR SURGERY                              You may not have any metal on your body including hair pins, jewelry, and body piercing             Do not wear make-up, lotions, powders, perfumes/cologne, or deodorant  Do not wear nail polish including gel and S&S, artificial/acrylic nails, or any other type of covering on natural nails including finger and toenails. If you have artificial nails, gel coating, etc. that needs to be removed by a nail salon please have this removed prior to surgery or surgery may need to be canceled/ delayed if the surgeon/ anesthesia feels like they are unable to be safely monitored.   Do not shave  48 hours prior to surgery.    Do not bring valuables to the hospital. [place].   Contacts, dentures or bridgework may not be worn into surgery.   Bring small overnight bag day of surgery.    Patients discharged the day of surgery will not be allowed to drive home.   Special Instructions: Bring a copy of your healthcare power of attorney and living will documents         the day of surgery if you haven't scanned them in before.  Please read over the following fact sheets you were given: IF YOU HAVE QUESTIONS ABOUT YOUR PRE OP INSTRUCTIONS PLEASE CALL [phone]   [facility] - Preparing for Surgery Before surgery, you can play an important role.  Because skin is not sterile, your skin needs to be as free of  germs as possible.  You can reduce the number of germs on your skin by washing with CHG (chlorahexidine gluconate) soap before surgery.  CHG is an antiseptic cleaner which kills germs and bonds with the skin to continue killing germs even after washing. Please DO NOT use if you have an allergy to CHG or antibacterial soaps.  If your skin becomes reddened/irritated stop using the CHG and inform your nurse when you arrive at Short Stay. Do not shave (including legs and underarms) for at least 48 hours prior to the first CHG shower.  You may shave your face/neck. Please follow these instructions carefully:  1.  Shower with CHG Soap the night before surgery and the  morning of Surgery.  2.  If you choose to wash your hair, wash your hair first as usual with your  normal  shampoo.  3.  After you shampoo, rinse your hair and body thoroughly to remove the  shampoo.                           4.  Use CHG as you would any other liquid soap.  You can apply chg directly  to the skin and wash                       Gently with a scrungie or clean washcloth.  5.  Apply the CHG Soap to your body ONLY FROM THE NECK DOWN.   Do not use on face/ open                           Wound or open sores. Avoid contact with eyes, ears mouth and genitals (private parts).                       Wash face,  Genitals (private parts) with your normal soap.             6.  Wash thoroughly, paying special attention to the area where your surgery  will be performed.  7.  Thoroughly rinse your body with warm water from the neck down.  8.  DO NOT shower/wash with your normal soap after using and rinsing off  the CHG Soap.                9.  Pat yourself dry with a clean towel.            10.  Wear clean pajamas.            11.  Place clean sheets on your bed the night of your first shower and do not  sleep with pets. Day of Surgery : Do not apply any lotions/deodorants the morning of surgery.  Please wear clean clothes to the  hospital/surgery center.  FAILURE TO FOLLOW THESE INSTRUCTIONS MAY RESULT IN THE CANCELLATION OF YOUR SURGERY PATIENT SIGNATURE_________________________________  NURSE SIGNATURE__________________________________  ________________________________________________________________________

## 2021-04-03 ENCOUNTER — Other Ambulatory Visit: Payer: Self-pay

## 2021-04-03 ENCOUNTER — Encounter (HOSPITAL_COMMUNITY): Payer: Self-pay

## 2021-04-03 ENCOUNTER — Encounter (HOSPITAL_COMMUNITY)
Admission: RE | Admit: 2021-04-03 | Discharge: 2021-04-03 | Disposition: A | Discharge status: Home / Self Care | Payer: Medicaid Other | Source: Ambulatory Visit | Attending: General Surgery | Admitting: General Surgery

## 2021-04-03 DIAGNOSIS — Z01812 Encounter for preprocedural laboratory examination: Secondary | ICD-10-CM | POA: Insufficient documentation

## 2021-04-03 LAB — CBC
HCT: 41.2 % (ref 36.0–46.0)
Hemoglobin: 13.4 g/dL (ref 12.0–15.0)
MCH: 28.2 pg (ref 26.0–34.0)
MCHC: 32.5 g/dL (ref 30.0–36.0)
MCV: 86.6 fL (ref 80.0–100.0)
Platelets: 211 10*3/uL (ref 150–400)
RBC: 4.76 MIL/uL (ref 3.87–5.11)
RDW: 12 % (ref 11.5–15.5)
WBC: 5.5 10*3/uL (ref 4.0–10.5)
nRBC: 0 % (ref 0.0–0.2)

## 2021-04-03 NOTE — Progress Notes (Signed)
COVID Vaccine Completed: Yes Date COVID Vaccine completed: 07/2020. X 2 COVID vaccine manufacturer:  Moderna     PCP - [facility]. Cardiologist -   Chest x-ray -  EKG -  Stress Test -  ECHO -  Cardiac Cath -  Pacemaker/ICD device last checked:  Sleep Study -  CPAP -   Fasting Blood Sugar -  Checks Blood Sugar _____ times a day  Blood Thinner Instructions: Aspirin Instructions: Last Dose:  Anesthesia review:   Patient denies shortness of breath, fever, cough and chest pain at PAT appointment   Patient verbalized understanding of instructions that were given to them at the PAT appointment. Patient was also instructed that they will need to review over the PAT instructions again at home before surgery.

## 2021-04-16 ENCOUNTER — Ambulatory Visit (HOSPITAL_COMMUNITY): Payer: Medicaid Other | Admitting: Registered Nurse

## 2021-04-16 ENCOUNTER — Encounter (HOSPITAL_COMMUNITY): Payer: Self-pay | Admitting: General Surgery

## 2021-04-16 ENCOUNTER — Encounter (HOSPITAL_COMMUNITY)
Admission: RE | Disposition: A | Discharge status: Home / Self Care | Payer: Self-pay | Source: Home / Self Care | Attending: General Surgery

## 2021-04-16 ENCOUNTER — Ambulatory Visit (HOSPITAL_COMMUNITY)
Admission: RE | Admit: 2021-04-16 | Discharge: 2021-04-16 | Disposition: A | Discharge status: Home / Self Care | Payer: Medicaid Other | Attending: General Surgery | Admitting: General Surgery

## 2021-04-16 DIAGNOSIS — D500 Iron deficiency anemia secondary to blood loss (chronic): Secondary | ICD-10-CM | POA: Diagnosis not present

## 2021-04-16 DIAGNOSIS — F17210 Nicotine dependence, cigarettes, uncomplicated: Secondary | ICD-10-CM | POA: Diagnosis not present

## 2021-04-16 DIAGNOSIS — K432 Incisional hernia without obstruction or gangrene: Secondary | ICD-10-CM | POA: Diagnosis not present

## 2021-04-16 DIAGNOSIS — K219 Gastro-esophageal reflux disease without esophagitis: Secondary | ICD-10-CM | POA: Diagnosis not present

## 2021-04-16 HISTORY — PX: INCISIONAL HERNIA REPAIR: SHX193

## 2021-04-16 LAB — PREGNANCY, URINE: Preg Test, Ur: NEGATIVE

## 2021-04-16 SURGERY — REPAIR, HERNIA, INCISIONAL
Anesthesia: General | Site: Abdomen

## 2021-04-16 MED ORDER — DEXAMETHASONE SODIUM PHOSPHATE 10 MG/ML IJ SOLN
INTRAMUSCULAR | Status: DC | PRN
  Administered 2021-04-16: 10 mg via INTRAVENOUS

## 2021-04-16 MED ORDER — ROCURONIUM BROMIDE 10 MG/ML (PF) SYRINGE
PREFILLED_SYRINGE | INTRAVENOUS | Status: AC
  Filled 2021-04-16: qty 10

## 2021-04-16 MED ORDER — FENTANYL CITRATE PF 50 MCG/ML IJ SOSY: 25.0000 ug | PREFILLED_SYRINGE | INTRAMUSCULAR | Status: DC | PRN

## 2021-04-16 MED ORDER — LACTATED RINGERS IV SOLN: INTRAVENOUS | Status: DC

## 2021-04-16 MED ORDER — LIDOCAINE HCL (PF) 2 % IJ SOLN
INTRAMUSCULAR | Status: AC
  Filled 2021-04-16: qty 5

## 2021-04-16 MED ORDER — SUGAMMADEX SODIUM 200 MG/2ML IV SOLN
INTRAVENOUS | Status: DC | PRN
  Administered 2021-04-16: 150 mg via INTRAVENOUS

## 2021-04-16 MED ORDER — SCOPOLAMINE 1 MG/3DAYS TD PT72
1.0000 | MEDICATED_PATCH | Freq: Once | TRANSDERMAL | Status: DC
  Administered 2021-04-16: 1.5 mg via TRANSDERMAL

## 2021-04-16 MED ORDER — ONDANSETRON HCL 4 MG/2ML IJ SOLN
INTRAMUSCULAR | Status: DC | PRN
  Administered 2021-04-16: 4 mg via INTRAVENOUS

## 2021-04-16 MED ORDER — IBUPROFEN 800 MG PO TABS: 800.0000 mg | ORAL_TABLET | Freq: Three times a day (TID) | ORAL | 0 refills | Status: DC | PRN

## 2021-04-16 MED ORDER — ONDANSETRON HCL 4 MG/2ML IJ SOLN
INTRAMUSCULAR | Status: AC
  Filled 2021-04-16: qty 2

## 2021-04-16 MED ORDER — OXYCODONE HCL 5 MG PO TABS: 5.0000 mg | ORAL_TABLET | Freq: Four times a day (QID) | ORAL | 0 refills | Status: DC | PRN

## 2021-04-16 MED ORDER — 0.9 % SODIUM CHLORIDE (POUR BTL) OPTIME
TOPICAL | Status: DC | PRN
  Administered 2021-04-16: 1000 mL

## 2021-04-16 MED ORDER — CHLORHEXIDINE GLUCONATE CLOTH 2 % EX PADS: 6.0000 | MEDICATED_PAD | Freq: Once | CUTANEOUS | Status: DC

## 2021-04-16 MED ORDER — ROCURONIUM BROMIDE 10 MG/ML (PF) SYRINGE
PREFILLED_SYRINGE | INTRAVENOUS | Status: DC | PRN
  Administered 2021-04-16: 60 mg via INTRAVENOUS

## 2021-04-16 MED ORDER — BUPIVACAINE LIPOSOME 1.3 % IJ SUSP
INTRAMUSCULAR | Status: DC | PRN
  Administered 2021-04-16: 20 mL

## 2021-04-16 MED ORDER — PROMETHAZINE HCL 25 MG/ML IJ SOLN: 6.2500 mg | INTRAMUSCULAR | Status: DC | PRN

## 2021-04-16 MED ORDER — PROPOFOL 10 MG/ML IV BOLUS
INTRAVENOUS | Status: DC | PRN
Start: 2021-04-16 — End: 2021-04-16
  Administered 2021-04-16: 140 mg via INTRAVENOUS

## 2021-04-16 MED ORDER — MIDAZOLAM HCL 2 MG/2ML IJ SOLN
INTRAMUSCULAR | Status: AC
  Filled 2021-04-16: qty 2

## 2021-04-16 MED ORDER — BUPIVACAINE LIPOSOME 1.3 % IJ SUSP
INTRAMUSCULAR | Status: AC
  Filled 2021-04-16: qty 20

## 2021-04-16 MED ORDER — SCOPOLAMINE 1 MG/3DAYS TD PT72
MEDICATED_PATCH | TRANSDERMAL | Status: AC
  Filled 2021-04-16: qty 1

## 2021-04-16 MED ORDER — CEFAZOLIN SODIUM-DEXTROSE 2-4 GM/100ML-% IV SOLN
2.0000 g | INTRAVENOUS | Status: AC
  Administered 2021-04-16: 2 g via INTRAVENOUS
  Filled 2021-04-16: qty 100

## 2021-04-16 MED ORDER — CHLORHEXIDINE GLUCONATE 0.12 % MT SOLN
15.0000 mL | Freq: Once | OROMUCOSAL | Status: AC
  Administered 2021-04-16: 15 mL via OROMUCOSAL

## 2021-04-16 MED ORDER — FENTANYL CITRATE (PF) 250 MCG/5ML IJ SOLN
INTRAMUSCULAR | Status: DC | PRN
  Administered 2021-04-16: 150 ug via INTRAVENOUS
  Administered 2021-04-16 (×2): 50 ug via INTRAVENOUS

## 2021-04-16 MED ORDER — BUPIVACAINE-EPINEPHRINE 0.25% -1:200000 IJ SOLN
INTRAMUSCULAR | Status: DC | PRN
  Administered 2021-04-16: 30 mL

## 2021-04-16 MED ORDER — ACETAMINOPHEN 500 MG PO TABS
1000.0000 mg | ORAL_TABLET | ORAL | Status: AC
  Administered 2021-04-16: 1000 mg via ORAL
  Filled 2021-04-16: qty 2

## 2021-04-16 MED ORDER — DEXAMETHASONE SODIUM PHOSPHATE 10 MG/ML IJ SOLN
INTRAMUSCULAR | Status: AC
  Filled 2021-04-16: qty 1

## 2021-04-16 MED ORDER — LIDOCAINE 2% (20 MG/ML) 5 ML SYRINGE
INTRAMUSCULAR | Status: DC | PRN
  Administered 2021-04-16: 100 mg via INTRAVENOUS

## 2021-04-16 MED ORDER — PROPOFOL 10 MG/ML IV BOLUS
INTRAVENOUS | Status: AC
  Filled 2021-04-16: qty 20

## 2021-04-16 MED ORDER — MIDAZOLAM HCL 5 MG/5ML IJ SOLN
INTRAMUSCULAR | Status: DC | PRN
  Administered 2021-04-16: 2 mg via INTRAVENOUS

## 2021-04-16 MED ORDER — ORAL CARE MOUTH RINSE: 15.0000 mL | Freq: Once | OROMUCOSAL | Status: AC

## 2021-04-16 MED ORDER — FENTANYL CITRATE (PF) 250 MCG/5ML IJ SOLN
INTRAMUSCULAR | Status: AC
  Filled 2021-04-16: qty 5

## 2021-04-16 MED ORDER — BUPIVACAINE-EPINEPHRINE (PF) 0.25% -1:200000 IJ SOLN
INTRAMUSCULAR | Status: AC
  Filled 2021-04-16: qty 30

## 2021-04-16 SURGICAL SUPPLY — 44 items
ADH SKN CLS APL DERMABOND .7 (GAUZE/BANDAGES/DRESSINGS) ×1
APL PRP STRL LF DISP 70% ISPRP (MISCELLANEOUS) ×1
APL SKNCLS STERI-STRIP NONHPOA (GAUZE/BANDAGES/DRESSINGS)
BAG COUNTER SPONGE SURGICOUNT (BAG) IMPLANT
BAG SPNG CNTER NS LX DISP (BAG)
BENZOIN TINCTURE PRP APPL 2/3 (GAUZE/BANDAGES/DRESSINGS) IMPLANT
CHLORAPREP W/TINT 26 (MISCELLANEOUS) ×2 IMPLANT
COVER SURGICAL LIGHT HANDLE (MISCELLANEOUS) ×2 IMPLANT
DECANTER SPIKE VIAL GLASS SM (MISCELLANEOUS) ×2 IMPLANT
DERMABOND ADVANCED (GAUZE/BANDAGES/DRESSINGS) ×1
DERMABOND ADVANCED .7 DNX12 (GAUZE/BANDAGES/DRESSINGS) IMPLANT
DRAIN CHANNEL 19F RND (DRAIN) IMPLANT
DRAPE LAPAROSCOPIC ABDOMINAL (DRAPES) ×2 IMPLANT
DRSG TELFA 4X10 ISLAND STR (GAUZE/BANDAGES/DRESSINGS) IMPLANT
DRSG TELFA 4X8 ISLAND (GAUZE/BANDAGES/DRESSINGS) IMPLANT
ELECT REM PT RETURN 15FT ADLT (MISCELLANEOUS) ×2 IMPLANT
EVACUATOR SILICONE 100CC (DRAIN) IMPLANT
GLOVE SURG POLYISO LF SZ7 (GLOVE) ×2 IMPLANT
GLOVE SURG UNDER POLY LF SZ7 (GLOVE) ×2 IMPLANT
GOWN STRL REUS W/TWL LRG LVL3 (GOWN DISPOSABLE) ×2 IMPLANT
GOWN STRL REUS W/TWL XL LVL3 (GOWN DISPOSABLE) ×2 IMPLANT
KIT BASIN OR (CUSTOM PROCEDURE TRAY) ×2 IMPLANT
KIT TURNOVER KIT A (KITS) ×2 IMPLANT
MESH HERNIA 6X6 BARD (Mesh General) IMPLANT
MESH HERNIA BARD 6X6 (Mesh General) ×1 IMPLANT
MESH VENTRALEX ST 8CM LRG (Mesh General) IMPLANT
NEEDLE HYPO 22GX1.5 SAFETY (NEEDLE) ×2 IMPLANT
PACK GENERAL/GYN (CUSTOM PROCEDURE TRAY) ×2 IMPLANT
SPONGE DRAIN TRACH 4X4 STRL 2S (GAUZE/BANDAGES/DRESSINGS) IMPLANT
SPONGE T-LAP 18X18 ~~LOC~~+RFID (SPONGE) ×2 IMPLANT
STRIP CLOSURE SKIN 1/2X4 (GAUZE/BANDAGES/DRESSINGS) IMPLANT
SUT ETHILON 2 0 PS N (SUTURE) IMPLANT
SUT MNCRL AB 4-0 PS2 18 (SUTURE) ×2 IMPLANT
SUT NOVA 0 T19/GS 22DT (SUTURE) IMPLANT
SUT NOVA NAB GS-21 0 18 T12 DT (SUTURE) ×1 IMPLANT
SUT PDS AB 0 CT1 36 (SUTURE) ×4 IMPLANT
SUT VIC AB 2-0 CT1 27 (SUTURE)
SUT VIC AB 2-0 CT1 TAPERPNT 27 (SUTURE) IMPLANT
SUT VIC AB 3-0 SH 18 (SUTURE) ×2 IMPLANT
SUT VIC AB 3-0 SH 27 (SUTURE)
SUT VIC AB 3-0 SH 27XBRD (SUTURE) IMPLANT
SYR 20ML LL LF (SYRINGE) ×2 IMPLANT
TOWEL OR 17X26 10 PK STRL BLUE (TOWEL DISPOSABLE) ×2 IMPLANT
TOWEL OR NON WOVEN STRL DISP B (DISPOSABLE) ×2 IMPLANT

## 2021-04-16 NOTE — H&P (Signed)
Subjective   Chief Complaint: No chief complaint on file.  History of Present Illness:  Taylor Bowen is a 35 y.o. female who is seen today as an office consultation at the request of Dr. None for evaluation of abdominal pain.   She had a hernia repair in 2010. In the last year she has noticed the hernia is getting larger. She gets abdominal pain when it is out. She reduces it with improvement. She wears a shaper to keep it in when she is active. She denies nausea or vomiting. She does have reflux symptoms.  She smoke 1-2 cigars a day  Review of Systems: A complete review of systems was obtained from the patient. I have reviewed this information and discussed as appropriate with the patient. See HPI as well for other ROS.  Review of Systems  Gastrointestinal: Positive for abdominal pain, heartburn and nausea.  All other systems reviewed and are negative.   Vitals:  01/29/21 1052  Pulse: 92  Temp: 36.7 C (98 F)  SpO2: 100%  Weight: 62.8 kg (138 lb 6.4 oz)  Height: 167.6 cm (5\' 6" )   Body mass index is 22.34 kg/m.  No Known Allergies  No current outpatient medications on file prior to visit.   No current facility-administered medications on file prior to visit.   There is no problem list on file for this patient.  Past Medical History:  Diagnosis Date   Anemia   GERD (gastroesophageal reflux disease)   Past Surgical History:  Procedure Laterality Date   MYOMECTOMY ABDOMINAL    History reviewed. No pertinent family history.   Social History   Tobacco Use  Smoking Status Current Every Day Smoker   Packs/day: 1.00   Types: Cigarettes  Smokeless Tobacco Never Used    Social History   Socioeconomic History   Marital status: Unknown  Tobacco Use   Smoking status: Current Every Day Smoker  Packs/day: 1.00  Types: Cigarettes   Smokeless tobacco: Never Used  Substance and Sexual Activity   Alcohol use: Yes  Comment: didn't state how much   Drug use:  Never   Objective:   Physical Exam Vitals reviewed.  Constitutional:  General: She is not in acute distress. Appearance: Normal appearance. She is not ill-appearing.  HENT:  Head: Normocephalic and atraumatic.  Right Ear: External ear normal.  Left Ear: External ear normal.  Nose: Nose normal. No congestion or rhinorrhea.  Mouth/Throat:  Mouth: Mucous membranes are moist.  Pharynx: Oropharynx is clear.  Eyes:  General: No scleral icterus.  Right eye: No discharge.  Left eye: No discharge.  Extraocular Movements: Extraocular movements intact.  Conjunctiva/sclera: Conjunctivae normal.  Pupils: Pupils are equal, round, and reactive to light.  Cardiovascular:  Rate and Rhythm: Normal rate and regular rhythm.  Pulses: Normal pulses.  Pulmonary:  Effort: Pulmonary effort is normal. No respiratory distress.  Breath sounds: No stridor.  Abdominal:  Palpations: Abdomen is soft. There is no mass.  Tenderness: There is no right CVA tenderness or left CVA tenderness.  Comments: Supraumbilical hernia, reducible in supine position  Musculoskeletal:  General: No swelling or tenderness. Normal range of motion.  Cervical back: Normal range of motion.  Skin: General: Skin is warm and dry.  Coloration: Skin is not jaundiced or pale.  Neurological:  General: No focal deficit present.  Mental Status: She is alert and oriented to person, place, and time.  Psychiatric:  Mood and Affect: Mood normal.  Behavior: Behavior normal.  Thought Content: Thought content normal.  Judgment: Judgment normal.   Labs, Imaging and Diagnostic Testing: I reviewed ED course from 0/63 and Dr. Colin Broach notes  I reviewed PCP note 12/23/20 showing pain after previous umbilical hernia repair site.  Assessment and Plan:  Diagnoses and all orders for this visit:  Incisional hernia, without obstruction or gangrene - CT abdomen pelvis without contrast - [facility] Case Posting Request; Future  Tobacco  abuse    35 yo female with hernia after umbilical hernia repair with mesh. We discussed etiology and possible treatment plans. She wanted to proceed with open repair with mesh as outpatient.  We discussed the details of the surgery, that it would be done with general anesthesia, with open incision and dissecting through scar tissue, reducing the hernia, and repairing with placement of mesh and reapproximating the muscle. We discussed risks of recurrence, infection, pain, bleeding, bruising, and visceral structure injury. She showed understanding and wanted to proceed.

## 2021-04-16 NOTE — Op Note (Signed)
PATIENT:  Taylor Bowen  35 y.o. female  PRE-OPERATIVE DIAGNOSIS:  INCISIONAL HERNIA  POST-OPERATIVE DIAGNOSIS:  INCISIONAL HERNIA  PROCEDURE:  Procedure(s): OPEN HERNIA REPAIR INCISIONAL WITH MESH   SURGEON:  Surgeon(s): Glendene Wyer, Arta Bruce, MD  ASSISTANT: none   ANESTHESIA:   local and general  Indications for procedure: [name] is a 35 y.o. year old female with symptoms of abdominal pain and bulge confirmed by CT scan for recurrent hernia.  Description of procedure: The patient was brought into the operative suite. Anesthesia was administered with General endotracheal anesthesia. WHO checklist was applied. The patient was then placed in supine. The area was prepped and draped in the usual sterile fashion.  Next the infraumbilical skin was anesthetized with Marcaine/Exparel mix. A semilunar infraumbilical incision was made. Cautery and blunt dissection was used to dissect down to the fascia. The hernia sac was dissected free from surrounding tissues in 360 degrees. The umbilical skin was dissected free.  There was a small supraumbilical hernia with fat tissue in the sac. It was completely dissected free and the fascia was identified. Attempt was made for a preperitoneal dissection but the inferior aspect was well scarred. Therefore, a retrorectus dissection was performed on each side. The fascial defect was then primarily closed with interrupted 0 PDS sutures. Marcaine/Exparel was injected into the muscle layer and around the fascia. A 15 x 15 cm Bard mesh was placed into the retrorectus space and sutured to the posterior rectus with interrupted 0 Novafil. A running 0 PDS was used to close the anterior rectus fascia. The umbilical skin was sutured to the fascia with a 3-0 vicryl. The deep dermal space was closed with a 3-0 vicryl. The skin was closed with a 4-0 monocryl subcuticular suture. Dermabond was put in place for dressing. The patient awoke from anesthesia and was  brought to pacu in stable condition. All counts were correct.  Findings: 2 cm incisional hernia  Specimen: none  Blood loss: 50 ml  Local anesthesia: 50 ml Marcaine/Exparel mix  Complications: none  PLAN OF CARE: Discharge to home after PACU  PATIENT DISPOSITION:  PACU - hemodynamically stable.  Gurney Maxin, M.D. General, Bariatric, & Minimally Invasive Surgery [facility], [place]  04/16/2021 11:41 AM

## 2021-04-16 NOTE — Transfer of Care (Signed)
Immediate Anesthesia Transfer of Care Note  Patient: Taylor Bowen  Procedure(s) Performed: OPEN HERNIA REPAIR INCISIONAL WITH MESH (Abdomen)  Patient Location: PACU  Anesthesia Type:General  Level of Consciousness: sedated  Airway & Oxygen Therapy: Patient Spontanous Breathing and Patient connected to face mask oxygen  Post-op Assessment: Report given to RN and Post -op Vital signs reviewed and stable  Post vital signs: Reviewed and stable  Last Vitals:  Vitals Value Taken Time  BP    Temp    Pulse    Resp    SpO2      Last Pain:  Vitals:   04/16/21 0809  TempSrc:   PainSc: 0-No pain         Complications: No notable events documented.

## 2021-04-16 NOTE — Anesthesia Procedure Notes (Signed)
Procedure Name: Intubation Date/Time: 04/16/2021 9:57 AM Performed by: Talbot Grumbling, CRNA Pre-anesthesia Checklist: Patient identified, Emergency Drugs available, Patient being monitored and Suction available Patient Re-evaluated:Patient Re-evaluated prior to induction Oxygen Delivery Method: Circle system utilized Preoxygenation: Pre-oxygenation with 100% oxygen Induction Type: IV induction Ventilation: Mask ventilation without difficulty Laryngoscope Size: Mac and 3 Grade View: Grade I Tube type: Oral Tube size: 7.0 mm Number of attempts: 1 Airway Equipment and Method: Stylet Placement Confirmation: positive ETCO2, ETT inserted through vocal cords under direct vision and breath sounds checked- equal and bilateral Secured at: 21 cm Tube secured with: Tape Dental Injury: Teeth and Oropharynx as per pre-operative assessment

## 2021-04-16 NOTE — Anesthesia Postprocedure Evaluation (Signed)
Anesthesia Post Note  Patient: Taylor Bowen  Procedure(s) Performed: OPEN HERNIA REPAIR INCISIONAL WITH MESH (Abdomen)     Patient location during evaluation: PACU Anesthesia Type: General Level of consciousness: awake and alert Pain management: pain level controlled Vital Signs Assessment: post-procedure vital signs reviewed and stable Respiratory status: spontaneous breathing, nonlabored ventilation, respiratory function stable and patient connected to nasal cannula oxygen Cardiovascular status: blood pressure returned to baseline and stable Postop Assessment: no apparent nausea or vomiting Anesthetic complications: no   No notable events documented.  Last Vitals:  Vitals:   04/16/21 1149 04/16/21 1200  BP: 126/63 128/85  Pulse: 66 (!) 57  Resp: 14 15  Temp: 36.5 C   SpO2: 99% 97%    Last Pain:  Vitals:   04/16/21 1149  TempSrc:   PainSc: [name]

## 2021-04-16 NOTE — Discharge Instructions (Signed)
[facility] _____[facility]  UMBILICAL OR INGUINAL HERNIA REPAIR: POST OP INSTRUCTIONS  Always review your discharge instruction sheet given to you by the facility where your surgery was performed. IF YOU HAVE DISABILITY OR FAMILY LEAVE FORMS, YOU MUST BRING THEM TO THE OFFICE FOR PROCESSING.   DO NOT GIVE THEM TO YOUR DOCTOR.  1. A  prescription for pain medication may be given to you upon discharge.  Take your pain medication as prescribed, if needed.  If narcotic pain medicine is not needed, then you may take acetaminophen (Tylenol) or ibuprofen (Advil) as needed. 2. Take your usually prescribed medications unless otherwise directed. If you need a refill on your pain medication, please contact your pharmacy.  They will contact our office to request authorization. Prescriptions will not be filled after 5 pm or on week-ends. 3. You should follow a light diet the first 24 hours after arrival home, such as soup and crackers, etc.  Be sure to include lots of fluids daily.  Resume your normal diet the day after surgery. 4.Most patients will experience some swelling and bruising around the umbilicus or in the groin and scrotum.  Ice packs and reclining will help.  Swelling and bruising can take several days to resolve.  6. It is common to experience some constipation if taking pain medication after surgery.  Increasing fluid intake and taking a stool softener (such as Colace) will usually help or prevent this problem from occurring.  A mild laxative (Milk of Magnesia or Miralax) should be taken according to package directions if there are no bowel movements after 48 hours. 7. Unless discharge instructions indicate otherwise, you may remove your bandages 24-48 hours after surgery, and you may shower at that time.  You may have steri-strips (small skin tapes) in place directly over the incision.  These strips should be left on the skin for 7-10 days.  If your surgeon used skin glue on the  incision, you may shower in 24 hours.  The glue will flake off over the next 2-3 weeks.  Any sutures or staples will be removed at the office during your follow-up visit. 8. ACTIVITIES:  You may resume regular (light) daily activities beginning the next day--such as daily self-care, walking, climbing stairs--gradually increasing activities as tolerated.  You may have sexual intercourse when it is comfortable.  Refrain from any heavy lifting or straining until approved by your doctor.  a.You may drive when you are no longer taking prescription pain medication, you can comfortably wear a seatbelt, and you can safely maneuver your car and apply brakes. b.RETURN TO WORK:   _____________________________________________  9.You should see your doctor in the office for a follow-up appointment approximately 2-3 weeks after your surgery.  Make sure that you call for this appointment within a day or two after you arrive home to insure a convenient appointment time. 10.OTHER INSTRUCTIONS: _________________________    _____________________________________  WHEN TO CALL YOUR DOCTOR: Fever over 101.0 Inability to urinate Nausea and/or vomiting Extreme swelling or bruising Continued bleeding from incision. Increased pain, redness, or drainage from the incision  The clinic staff is available to answer your questions during regular business hours.  Please don't hesitate to call and ask to speak to one of the nurses for clinical concerns.  If you have a medical emergency, go to the nearest emergency room or call 911.  A surgeon from [facility] is always on call at the hospital   [address], [address],   [place], [place]  [address] ?  [address] [address], , [place]   [address] [phone] ? [phone] ? FAX [phone] Web site: [email or web address]  

## 2021-04-16 NOTE — Anesthesia Preprocedure Evaluation (Addendum)
Anesthesia Evaluation  Patient identified by MRN, date of birth, ID band Patient awake    Reviewed: Allergy & Precautions, NPO status , Patient's Chart, lab work & pertinent test results  Airway Mallampati: I  TM Distance: >3 FB Neck ROM: Full    Dental  (+) Dental Advisory Given, Partial Upper,    Pulmonary Patient abstained from smoking., former smoker,    Pulmonary exam normal breath sounds clear to auscultation       Cardiovascular negative cardio ROS Normal cardiovascular exam Rhythm:Regular Rate:Normal     Neuro/Psych negative neurological ROS     GI/Hepatic Neg liver ROS, GERD  ,  Endo/Other  negative endocrine ROS  Renal/GU negative Renal ROS     Musculoskeletal INCISIONAL HERNIA   Abdominal   Peds  Hematology negative hematology ROS (+)   Anesthesia Other Findings   Reproductive/Obstetrics                          Anesthesia Physical Anesthesia Plan  ASA: 2  Anesthesia Plan: General   Post-op Pain Management:    Induction: Intravenous  PONV Risk Score and Plan: 4 or greater and Midazolam, Dexamethasone, Ondansetron and Scopolamine patch - Pre-op  Airway Management Planned: Oral ETT  Additional Equipment:   Intra-op Plan:   Post-operative Plan: Extubation in OR  Informed Consent: I have reviewed the patients History and Physical, chart, labs and discussed the procedure including the risks, benefits and alternatives for the proposed anesthesia with the patient or authorized representative who has indicated his/her understanding and acceptance.     Dental advisory given  Plan Discussed with: CRNA  Anesthesia Plan Comments:       Anesthesia Quick Evaluation

## 2021-04-17 ENCOUNTER — Encounter (HOSPITAL_COMMUNITY): Payer: Self-pay | Admitting: General Surgery

## 2021-04-28 ENCOUNTER — Telehealth: Payer: Medicaid Other | Admitting: Physician Assistant

## 2021-04-28 DIAGNOSIS — B9689 Other specified bacterial agents as the cause of diseases classified elsewhere: Secondary | ICD-10-CM | POA: Diagnosis not present

## 2021-04-28 DIAGNOSIS — N760 Acute vaginitis: Secondary | ICD-10-CM | POA: Diagnosis not present

## 2021-04-28 DIAGNOSIS — B3731 Acute candidiasis of vulva and vagina: Secondary | ICD-10-CM | POA: Diagnosis not present

## 2021-04-28 MED ORDER — METRONIDAZOLE 500 MG PO TABS: 500.0000 mg | ORAL_TABLET | Freq: Three times a day (TID) | ORAL | 0 refills | Status: DC

## 2021-04-28 MED ORDER — FLUCONAZOLE 150 MG PO TABS: 150.0000 mg | ORAL_TABLET | Freq: Once | ORAL | 0 refills | Status: AC

## 2021-04-28 MED ORDER — METRONIDAZOLE 500 MG PO TABS
500.0000 mg | ORAL_TABLET | Freq: Two times a day (BID) | ORAL | 0 refills | Status: AC
Start: 2021-04-28 — End: 2021-05-05

## 2021-04-28 NOTE — Progress Notes (Signed)
E-Visit for Vaginal Symptoms  We are sorry that you are not feeling well. Here is how we plan to help! Based on what you shared with me it looks like you: May have a vaginosis due to bacteria, called bacterial vaginosis. You may also have a yeast infection.  Vaginosis is an inflammation of the vagina that can result in discharge, itching and pain. The cause is usually a change in the normal balance of vaginal bacteria or an infection. Vaginosis can also result from reduced estrogen levels after menopause.  The most common causes of vaginosis are:   Bacterial vaginosis which results from an overgrowth of one on several organisms that are normally present in your vagina.   Yeast infections which are caused by a naturally occurring fungus called candida.   Vaginal atrophy (atrophic vaginosis) which results from the thinning of the vagina from reduced estrogen levels after menopause.   Trichomoniasis which is caused by a parasite and is commonly transmitted by sexual intercourse.  Factors that increase your risk of developing vaginosis include: Medications, such as antibiotics and steroids Uncontrolled diabetes Use of hygiene products such as bubble bath, vaginal spray or vaginal deodorant Douching Wearing damp or tight-fitting clothing Using an intrauterine device (IUD) for birth control Hormonal changes, such as those associated with pregnancy, birth control pills or menopause Sexual activity Having a sexually transmitted infection  Your treatment plan is Metronidazole or Flagyl 500mg  twice a day for 7 days.  I have electronically sent this prescription into the pharmacy that you have chosen. I have also prescribed diflucan for the yeast infection.   Be sure to take all of the medication as directed. Stop taking any medication if you develop a rash, tongue swelling or shortness of breath. Mothers who are breast feeding should consider pumping and discarding their breast milk while on  these antibiotics. However, there is no consensus that infant exposure at these doses would be harmful.  Remember that medication creams can weaken latex condoms. Marland Kitchen   HOME CARE:  Good hygiene may prevent some types of vaginosis from recurring and may relieve some symptoms:  Avoid baths, hot tubs and whirlpool spas. Rinse soap from your outer genital area after a shower, and dry the area well to prevent irritation. Don't use scented or harsh soaps, such as those with deodorant or antibacterial action. Avoid irritants. These include scented tampons and pads. Wipe from front to back after using the toilet. Doing so avoids spreading fecal bacteria to your vagina.  Other things that may help prevent vaginosis include:  Don't douche. Your vagina doesn't require cleansing other than normal bathing. Repetitive douching disrupts the normal organisms that reside in the vagina and can actually increase your risk of vaginal infection. Douching won't clear up a vaginal infection. Use a latex condom. Both female and female latex condoms may help you avoid infections spread by sexual contact. Wear cotton underwear. Also wear pantyhose with a cotton crotch. If you feel comfortable without it, skip wearing underwear to bed. Yeast thrives in [employer] Your symptoms should improve in the next day or two.  GET HELP RIGHT AWAY IF:  You have pain in your lower abdomen ( pelvic area or over your ovaries) You develop nausea or vomiting You develop a fever Your discharge changes or worsens You have persistent pain with intercourse You develop shortness of breath, a rapid pulse, or you faint.  These symptoms could be signs of problems or infections that need to be evaluated by a  medical provider now.  MAKE SURE YOU   Understand these instructions. Will watch your condition. Will get help right away if you are not doing well or get worse.  Thank you for choosing an e-visit.  Your e-visit answers  were reviewed by a board certified advanced clinical practitioner to complete your personal care plan. Depending upon the condition, your plan could have included both over the counter or prescription medications.  Please review your pharmacy choice. Make sure the pharmacy is open so you can pick up prescription now. If there is a problem, you may contact your provider through [employer] and have the prescription routed to another pharmacy.  Your safety is important to [place]. If you have drug allergies check your prescription carefully.   For the next 24 hours you can use MyChart to ask questions about today's visit, request a non-urgent call back, or ask for a work or school excuse. You will get an email in the next two days asking about your experience. I hope that your e-visit has been valuable and will speed your recovery.  I provided 6 minutes of non face-to-face time during this encounter for chart review and documentation.

## 2021-05-12 ENCOUNTER — Other Ambulatory Visit: Payer: Self-pay | Admitting: General Surgery

## 2021-05-12 DIAGNOSIS — R1033 Periumbilical pain: Secondary | ICD-10-CM

## 2021-05-29 ENCOUNTER — Ambulatory Visit
Admission: RE | Admit: 2021-05-29 | Discharge: 2021-05-29 | Disposition: A | Discharge status: Home / Self Care | Payer: Medicaid Other | Source: Ambulatory Visit | Attending: General Surgery | Admitting: General Surgery

## 2021-05-29 DIAGNOSIS — R1033 Periumbilical pain: Secondary | ICD-10-CM

## 2021-05-29 DIAGNOSIS — R1900 Intra-abdominal and pelvic swelling, mass and lump, unspecified site: Secondary | ICD-10-CM | POA: Diagnosis not present

## 2021-05-29 MED ORDER — IOPAMIDOL (ISOVUE-300) INJECTION 61%
100.0000 mL | Freq: Once | INTRAVENOUS | Status: AC | PRN
  Administered 2021-05-29: 100 mL via INTRAVENOUS

## 2021-07-03 ENCOUNTER — Telehealth: Payer: Medicaid Other | Admitting: Physician Assistant

## 2021-07-03 DIAGNOSIS — B3731 Acute candidiasis of vulva and vagina: Secondary | ICD-10-CM | POA: Diagnosis not present

## 2021-07-03 MED ORDER — FLUCONAZOLE 150 MG PO TABS
150.0000 mg | ORAL_TABLET | Freq: Once | ORAL | 0 refills | Status: AC
Start: 2021-07-03 — End: 2021-07-03

## 2021-07-03 NOTE — Progress Notes (Signed)

## 2021-07-03 NOTE — Progress Notes (Signed)
I have spent 5 minutes in review of e-visit questionnaire, review and updating patient chart, medical decision making and response to patient.   Cande Mastropietro Cody Carroll Lingelbach, PA-C    

## 2021-07-10 DIAGNOSIS — R1110 Vomiting, unspecified: Secondary | ICD-10-CM | POA: Diagnosis not present

## 2021-07-10 DIAGNOSIS — R197 Diarrhea, unspecified: Secondary | ICD-10-CM | POA: Diagnosis not present

## 2021-07-10 DIAGNOSIS — H8110 Benign paroxysmal vertigo, unspecified ear: Secondary | ICD-10-CM | POA: Diagnosis not present

## 2021-08-10 ENCOUNTER — Telehealth: Payer: Medicaid Other | Admitting: Physician Assistant

## 2021-08-10 DIAGNOSIS — B3731 Acute candidiasis of vulva and vagina: Secondary | ICD-10-CM

## 2021-08-10 MED ORDER — FLUCONAZOLE 150 MG PO TABS
150.0000 mg | ORAL_TABLET | Freq: Once | ORAL | 0 refills | Status: AC
Start: 2021-08-10 — End: 2021-08-10

## 2021-08-10 NOTE — Progress Notes (Signed)

## 2021-09-07 DIAGNOSIS — Z113 Encounter for screening for infections with a predominantly sexual mode of transmission: Secondary | ICD-10-CM | POA: Diagnosis not present

## 2021-09-07 DIAGNOSIS — Z30017 Encounter for initial prescription of implantable subdermal contraceptive: Secondary | ICD-10-CM | POA: Diagnosis not present

## 2021-09-22 DIAGNOSIS — H5213 Myopia, bilateral: Secondary | ICD-10-CM | POA: Diagnosis not present

## 2021-10-13 ENCOUNTER — Telehealth: Payer: Medicaid Other | Admitting: Physician Assistant

## 2021-10-13 DIAGNOSIS — N760 Acute vaginitis: Secondary | ICD-10-CM | POA: Diagnosis not present

## 2021-10-13 DIAGNOSIS — B9689 Other specified bacterial agents as the cause of diseases classified elsewhere: Secondary | ICD-10-CM

## 2021-10-13 MED ORDER — METRONIDAZOLE 500 MG PO TABS: 500.0000 mg | ORAL_TABLET | Freq: Two times a day (BID) | ORAL | 0 refills | Status: DC

## 2021-10-13 NOTE — Progress Notes (Signed)
I have spent 5 minutes in review of e-visit questionnaire, review and updating patient chart, medical decision making and response to patient.   Jakyri Brunkhorst Cody Daena Alper, PA-C    

## 2021-10-13 NOTE — Progress Notes (Signed)

## 2021-10-29 ENCOUNTER — Encounter: Payer: Medicaid Other | Admitting: Internal Medicine

## 2021-11-11 ENCOUNTER — Other Ambulatory Visit (HOSPITAL_COMMUNITY)
Admission: RE | Admit: 2021-11-11 | Discharge: 2021-11-11 | Disposition: A | Discharge status: Home / Self Care | Payer: Medicaid Other | Source: Ambulatory Visit | Attending: Internal Medicine | Admitting: Internal Medicine

## 2021-11-11 ENCOUNTER — Encounter: Payer: Self-pay | Admitting: Internal Medicine

## 2021-11-11 ENCOUNTER — Other Ambulatory Visit: Payer: Self-pay

## 2021-11-11 ENCOUNTER — Ambulatory Visit (INDEPENDENT_AMBULATORY_CARE_PROVIDER_SITE_OTHER): Payer: Medicaid Other | Admitting: Internal Medicine

## 2021-11-11 VITALS — BP 120/72 | HR 61 | Temp 99.1°F | Resp 24 | Ht 66.0 in | Wt 144.6 lb

## 2021-11-11 DIAGNOSIS — D259 Leiomyoma of uterus, unspecified: Secondary | ICD-10-CM | POA: Diagnosis not present

## 2021-11-11 DIAGNOSIS — Z0000 Encounter for general adult medical examination without abnormal findings: Secondary | ICD-10-CM | POA: Diagnosis present

## 2021-11-11 DIAGNOSIS — G514 Facial myokymia: Secondary | ICD-10-CM | POA: Diagnosis not present

## 2021-11-11 DIAGNOSIS — B3731 Acute candidiasis of vulva and vagina: Secondary | ICD-10-CM

## 2021-11-11 NOTE — Patient Instructions (Addendum)
Ms Taylor Bowen, ? ?It was a pleasure seeing you in clinic. Today we discussed:  ? ?Facial twitching: ?I am checking for any electrolyte abnormalities that may be contributing to this.  ?I will call you with any abnormal results and follow up. ?This may be due to stress and/or eye strain.  ? ?Vaginal itching: ?I am checking for candida infection. I will call you with the results and will call in medication appropriately. ? ?Healthcare maintenance: ?Pap smear done at this visit ? ?If you have any questions or concerns, please call our clinic at [phone] between 9am-5pm and after hours call [phone] and ask for the internal medicine resident on call. If you feel you are having a medical emergency please call 911.  ? ?Thank you, we look forward to helping you remain healthy! ? ? ?

## 2021-11-11 NOTE — Progress Notes (Signed)
? ?CC: facial twitching ? ?HPI: ? ?[name] is a 36 y.o. female with PMHx as stated below presenting for evaluation of facial twitching and to establish care. She reports that she has had facial twitching for the past year with usually having 2-3 episodes per month but has increased in frequency over the past few weeks. Please see problem based charting for complete assessment and plan.  ? ?Past Medical History:  ?Diagnosis Date  ? Anemia   ? Constipation   ? Resolved per patient 08/01/17  ? Genital warts   ? acid treatment in 2007  ? GERD (gastroesophageal reflux disease)   ? occasional  ? History of blood transfusion 05/25/2017  ? [place]  ? Urinary tract infection   ? ?Review of Systems:  Negative except as stated in HPI. ? ?Past Surgical History:  ?Procedure Laterality Date  ? HERNIA REPAIR    ? umbilical  ? INCISIONAL HERNIA REPAIR N/A 04/16/2021  ? Procedure: OPEN HERNIA REPAIR INCISIONAL WITH MESH;  Surgeon: Kinsinger, Arta Bruce, MD;  Location: [facility];  Service: General;  Laterality: N/A;  ? MYOMECTOMY N/A 08/11/2017  ? Procedure: MYOMECTOMY;  Surgeon: [name], MD;  Location: [facility];  Service: Gynecology;  Laterality: N/A;  ? WISDOM TOOTH EXTRACTION    ? ?Family Hx: ?Multiple types of cancer but denies history of breast cancer ?No known heart disease, hypertension, diabetes or thyroid disease  ? ?Social History  ? ?Socioeconomic History  ? Marital status: Single  ?  Spouse name: Not on file  ? Number of children: Not on file  ? Years of education: Not on file  ? Highest education level: Not on file  ?Occupational History  ? Not on file  ?Tobacco Use  ? Smoking status: Former  ?  Packs/day: 1.00  ?  Years: 3.00  ?  Pack years: 3.00  ?  Types: Cigarettes  ?  Quit date: 01/15/2011  ?  Years since quitting: 10.8  ? Smokeless tobacco: Never  ? Tobacco comments:  ?  Stopped 2 weeks ago  ?Vaping Use  ? Vaping Use: Every day  ?Substance and Sexual Activity  ? Alcohol use: Yes  ?  Comment: occasional  ? Drug  use: No  ? Sexual activity: Yes  ?  Birth control/protection: Implant  ?  Comment: nexplanon to be removed May or June 2019  ?Other Topics Concern  ? Not on file  ?Social History Narrative  ? Not on file  ? ?Social Determinants of Health  ? ?Financial Resource Strain: Not on file  ?Food Insecurity: Not on file  ?Transportation Needs: Not on file  ?Physical Activity: Not on file  ?Stress: Not on file  ?Social Connections: Not on file  ?Intimate Partner Violence: Not on file  ?Currently works at [employer], [place]. Has two children, son and daughter, ages 71 and 8, respectively. Former smoker, stopped 2 weeks ago. Smoked 1 pack per day intermittently since age 36. Endorses alcohol use - drinks 1 pint of tequilla 3 weekends out of the month; no black outs. Uses edibles - 1 per week.  ? ? ?Physical Exam: ? ?Vitals:  ? 11/11/21 1536  ?BP: 120/72  ?Pulse: 61  ?Resp: (!) 24  ?Temp: 99.1 ?F (37.3 ?C)  ?TempSrc: Oral  ?SpO2: 99%  ?Weight: 144 lb 9.6 oz (65.6 kg)  ?Height: '5\' 6"'$  (1.676 m)  ? ?Physical Exam  ?Constitutional: Appears well-developed and well-nourished. No distress.  ?HENT: Normocephalic and atraumatic, EOMI, conjunctiva normal, moist  mucous membranes ?Cardiovascular: Normal rate, regular rhythm, S1 and S2 present, no murmurs, rubs, gallops.  Distal pulses intact ?Respiratory: Effort is normal.  Lungs are clear to auscultation bilaterally. ?GI: Nondistended, soft, nontender to palpation, normal active bowel sounds; +umbilical hernia, reducible ?Musculoskeletal: Normal bulk and tone.  No peripheral edema noted. ?Neurological: Is alert and oriented x4, CN II-XII intact, no apparent focal deficits noted. ?Skin: Warm and dry.  No rash, erythema, lesions noted. ?Psychiatric: Normal mood and affect.  ? ?Assessment & Plan:  ? ?See Encounters Tab for problem based charting. ? ?Patient discussed with Dr. Heber Emmonak ? ?

## 2021-11-12 LAB — CERVICOVAGINAL ANCILLARY ONLY
Bacterial Vaginitis (gardnerella): NEGATIVE
Candida Glabrata: NEGATIVE
Candida Vaginitis: NEGATIVE
Chlamydia: NEGATIVE
Comment: NEGATIVE
Comment: NEGATIVE
Comment: NEGATIVE
Comment: NEGATIVE
Comment: NEGATIVE
Comment: NORMAL
Neisseria Gonorrhea: NEGATIVE
Trichomonas: NEGATIVE

## 2021-11-12 LAB — BMP8+ANION GAP
Anion Gap: 14 mmol/L (ref 10.0–18.0)
BUN/Creatinine Ratio: 11 (ref 9–23)
BUN: 9 mg/dL (ref 6–20)
CO2: 23 mmol/L (ref 20–29)
Calcium: 9.5 mg/dL (ref 8.7–10.2)
Chloride: 103 mmol/L (ref 96–106)
Creatinine, Ser: 0.81 mg/dL (ref 0.57–1.00)
Glucose: 77 mg/dL (ref 70–99)
Potassium: 4.1 mmol/L (ref 3.5–5.2)
Sodium: 140 mmol/L (ref 134–144)
eGFR: 98 mL/min/{1.73_m2} (ref >59)

## 2021-11-13 DIAGNOSIS — G514 Facial myokymia: Secondary | ICD-10-CM | POA: Insufficient documentation

## 2021-11-13 DIAGNOSIS — B3731 Acute candidiasis of vulva and vagina: Secondary | ICD-10-CM | POA: Insufficient documentation

## 2021-11-13 DIAGNOSIS — Z0000 Encounter for general adult medical examination without abnormal findings: Secondary | ICD-10-CM | POA: Insufficient documentation

## 2021-11-13 NOTE — Assessment & Plan Note (Signed)
Pap smear performed at this visit ?

## 2021-11-13 NOTE — Assessment & Plan Note (Addendum)
Patient is presenting for evaluation of left sided facial twitching for approximately one year duration. She notes that she intermittently has episodes of left sided facial twitching, mostly around her left eye and cheek. Initially she would only have 2-3 episodes per month; however, the frequency of these episodes has since increased. Especially over the past few weeks, patient has noted multiple episodes in a day. She denies twitching anywhere else. She updated her prescription glasses yesterday. Cranial nerve exam without any deficits noted. Twitching is not reproducible by touch. BMP obtained without any electrolyte derangements. ?Suspect this could be in setting of excessive eye strain vs stress vs excessive caffeine. Results discussed with patient. Will continue to monitor. ? ? ?

## 2021-11-13 NOTE — Progress Notes (Signed)
Internal Medicine Clinic Attending ° °Case discussed with Dr. Aslam  At the time of the visit.  We reviewed the resident’s history and exam and pertinent patient test results.  I agree with the assessment, diagnosis, and plan of care documented in the resident’s note.  °

## 2021-11-13 NOTE — Assessment & Plan Note (Signed)
Patient previously treated for yeast infection - she notes that she is prone to these. She reports improvement in symptoms following treatment but notes that she's been having some vaginal itching again. GU exam benign with mild physiologic discharge. Cervicovaginal ancillary testing nl without evidence of yeast infection ? ?Plan: ?Continue to monitor  ?

## 2021-11-16 LAB — CYTOLOGY - PAP
Adequacy: ABSENT
Comment: NEGATIVE
Diagnosis: UNDETERMINED — AB
High risk HPV: NEGATIVE

## 2022-01-03 DIAGNOSIS — M940 Chondrocostal junction syndrome [Tietze]: Secondary | ICD-10-CM | POA: Diagnosis not present

## 2022-01-03 DIAGNOSIS — M542 Cervicalgia: Secondary | ICD-10-CM | POA: Diagnosis not present

## 2022-01-17 ENCOUNTER — Telehealth: Payer: Medicaid Other | Admitting: Family

## 2022-01-17 DIAGNOSIS — R399 Unspecified symptoms and signs involving the genitourinary system: Secondary | ICD-10-CM

## 2022-01-17 MED ORDER — CEPHALEXIN 500 MG PO CAPS: 500.0000 mg | ORAL_CAPSULE | Freq: Two times a day (BID) | ORAL | 0 refills | Status: DC

## 2022-01-17 NOTE — Progress Notes (Signed)
E-Visit for Urinary Problems  We are sorry that you are not feeling well.  Here is how we plan to help!  Based on what you shared with me it looks like you most likely have a simple urinary tract infection.  A UTI (Urinary Tract Infection) is a bacterial infection of the bladder.  Most cases of urinary tract infections are simple to treat but a key part of your care is to encourage you to drink plenty of fluids and watch your symptoms carefully.  I have prescribed Keflex 500 mg twice a day for 7 days.  Your symptoms should gradually improve. Call us if the burning in your urine worsens, you develop worsening fever, back pain or pelvic pain or if your symptoms do not resolve after completing the antibiotic.  Urinary tract infections can be prevented by drinking plenty of water to keep your body hydrated.  Also be sure when you wipe, wipe from front to back and don't hold it in!  If possible, empty your bladder every 4 hours.  HOME CARE Drink plenty of fluids Compete the full course of the antibiotics even if the symptoms resolve Remember, when you need to go.go. Holding in your urine can increase the likelihood of getting a UTI! GET HELP RIGHT AWAY IF: You cannot urinate You get a high fever Worsening back pain occurs You see blood in your urine You feel sick to your stomach or throw up You feel like you are going to pass out  MAKE SURE YOU  Understand these instructions. Will watch your condition. Will get help right away if you are not doing well or get worse.   Thank you for choosing an e-visit.  Your e-visit answers were reviewed by a board certified advanced clinical practitioner to complete your personal care plan. Depending upon the condition, your plan could have included both over the counter or prescription medications.  Please review your pharmacy choice. Make sure the pharmacy is open so you can pick up prescription now. If there is a problem, you may contact your  provider through MyChart messaging and have the prescription routed to another pharmacy.  Your safety is important to us. If you have drug allergies check your prescription carefully.   For the next 24 hours you can use MyChart to ask questions about today's visit, request a non-urgent call back, or ask for a work or school excuse. You will get an email in the next two days asking about your experience. I hope that your e-visit has been valuable and will speed your recovery.  Approximately 5 minutes was spent documenting and reviewing patient's chart.    

## 2022-02-01 ENCOUNTER — Ambulatory Visit: Payer: Medicaid Other | Admitting: Student

## 2022-02-01 ENCOUNTER — Encounter: Payer: Self-pay | Admitting: Student

## 2022-02-01 VITALS — BP 119/76 | HR 80 | Temp 98.1°F | Ht 66.0 in | Wt 148.1 lb

## 2022-02-01 DIAGNOSIS — R1084 Generalized abdominal pain: Secondary | ICD-10-CM | POA: Diagnosis not present

## 2022-02-01 DIAGNOSIS — G514 Facial myokymia: Secondary | ICD-10-CM | POA: Diagnosis not present

## 2022-02-01 DIAGNOSIS — K219 Gastro-esophageal reflux disease without esophagitis: Secondary | ICD-10-CM

## 2022-02-01 DIAGNOSIS — R109 Unspecified abdominal pain: Secondary | ICD-10-CM | POA: Insufficient documentation

## 2022-02-01 DIAGNOSIS — R1033 Periumbilical pain: Secondary | ICD-10-CM

## 2022-02-01 MED ORDER — OMEPRAZOLE 20 MG PO CPDR: 20.0000 mg | DELAYED_RELEASE_CAPSULE | Freq: Every day | ORAL | 0 refills | Status: DC

## 2022-02-01 NOTE — Assessment & Plan Note (Addendum)
Assessment: Patient with history of 3 abdominal surgeries for abdominal hernias.  Her first abdominal surgery was when she was a child for an umbilical hernia.  She is unclear of when this was or where it occurred.  In 2010 she had another abdominal surgery for another hernia.  She states that on discharge from this hospitalization she had swelling in the area.  The surgery was repeated years later she had another hernia repair surgery.  Since then she has had swelling and pain in this area.  She has always had a baseline pain but more recently she will have episodes of sharp pain. It is worse with activity or movement. Denies pain during or after meals. Able to tolerate PO intake without difficulty.  No trouble passing her bowels.  She states she has tried to follow-up with her surgeon however the soonest they can get her and is an 2 months.  She had a repeat CT scan in November that showed a seroma versus a hematoma.   On physical exam today, she has a soft, flat abdomen.  Bowel sounds present.  She has a 2 inch in diameter round raised area to the right of her umbilicus that is soft, and tender to deep palpation.  Low suspicion that this is a hernia, but is more consistent with a hematoma or seroma.   Instructed patient to keep her follow-up with her current surgeon and we will repeat CT imaging.  Do not suspect an acute abdomen or need for emergent imaging or admission.  Instructed Tylenol for pain and ibuprofen as needed but not Toprol multiple times a day with her history of GERD.  Plan: -CT abdomen pelvis with contrast -Follow-up with general surgery -Tylenol and ibuprofen for pain  Addendum: CT- Postsurgical change of prior ventral hernia repair with near complete involution/resolution of the previously identified fluid collection along the hernia repair site.

## 2022-02-01 NOTE — Assessment & Plan Note (Addendum)
Assessment: Patient notes that the facial twitching that she was evaluated for during her last visit at [facility] has now improved.  She noticed a correlation between her levels of stress and anxiety as well as caffeine intake with increased facial twitching.  She continues to have it but it is not as severe.  Unclear etiology at this time.  She denies any visual changes.  We will continue to monitor.  Lab work on prior visit was unremarkable, can consider magnesium at follow up visit. Discussed with her if symptoms persist or worsen will need to be evaluated sooner and further consideration for other disease processes such as MS.   Plan: -Continue monitor symptoms

## 2022-02-01 NOTE — Progress Notes (Signed)
CC: Abdominal pain, facial twitching  HPI:  Taylor Bowen is a 36 y.o. female living with a history stated below and presents today for follow up of her abdominal pain and facial twitching. Please see problem based assessment and plan for additional details.  Past Medical History:  Diagnosis Date   Anemia    Constipation    Resolved per patient 08/01/17   Genital warts    acid treatment in 2007   GERD (gastroesophageal reflux disease)    occasional   History of blood transfusion 05/25/2017   [facility]   Urinary tract infection     Current Outpatient Medications on File Prior to Visit  Medication Sig Dispense Refill   cephALEXin (KEFLEX) 500 MG capsule Take 1 capsule (500 mg total) by mouth 2 (two) times daily. 14 capsule 0   fluticasone (FLONASE) 50 MCG/ACT nasal spray Place 1-2 sprays into both nostrils daily for 7 days. 1 g 0   ibuprofen (ADVIL) 800 MG tablet Take 1 tablet (800 mg total) by mouth every 8 (eight) hours as needed. 30 tablet 0   naphazoline-pheniramine (VISINE) 0.025-0.3 % ophthalmic solution Place 1 drop into both eyes daily as needed (Dry eye).     No current facility-administered medications on file prior to visit.    Family History  Problem Relation Age of Onset   Anesthesia problems Neg Hx    Other Neg Hx    Ataxia Neg Hx    Chorea Neg Hx    Dementia Neg Hx    Mental retardation Neg Hx    Migraines Neg Hx    Multiple sclerosis Neg Hx    Neurofibromatosis Neg Hx    Neuropathy Neg Hx    Parkinsonism Neg Hx    Seizures Neg Hx    Stroke Neg Hx     Social History   Socioeconomic History   Marital status: Single    Spouse name: Not on file   Number of children: Not on file   Years of education: Not on file   Highest education level: Not on file  Occupational History   Not on file  Tobacco Use   Smoking status: Former    Packs/day: 1.00    Years: 3.00    Total pack years: 3.00    Types: Cigarettes    Quit date: 01/15/2011    Years since  quitting: 11.0   Smokeless tobacco: Never   Tobacco comments:    Stopped 2 weeks ago  Vaping Use   Vaping Use: Every day  Substance and Sexual Activity   Alcohol use: Yes    Comment: occasional   Drug use: No   Sexual activity: Yes    Birth control/protection: Implant    Comment: nexplanon to be removed May or June 2019  Other Topics Concern   Not on file  Social History Narrative   Not on file   Social Determinants of Health   Financial Resource Strain: Not on file  Food Insecurity: Not on file  Transportation Needs: Not on file  Physical Activity: Not on file  Stress: Not on file  Social Connections: Not on file  Intimate Partner Violence: Not on file    Review of Systems: ROS negative except for what is noted on the assessment and plan.  Vitals:   02/01/22 0907  BP: 119/76  Pulse: 80  Temp: 98.1 F (36.7 C)  TempSrc: Oral  SpO2: 100%  Weight: 148 lb 1.6 oz (67.2 kg)  Height: '5\' 6"'$  (1.676  m)    Physical Exam: Constitutional: well-appearing, in no acute distress HENT: normocephalic atraumatic Eyes: conjunctiva non-erythematous Neck: supple Cardiovascular: regular rate and rhythm, no m/r/g Pulmonary/Chest: normal work of breathing on room air Abdominal: soft, non-distended. 2cm raised area to the right of the umbilicus.  Normal bowel sounds. MSK: normal bulk and tone Neurological: alert & oriented x 3 Skin: warm and dry Psych: Normal mood and thought process  Assessment & Plan:   GERD (gastroesophageal reflux disease) Assessment: Patient endorses history of acid reflux that has improved with medical therapy in the past.  She was diagnosed when she was pregnant.  She describes the symptoms as food getting stuck in her throat.  This only occurs when she eats spicy foods or lies flat and eats.  She is uncertain of what medications she has been on in the past on chart review it appears she has been on famotidine, and omeprazole.  She denies dysphagia, weight  loss, night sweats, or inability to tolerate any p.o. intake.  We discussed if any of the symptoms occur she needs to return to clinic immediately.  We will start her on omeprazole and follow-up in 1 month.  Will eventually trial off of acid suppressant therapy and if symptoms persist can consider H. pylori testing.  Do not believe she needs EGD testing at this time.  Plan: -Omeprazole 20 mg daily -Follow-up in 1 month for resolution of symptoms and trial off of PPI with possible H. pylori testing -Red flag symptoms discussed, return precautions given  Facial twitching Assessment: Patient notes that the facial twitching that she was evaluated for during her last visit at [facility] has now improved.  She noticed a correlation between her levels of stress and anxiety as well as caffeine intake with increased facial twitching.  She continues to have it but it is not as severe.  Unclear etiology at this time.  She denies any visual changes.  We will continue to monitor.  Lab work on prior visit was unremarkable, can consider magnesium at follow up visit. Discussed with her if symptoms persist or worsen will need to be evaluated sooner and further consideration for other disease processes such as MS.   Plan: -Continue monitor symptoms  Abdominal pain Assessment: Patient with history of 3 abdominal surgeries for abdominal hernias.  Her first abdominal surgery was when she was a child for an umbilical hernia.  She is unclear of when this was or where it occurred.  In 2010 she had another abdominal surgery for another hernia.  She states that on discharge from this hospitalization she had swelling in the area.  The surgery was repeated years later she had another hernia repair surgery.  Since then she has had swelling and pain in this area.  She has always had a baseline pain but more recently she will have episodes of sharp pain. It is worse with activity or movement. Denies pain during or after meals. Able to  tolerate PO intake without difficulty.  No trouble passing her bowels.  She states she has tried to follow-up with her surgeon however the soonest they can get her and is an 2 months.  She had a repeat CT scan in November that showed a seroma versus a hematoma.   On physical exam today, she has a soft, flat abdomen.  Bowel sounds present.  She has a 2 inch in diameter round raised area to the right of her umbilicus that is soft, and tender to deep palpation.  Low suspicion that this is a hernia, but is more consistent with a hematoma or seroma.   Instructed patient to keep her follow-up with her current surgeon and we will repeat CT imaging.  Do not suspect an acute abdomen or need for emergent imaging or admission.  Instructed Tylenol for pain and ibuprofen as needed but not Toprol multiple times a day with her history of GERD.  Plan: -CT abdomen pelvis with contrast -Follow-up with general surgery -Tylenol and ibuprofen for pain  Patient discussed with Dr. Delano Metz, D.O. [facility], PGY-3 Phone: [phone] Date 02/01/2022 Time 8:08 PM

## 2022-02-01 NOTE — Assessment & Plan Note (Signed)
Assessment: Patient endorses history of acid reflux that has improved with medical therapy in the past.  She was diagnosed when she was pregnant.  She describes the symptoms as food getting stuck in her throat.  This only occurs when she eats spicy foods or lies flat and eats.  She is uncertain of what medications she has been on in the past on chart review it appears she has been on famotidine, and omeprazole.  She denies dysphagia, weight loss, night sweats, or inability to tolerate any p.o. intake.  We discussed if any of the symptoms occur she needs to return to clinic immediately.  We will start her on omeprazole and follow-up in 1 month.  Will eventually trial off of acid suppressant therapy and if symptoms persist can consider H. pylori testing.  Do not believe she needs EGD testing at this time.  Plan: -Omeprazole 20 mg daily -Follow-up in 1 month for resolution of symptoms and trial off of PPI with possible H. pylori testing -Red flag symptoms discussed, return precautions given

## 2022-02-01 NOTE — Patient Instructions (Signed)
Thank you, [name] for allowing [place] to provide your care today. Today we discussed.  Abdominal pain Please look out for a phone call to schedule a CT scan. Please take tylenol for your pain and if you are going to take ibuprofen, make sure it is not daily. I will follow up with the results  Acid Reflux Please take the omeprazole daily and follow up in one month to see how you are doing with this. If you notice food is getting stuck more often, you start having pain with swallowing, or have episodes of night sweats regularly, please come in to be seen  Facial Twitching I am glad this is better. Please call our clinic if you have recurrence of this. If stress and anxiety returns and you have trouble managing it, please let us know.   I have ordered the following labs for you:  Lab Orders  No laboratory test(s) ordered today      Referrals ordered today:   Referral Orders  No referral(s) requested today     I have ordered the following medication/changed the following medications:   Stop the following medications: There are no discontinued medications.   Start the following medications: Meds ordered this encounter  Medications   omeprazole (PRILOSEC) 20 MG capsule    Sig: Take 1 capsule (20 mg total) by mouth daily.    Dispense:  30 capsule    Refill:  0     Follow up:  1 month follow up acid reflux     Should you have any questions or concerns please call the internal medicine clinic at [phone].    Sanjuana Letters, D.O. [place]

## 2022-02-04 NOTE — Progress Notes (Signed)
Internal Medicine Clinic Attending  Case discussed with the resident at the time of the visit.  We reviewed the resident's history and exam and pertinent patient test results.  I agree with the assessment, diagnosis, and plan of care documented in the resident's note.  

## 2022-02-18 ENCOUNTER — Telehealth: Payer: Self-pay | Admitting: *Deleted

## 2022-02-18 NOTE — Telephone Encounter (Signed)
Called patient left voice message regarding appointment 02-25-2022 @ 9:00 am at [facility] for [place], if unable to keep to call [phone] to reschedule. Appointment put in mail also.

## 2022-02-25 ENCOUNTER — Ambulatory Visit (HOSPITAL_COMMUNITY): Payer: Medicaid Other

## 2022-03-19 ENCOUNTER — Ambulatory Visit (HOSPITAL_COMMUNITY)
Admission: RE | Admit: 2022-03-19 | Discharge: 2022-03-19 | Disposition: A | Discharge status: Home / Self Care | Payer: Medicaid Other | Source: Ambulatory Visit | Attending: Internal Medicine | Admitting: Internal Medicine

## 2022-03-19 DIAGNOSIS — R109 Unspecified abdominal pain: Secondary | ICD-10-CM | POA: Diagnosis not present

## 2022-03-19 DIAGNOSIS — R1084 Generalized abdominal pain: Secondary | ICD-10-CM | POA: Diagnosis present

## 2022-03-19 MED ORDER — IOHEXOL 300 MG/ML  SOLN
100.0000 mL | Freq: Once | INTRAMUSCULAR | Status: AC | PRN
  Administered 2022-03-19: 100 mL via INTRAVENOUS

## 2022-03-25 ENCOUNTER — Encounter: Payer: Self-pay | Admitting: Student

## 2022-05-09 ENCOUNTER — Telehealth: Payer: Medicaid Other | Admitting: Emergency Medicine

## 2022-05-09 DIAGNOSIS — N760 Acute vaginitis: Secondary | ICD-10-CM | POA: Diagnosis not present

## 2022-05-10 MED ORDER — METRONIDAZOLE 500 MG PO TABS: 500.0000 mg | ORAL_TABLET | Freq: Two times a day (BID) | ORAL | 0 refills | Status: DC

## 2022-05-10 NOTE — Progress Notes (Signed)
E-Visit for Vaginal Symptoms  We are sorry that you are not feeling well. Here is how we plan to help! Based on what you shared with me it looks like you: May have a vaginosis due to bacteria  Vaginosis is an inflammation of the vagina that can result in discharge, itching and pain. The cause is usually a change in the normal balance of vaginal bacteria or an infection. Vaginosis can also result from reduced estrogen levels after menopause.  The most common causes of vaginosis are:   Bacterial vaginosis which results from an overgrowth of one on several organisms that are normally present in your vagina.   Yeast infections which are caused by a naturally occurring fungus called candida.   Vaginal atrophy (atrophic vaginosis) which results from the thinning of the vagina from reduced estrogen levels after menopause.   Trichomoniasis which is caused by a parasite and is commonly transmitted by sexual intercourse.  Factors that increase your risk of developing vaginosis include: Medications, such as antibiotics and steroids Uncontrolled diabetes Use of hygiene products such as bubble bath, vaginal spray or vaginal deodorant Douching Wearing damp or tight-fitting clothing Using an intrauterine device (IUD) for birth control Hormonal changes, such as those associated with pregnancy, birth control pills or menopause Sexual activity Having a sexually transmitted infection  Your treatment plan is Metronidazole or Flagyl '500mg'$  twice a day for 7 days.  I have electronically sent this prescription into the pharmacy that you have chosen.  If this treatment doesn't work, you'll need to be seen in person and have STD testing.   Be sure to take all of the medication as directed. Stop taking any medication if you develop a rash, tongue swelling or shortness of breath. Mothers who are breast feeding should consider pumping and discarding their breast milk while on these antibiotics. However, there is  no consensus that infant exposure at these doses would be harmful.  Remember that medication creams can weaken latex condoms. Marland Kitchen   HOME CARE:  Good hygiene may prevent some types of vaginosis from recurring and may relieve some symptoms:  Avoid baths, hot tubs and whirlpool spas. Rinse soap from your outer genital area after a shower, and dry the area well to prevent irritation. Don't use scented or harsh soaps, such as those with deodorant or antibacterial action. Avoid irritants. These include scented tampons and pads. Wipe from front to back after using the toilet. Doing so avoids spreading fecal bacteria to your vagina.  Other things that may help prevent vaginosis include:  Don't douche. Your vagina doesn't require cleansing other than normal bathing. Repetitive douching disrupts the normal organisms that reside in the vagina and can actually increase your risk of vaginal infection. Douching won't clear up a vaginal infection. Use a latex condom. Both female and female latex condoms may help you avoid infections spread by sexual contact. Wear cotton underwear. Also wear pantyhose with a cotton crotch. If you feel comfortable without it, skip wearing underwear to bed. Yeast thrives in [employer] Your symptoms should improve in the next day or two.  GET HELP RIGHT AWAY IF:  You have pain in your lower abdomen ( pelvic area or over your ovaries) You develop nausea or vomiting You develop a fever Your discharge changes or worsens You have persistent pain with intercourse You develop shortness of breath, a rapid pulse, or you faint.  These symptoms could be signs of problems or infections that need to be evaluated by a medical provider  now.  MAKE SURE YOU   Understand these instructions. Will watch your condition. Will get help right away if you are not doing well or get worse.  Thank you for choosing an e-visit.  Your e-visit answers were reviewed by a board certified  advanced clinical practitioner to complete your personal care plan. Depending upon the condition, your plan could have included both over the counter or prescription medications.  Please review your pharmacy choice. Make sure the pharmacy is open so you can pick up prescription now. If there is a problem, you may contact your provider through [employer] and have the prescription routed to another pharmacy.  Your safety is important to [place]. If you have drug allergies check your prescription carefully.   For the next 24 hours you can use MyChart to ask questions about today's visit, request a non-urgent call back, or ask for a work or school excuse. You will get an email in the next two days asking about your experience. I hope that your e-visit has been valuable and will speed your recovery.  Approximately 5 minutes was used in reviewing the patient's chart, questionnaire, prescribing medications, and documentation.

## 2022-07-01 ENCOUNTER — Telehealth: Payer: Medicaid Other | Admitting: Physician Assistant

## 2022-07-01 DIAGNOSIS — B3731 Acute candidiasis of vulva and vagina: Secondary | ICD-10-CM | POA: Diagnosis not present

## 2022-07-01 MED ORDER — FLUCONAZOLE 150 MG PO TABS: ORAL_TABLET | ORAL | 0 refills | Status: DC

## 2022-07-01 NOTE — Progress Notes (Signed)
I have spent 5 minutes in review of e-visit questionnaire, review and updating patient chart, medical decision making and response to patient.   Davy Westmoreland Cody Kierstin January, PA-C    

## 2022-07-01 NOTE — Progress Notes (Signed)

## 2022-07-16 ENCOUNTER — Telehealth: Payer: Medicaid Other | Admitting: Physician Assistant

## 2022-07-16 DIAGNOSIS — N760 Acute vaginitis: Secondary | ICD-10-CM | POA: Diagnosis not present

## 2022-07-16 DIAGNOSIS — B9689 Other specified bacterial agents as the cause of diseases classified elsewhere: Secondary | ICD-10-CM

## 2022-07-16 MED ORDER — METRONIDAZOLE 500 MG PO TABS: 500.0000 mg | ORAL_TABLET | Freq: Two times a day (BID) | ORAL | 0 refills | Status: AC

## 2022-07-16 NOTE — Progress Notes (Signed)
E-Visit for Vaginal Symptoms  We are sorry that you are not feeling well. Here is how we plan to help! Based on what you shared with me it looks like you: May have a vaginosis due to bacteria  Vaginosis is an inflammation of the vagina that can result in discharge, itching and pain. The cause is usually a change in the normal balance of vaginal bacteria or an infection. Vaginosis can also result from reduced estrogen levels after menopause.  The most common causes of vaginosis are:   Bacterial vaginosis which results from an overgrowth of one on several organisms that are normally present in your vagina.   Yeast infections which are caused by a naturally occurring fungus called candida.   Vaginal atrophy (atrophic vaginosis) which results from the thinning of the vagina from reduced estrogen levels after menopause.   Trichomoniasis which is caused by a parasite and is commonly transmitted by sexual intercourse.  Factors that increase your risk of developing vaginosis include: Medications, such as antibiotics and steroids Uncontrolled diabetes Use of hygiene products such as bubble bath, vaginal spray or vaginal deodorant Douching Wearing damp or tight-fitting clothing Using an intrauterine device (IUD) for birth control Hormonal changes, such as those associated with pregnancy, birth control pills or menopause Sexual activity Having a sexually transmitted infection  Your treatment plan is Metronidazole or Flagyl 500mg twice a day for 7 days.  I have electronically sent this prescription into the pharmacy that you have chosen.  Be sure to take all of the medication as directed. Stop taking any medication if you develop a rash, tongue swelling or shortness of breath. Mothers who are breast feeding should consider pumping and discarding their breast milk while on these antibiotics. However, there is no consensus that infant exposure at these doses would be harmful.  Remember that  medication creams can weaken latex condoms. .   HOME CARE:  Good hygiene may prevent some types of vaginosis from recurring and may relieve some symptoms:  Avoid baths, hot tubs and whirlpool spas. Rinse soap from your outer genital area after a shower, and dry the area well to prevent irritation. Don't use scented or harsh soaps, such as those with deodorant or antibacterial action. Avoid irritants. These include scented tampons and pads. Wipe from front to back after using the toilet. Doing so avoids spreading fecal bacteria to your vagina.  Other things that may help prevent vaginosis include:  Don't douche. Your vagina doesn't require cleansing other than normal bathing. Repetitive douching disrupts the normal organisms that reside in the vagina and can actually increase your risk of vaginal infection. Douching won't clear up a vaginal infection. Use a latex condom. Both female and female latex condoms may help you avoid infections spread by sexual contact. Wear cotton underwear. Also wear pantyhose with a cotton crotch. If you feel comfortable without it, skip wearing underwear to bed. Yeast thrives in moist environments Your symptoms should improve in the next day or two.  GET HELP RIGHT AWAY IF:  You have pain in your lower abdomen ( pelvic area or over your ovaries) You develop nausea or vomiting You develop a fever Your discharge changes or worsens You have persistent pain with intercourse You develop shortness of breath, a rapid pulse, or you faint.  These symptoms could be signs of problems or infections that need to be evaluated by a medical provider now.  MAKE SURE YOU   Understand these instructions. Will watch your condition. Will get help right   away if you are not doing well or get worse.  Thank you for choosing an e-visit.  Your e-visit answers were reviewed by a board certified advanced clinical practitioner to complete your personal care plan. Depending upon the  condition, your plan could have included both over the counter or prescription medications.  Please review your pharmacy choice. Make sure the pharmacy is open so you can pick up prescription now. If there is a problem, you may contact your provider through MyChart messaging and have the prescription routed to another pharmacy.  Your safety is important to us. If you have drug allergies check your prescription carefully.   For the next 24 hours you can use MyChart to ask questions about today's visit, request a non-urgent call back, or ask for a work or school excuse. You will get an email in the next two days asking about your experience. I hope that your e-visit has been valuable and will speed your recovery.  I have spent 5 minutes in review of e-visit questionnaire, review and updating patient chart, medical decision making and response to patient.   Ashan Cueva M Chantrice Hagg, PA-C  

## 2022-09-20 DIAGNOSIS — Z113 Encounter for screening for infections with a predominantly sexual mode of transmission: Secondary | ICD-10-CM | POA: Diagnosis not present

## 2022-09-20 DIAGNOSIS — Z3046 Encounter for surveillance of implantable subdermal contraceptive: Secondary | ICD-10-CM | POA: Diagnosis not present

## 2022-09-20 DIAGNOSIS — Z114 Encounter for screening for human immunodeficiency virus [HIV]: Secondary | ICD-10-CM | POA: Diagnosis not present

## 2022-11-03 ENCOUNTER — Telehealth: Payer: Medicaid Other | Admitting: Physician Assistant

## 2022-11-03 DIAGNOSIS — B3731 Acute candidiasis of vulva and vagina: Secondary | ICD-10-CM

## 2022-11-04 MED ORDER — FLUCONAZOLE 150 MG PO TABS: ORAL_TABLET | ORAL | 0 refills | Status: DC

## 2022-11-04 NOTE — Progress Notes (Signed)

## 2022-11-04 NOTE — Progress Notes (Signed)
I have spent 5 minutes in review of e-visit questionnaire, review and updating patient chart, medical decision making and response to patient.   Ivyrose Hashman Cody Destiny Trickey, PA-C    

## 2022-11-08 ENCOUNTER — Telehealth: Payer: Medicaid Other | Admitting: Physician Assistant

## 2022-11-08 DIAGNOSIS — R3989 Other symptoms and signs involving the genitourinary system: Secondary | ICD-10-CM

## 2022-11-08 MED ORDER — CEPHALEXIN 500 MG PO CAPS: 500.0000 mg | ORAL_CAPSULE | Freq: Two times a day (BID) | ORAL | 0 refills | Status: DC

## 2022-11-08 NOTE — Progress Notes (Signed)

## 2022-11-24 DIAGNOSIS — Z708 Other sex counseling: Secondary | ICD-10-CM | POA: Diagnosis not present

## 2022-11-24 DIAGNOSIS — Z113 Encounter for screening for infections with a predominantly sexual mode of transmission: Secondary | ICD-10-CM | POA: Diagnosis not present

## 2022-11-24 DIAGNOSIS — Z789 Other specified health status: Secondary | ICD-10-CM | POA: Diagnosis not present

## 2022-12-14 ENCOUNTER — Telehealth: Payer: Medicaid Other | Admitting: Physician Assistant

## 2022-12-14 DIAGNOSIS — N760 Acute vaginitis: Secondary | ICD-10-CM | POA: Diagnosis not present

## 2022-12-14 DIAGNOSIS — B9689 Other specified bacterial agents as the cause of diseases classified elsewhere: Secondary | ICD-10-CM

## 2022-12-14 MED ORDER — METRONIDAZOLE 500 MG PO TABS: 500.0000 mg | ORAL_TABLET | Freq: Two times a day (BID) | ORAL | 0 refills | Status: AC

## 2022-12-14 NOTE — Progress Notes (Signed)
E-Visit for Vaginal Symptoms  We are sorry that you are not feeling well. Here is how we plan to help! Based on what you shared with me it looks like you: May have a vaginosis due to bacteria  Vaginosis is an inflammation of the vagina that can result in discharge, itching and pain. The cause is usually a change in the normal balance of vaginal bacteria or an infection. Vaginosis can also result from reduced estrogen levels after menopause.  The most common causes of vaginosis are:   Bacterial vaginosis which results from an overgrowth of one on several organisms that are normally present in your vagina.   Yeast infections which are caused by a naturally occurring fungus called candida.   Vaginal atrophy (atrophic vaginosis) which results from the thinning of the vagina from reduced estrogen levels after menopause.   Trichomoniasis which is caused by a parasite and is commonly transmitted by sexual intercourse.  Factors that increase your risk of developing vaginosis include: Medications, such as antibiotics and steroids Uncontrolled diabetes Use of hygiene products such as bubble bath, vaginal spray or vaginal deodorant Douching Wearing damp or tight-fitting clothing Using an intrauterine device (IUD) for birth control Hormonal changes, such as those associated with pregnancy, birth control pills or menopause Sexual activity Having a sexually transmitted infection  Your treatment plan is Metronidazole or Flagyl 500mg twice a day for 7 days.  I have electronically sent this prescription into the pharmacy that you have chosen.  Be sure to take all of the medication as directed. Stop taking any medication if you develop a rash, tongue swelling or shortness of breath. Mothers who are breast feeding should consider pumping and discarding their breast milk while on these antibiotics. However, there is no consensus that infant exposure at these doses would be harmful.  Remember that  medication creams can weaken latex condoms. .   HOME CARE:  Good hygiene may prevent some types of vaginosis from recurring and may relieve some symptoms:  Avoid baths, hot tubs and whirlpool spas. Rinse soap from your outer genital area after a shower, and dry the area well to prevent irritation. Don't use scented or harsh soaps, such as those with deodorant or antibacterial action. Avoid irritants. These include scented tampons and pads. Wipe from front to back after using the toilet. Doing so avoids spreading fecal bacteria to your vagina.  Other things that may help prevent vaginosis include:  Don't douche. Your vagina doesn't require cleansing other than normal bathing. Repetitive douching disrupts the normal organisms that reside in the vagina and can actually increase your risk of vaginal infection. Douching won't clear up a vaginal infection. Use a latex condom. Both female and female latex condoms may help you avoid infections spread by sexual contact. Wear cotton underwear. Also wear pantyhose with a cotton crotch. If you feel comfortable without it, skip wearing underwear to bed. Yeast thrives in moist environments Your symptoms should improve in the next day or two.  GET HELP RIGHT AWAY IF:  You have pain in your lower abdomen ( pelvic area or over your ovaries) You develop nausea or vomiting You develop a fever Your discharge changes or worsens You have persistent pain with intercourse You develop shortness of breath, a rapid pulse, or you faint.  These symptoms could be signs of problems or infections that need to be evaluated by a medical provider now.  MAKE SURE YOU   Understand these instructions. Will watch your condition. Will get help right   away if you are not doing well or get worse.  Thank you for choosing an e-visit.  Your e-visit answers were reviewed by a board certified advanced clinical practitioner to complete your personal care plan. Depending upon the  condition, your plan could have included both over the counter or prescription medications.  Please review your pharmacy choice. Make sure the pharmacy is open so you can pick up prescription now. If there is a problem, you may contact your provider through MyChart messaging and have the prescription routed to another pharmacy.  Your safety is important to us. If you have drug allergies check your prescription carefully.   For the next 24 hours you can use MyChart to ask questions about today's visit, request a non-urgent call back, or ask for a work or school excuse. You will get an email in the next two days asking about your experience. I hope that your e-visit has been valuable and will speed your recovery.   I have spent 5 minutes in review of e-visit questionnaire, review and updating patient chart, medical decision making and response to patient.   Emina Ribaudo Z Ward, PA-C    

## 2022-12-21 DIAGNOSIS — N898 Other specified noninflammatory disorders of vagina: Secondary | ICD-10-CM | POA: Diagnosis not present

## 2022-12-27 ENCOUNTER — Telehealth: Payer: Medicaid Other | Admitting: Physician Assistant

## 2022-12-27 DIAGNOSIS — B379 Candidiasis, unspecified: Secondary | ICD-10-CM | POA: Diagnosis not present

## 2022-12-27 DIAGNOSIS — T3695XA Adverse effect of unspecified systemic antibiotic, initial encounter: Secondary | ICD-10-CM | POA: Diagnosis not present

## 2022-12-28 MED ORDER — FLUCONAZOLE 150 MG PO TABS
150.0000 mg | ORAL_TABLET | Freq: Every day | ORAL | 0 refills | Status: DC
Start: 2022-12-28 — End: 2023-01-19

## 2022-12-28 NOTE — Progress Notes (Signed)

## 2022-12-28 NOTE — Progress Notes (Signed)
I have spent 5 minutes in review of e-visit questionnaire, review and updating patient chart, medical decision making and response to patient.   Amariyon Maynes Cody Jadae Steinke, PA-C    

## 2023-01-19 ENCOUNTER — Telehealth: Payer: Medicaid Other | Admitting: Physician Assistant

## 2023-01-19 DIAGNOSIS — B3731 Acute candidiasis of vulva and vagina: Secondary | ICD-10-CM | POA: Diagnosis not present

## 2023-01-19 MED ORDER — FLUCONAZOLE 150 MG PO TABS
150.0000 mg | ORAL_TABLET | ORAL | 0 refills | Status: AC | PRN
Start: 2023-01-19 — End: ?

## 2023-01-19 NOTE — Progress Notes (Signed)

## 2023-02-22 DIAGNOSIS — Z30011 Encounter for initial prescription of contraceptive pills: Secondary | ICD-10-CM | POA: Diagnosis not present

## 2023-02-22 DIAGNOSIS — Z3202 Encounter for pregnancy test, result negative: Secondary | ICD-10-CM | POA: Diagnosis not present

## 2023-04-26 ENCOUNTER — Telehealth: Payer: Self-pay | Admitting: Physician Assistant

## 2023-04-26 DIAGNOSIS — B3731 Acute candidiasis of vulva and vagina: Secondary | ICD-10-CM

## 2023-04-26 MED ORDER — FLUCONAZOLE 150 MG PO TABS: 150.0000 mg | ORAL_TABLET | ORAL | 0 refills | Status: DC | PRN

## 2023-04-26 NOTE — Progress Notes (Signed)
I have spent 5 minutes in review of e-visit questionnaire, review and updating patient chart, medical decision making and response to patient.   Mia Milan Cody Jacklynn Dehaas, PA-C    

## 2023-04-26 NOTE — Progress Notes (Signed)

## 2023-05-18 ENCOUNTER — Telehealth: Payer: Self-pay | Admitting: Physician Assistant

## 2023-05-18 DIAGNOSIS — N760 Acute vaginitis: Secondary | ICD-10-CM

## 2023-05-18 DIAGNOSIS — B9689 Other specified bacterial agents as the cause of diseases classified elsewhere: Secondary | ICD-10-CM

## 2023-05-18 MED ORDER — METRONIDAZOLE 500 MG PO TABS
500.0000 mg | ORAL_TABLET | Freq: Two times a day (BID) | ORAL | 0 refills | Status: AC
Start: 2023-05-18 — End: 2023-05-25

## 2023-05-18 NOTE — Progress Notes (Signed)
E-Visit for Vaginal Symptoms  We are sorry that you are not feeling well. Here is how we plan to help! Based on what you shared with me it looks like you: May have a vaginosis due to bacteria  Vaginosis is an inflammation of the vagina that can result in discharge, itching and pain. The cause is usually a change in the normal balance of vaginal bacteria or an infection. Vaginosis can also result from reduced estrogen levels after menopause.  The most common causes of vaginosis are:   Bacterial vaginosis which results from an overgrowth of one on several organisms that are normally present in your vagina.   Yeast infections which are caused by a naturally occurring fungus called candida.   Vaginal atrophy (atrophic vaginosis) which results from the thinning of the vagina from reduced estrogen levels after menopause.   Trichomoniasis which is caused by a parasite and is commonly transmitted by sexual intercourse.  Factors that increase your risk of developing vaginosis include: Medications, such as antibiotics and steroids Uncontrolled diabetes Use of hygiene products such as bubble bath, vaginal spray or vaginal deodorant Douching Wearing damp or tight-fitting clothing Using an intrauterine device (IUD) for birth control Hormonal changes, such as those associated with pregnancy, birth control pills or menopause Sexual activity Having a sexually transmitted infection  Your treatment plan is Metronidazole or Flagyl 500mg twice a day for 7 days.  I have electronically sent this prescription into the pharmacy that you have chosen.  Be sure to take all of the medication as directed. Stop taking any medication if you develop a rash, tongue swelling or shortness of breath. Mothers who are breast feeding should consider pumping and discarding their breast milk while on these antibiotics. However, there is no consensus that infant exposure at these doses would be harmful.  Remember that  medication creams can weaken latex condoms. .   HOME CARE:  Good hygiene may prevent some types of vaginosis from recurring and may relieve some symptoms:  Avoid baths, hot tubs and whirlpool spas. Rinse soap from your outer genital area after a shower, and dry the area well to prevent irritation. Don't use scented or harsh soaps, such as those with deodorant or antibacterial action. Avoid irritants. These include scented tampons and pads. Wipe from front to back after using the toilet. Doing so avoids spreading fecal bacteria to your vagina.  Other things that may help prevent vaginosis include:  Don't douche. Your vagina doesn't require cleansing other than normal bathing. Repetitive douching disrupts the normal organisms that reside in the vagina and can actually increase your risk of vaginal infection. Douching won't clear up a vaginal infection. Use a latex condom. Both female and female latex condoms may help you avoid infections spread by sexual contact. Wear cotton underwear. Also wear pantyhose with a cotton crotch. If you feel comfortable without it, skip wearing underwear to bed. Yeast thrives in moist environments Your symptoms should improve in the next day or two.  GET HELP RIGHT AWAY IF:  You have pain in your lower abdomen ( pelvic area or over your ovaries) You develop nausea or vomiting You develop a fever Your discharge changes or worsens You have persistent pain with intercourse You develop shortness of breath, a rapid pulse, or you faint.  These symptoms could be signs of problems or infections that need to be evaluated by a medical provider now.  MAKE SURE YOU   Understand these instructions. Will watch your condition. Will get help right   away if you are not doing well or get worse.  Thank you for choosing an e-visit.  Your e-visit answers were reviewed by a board certified advanced clinical practitioner to complete your personal care plan. Depending upon the  condition, your plan could have included both over the counter or prescription medications.  Please review your pharmacy choice. Make sure the pharmacy is open so you can pick up prescription now. If there is a problem, you may contact your provider through MyChart messaging and have the prescription routed to another pharmacy.  Your safety is important to us. If you have drug allergies check your prescription carefully.   For the next 24 hours you can use MyChart to ask questions about today's visit, request a non-urgent call back, or ask for a work or school excuse. You will get an email in the next two days asking about your experience. I hope that your e-visit has been valuable and will speed your recovery.  I have spent 5 minutes in review of e-visit questionnaire, review and updating patient chart, medical decision making and response to patient.   Siddalee Vanderheiden M Lujean Ebright, PA-C  

## 2023-06-14 IMAGING — CT CT ABD-PELV W/O CM
1 of 2 series · 14 of 32 positions shown, 19 images · non-contrast
Comparison: None

CLINICAL DATA: Umbilical hernia, history of hernia repair, smoker

EXAM:
CT ABDOMEN AND PELVIS WITHOUT CONTRAST
TECHNIQUE: Multidetector CT imaging of the abdomen and pelvis was performed
following the standard protocol without IV contrast. Sagittal and
coronal MPR images reconstructed from axial data set. Patient drank
dilute oral contrast for exam

[Series 2: abd/pelvis w/(date) · axial · 0.69mm/px · z∈[-423,-38]mm · 14 of 87 slices shown, 19 images]
[im 5/87  soft-tissue]
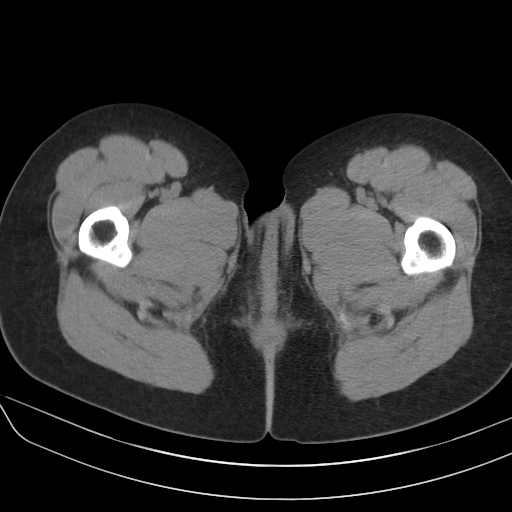
[im 5/87  bone]
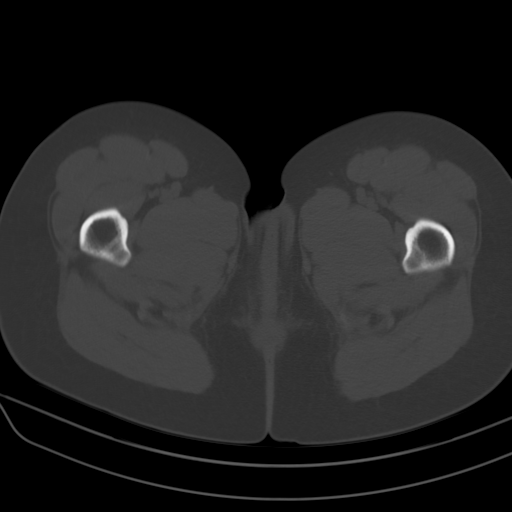
[im 14/87  soft-tissue]
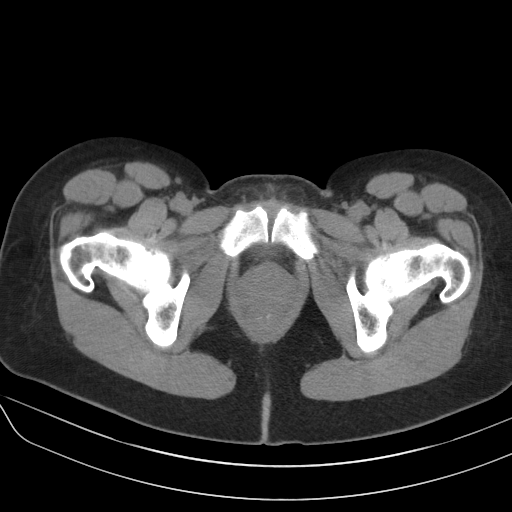
[im 19/87  soft-tissue]
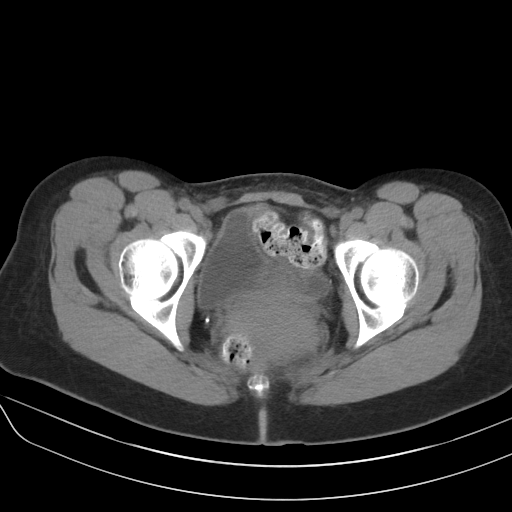
[im 23/87  soft-tissue]
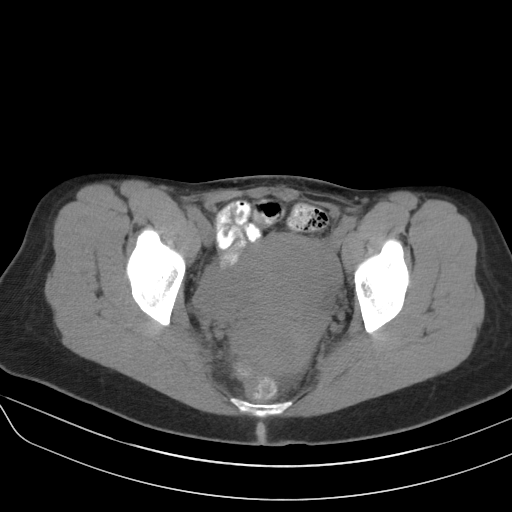
[im 32/87  soft-tissue]
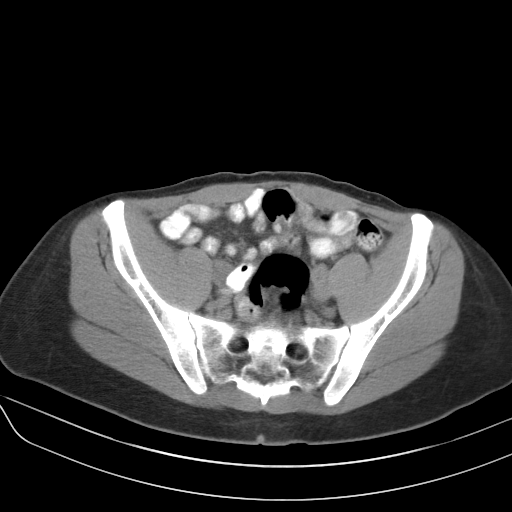
[im 37/87  soft-tissue]
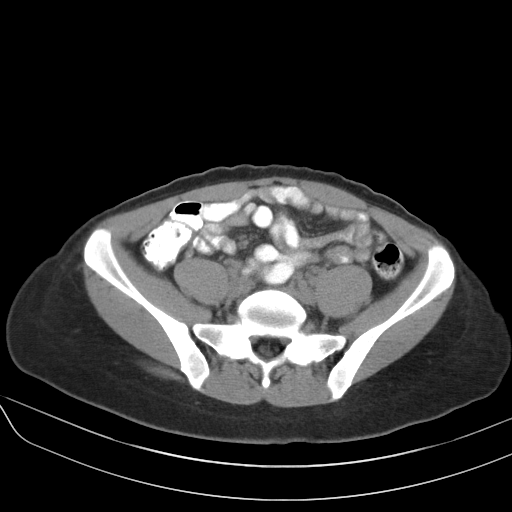
[im 46/87  soft-tissue]
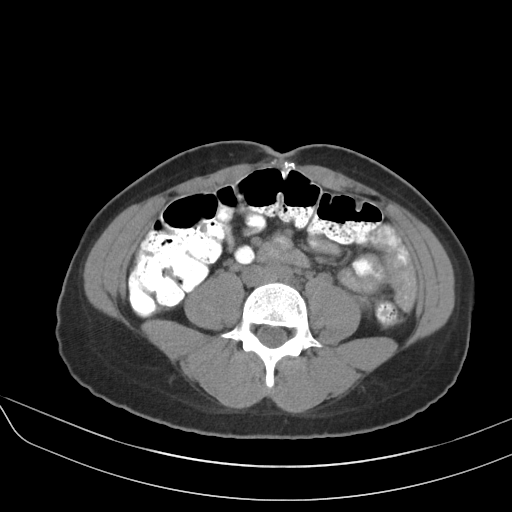
[im 50/87  soft-tissue]
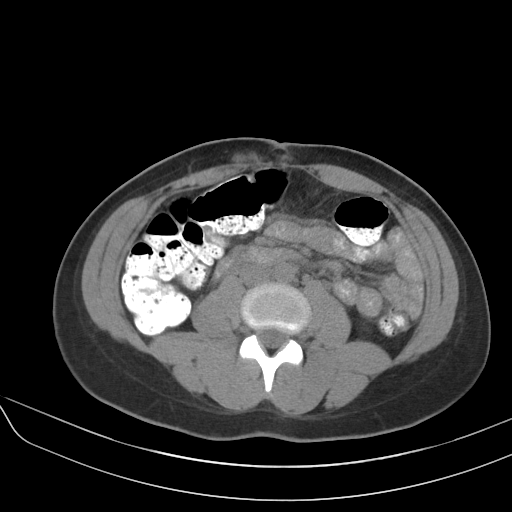
[im 55/87  soft-tissue]
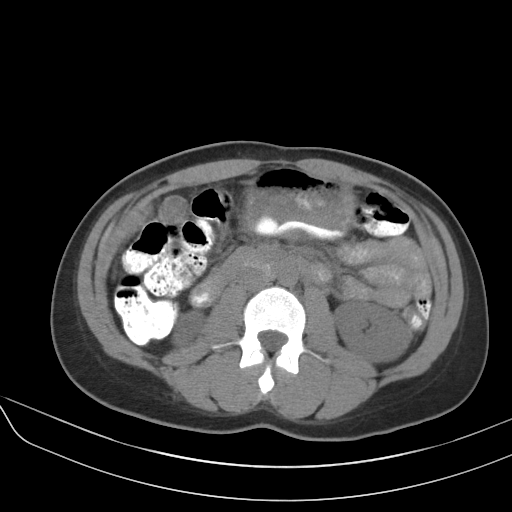
[im 55/87  bone]
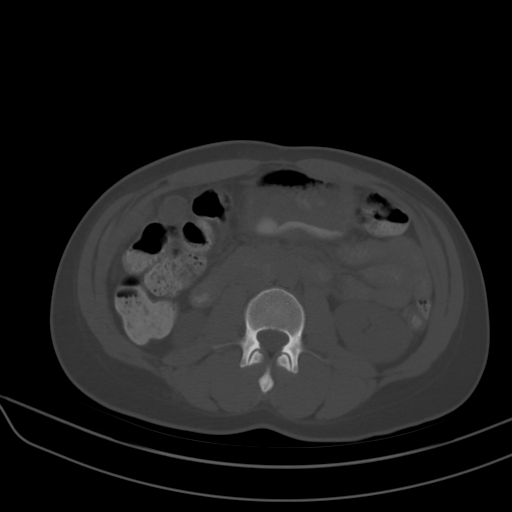
[im 64/87  soft-tissue]
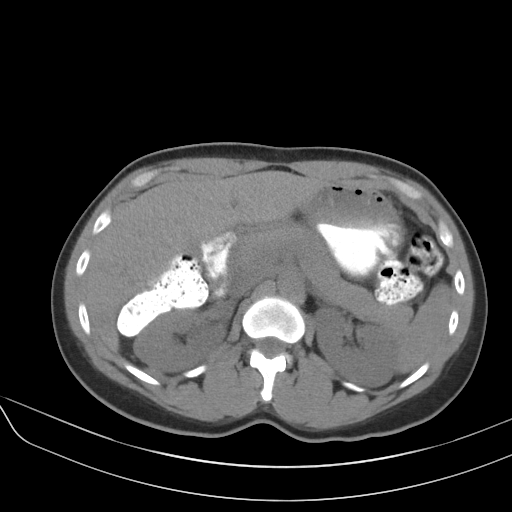
[im 68/87  soft-tissue]
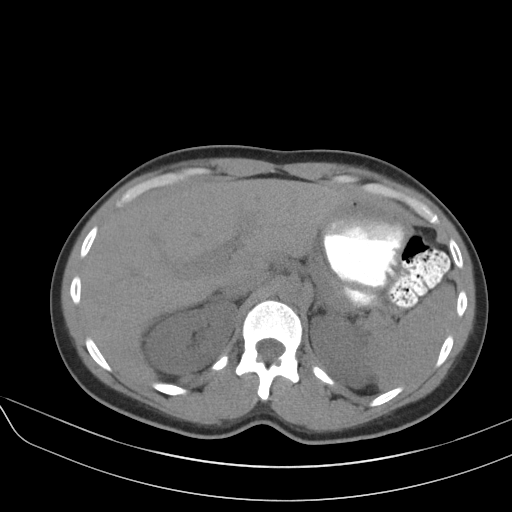
[im 68/87  lung]
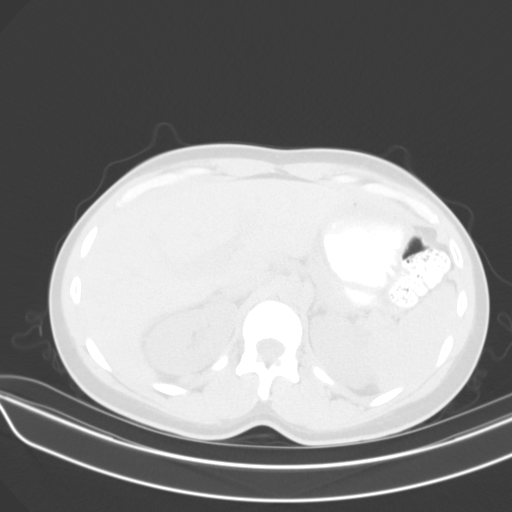
[im 73/87  soft-tissue]
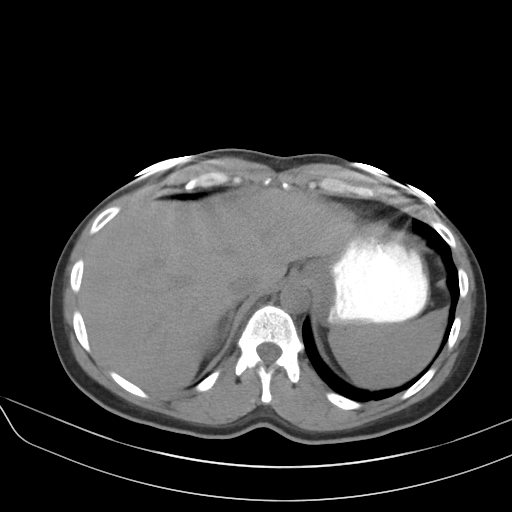
[im 73/87  lung]
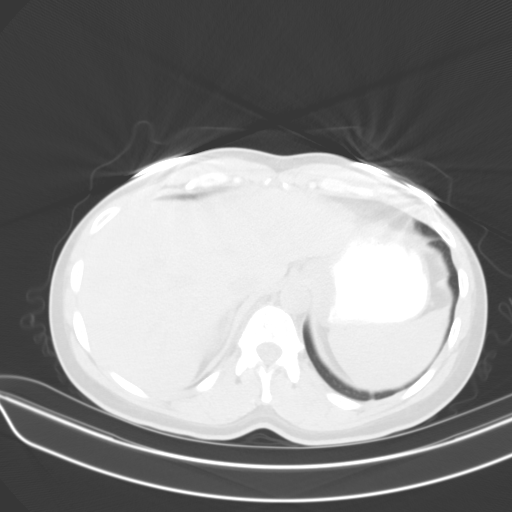
[im 77/87  lung]
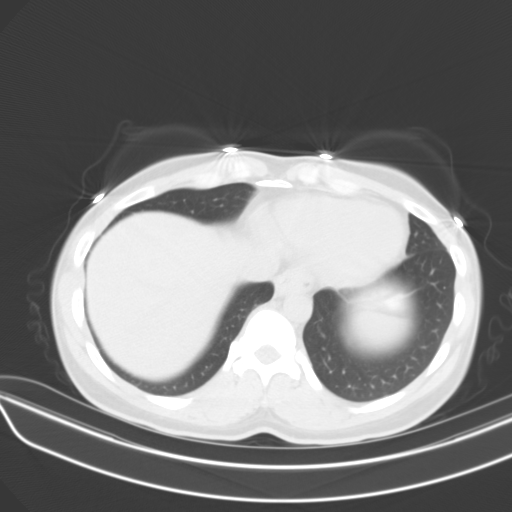
[im 82/87  soft-tissue]
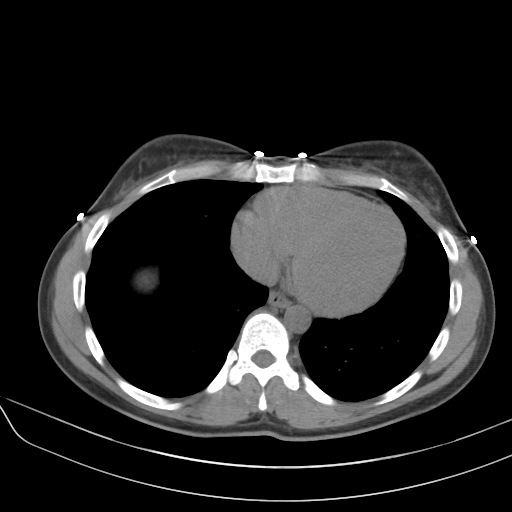
[im 82/87  lung]
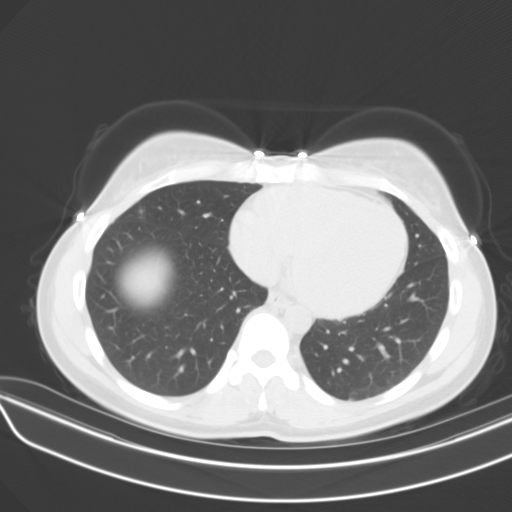

[14 of 32 positions shown; findings below may reference images not displayed]

FINDINGS: Lower chest: Lung bases clear

Hepatobiliary: Gallbladder and liver normal appearance

Pancreas: Normal appearance

Spleen: Normal appearance.  Small splenule inferior to spleen.

Adrenals/Urinary Tract: Adrenal glands, kidneys, ureters, and
bladder normal appearance

Stomach/Bowel: Normal appendix. Stomach and bowel loops normal
appearance

Vascular/Lymphatic: Aorta normal caliber.  No adenopathy.

Reproductive: Unremarkable uterus and adnexa

Other: Prior umbilical hernia repair. Tiny RIGHT paraumbilical
hernia containing fat and minimal fluid, fascial defect 4 mm
diameter on sagittal images. Question additional tiny supraumbilical
hernia containing fat. No free air. Small amount of nonspecific free
fluid in cul-de-sac.

Musculoskeletal: No acute osseous findings.
IMPRESSION: Prior umbilical hernia repair.

Tiny RIGHT paraumbilical hernia containing fat and minimal fluid,
neck 4 mm diameter.

Question additional tiny supraumbilical hernia containing fat.

Small amount of nonspecific free fluid in cul-de-sac.

No other intra-abdominal or intrapelvic abnormalities identified.

## 2023-07-10 ENCOUNTER — Telehealth: Payer: Self-pay | Admitting: Family

## 2023-07-10 DIAGNOSIS — R399 Unspecified symptoms and signs involving the genitourinary system: Secondary | ICD-10-CM

## 2023-07-10 MED ORDER — CEPHALEXIN 500 MG PO CAPS: 500.0000 mg | ORAL_CAPSULE | Freq: Two times a day (BID) | ORAL | 0 refills | Status: DC

## 2023-07-10 NOTE — Progress Notes (Signed)
E-Visit for Urinary Problems  We are sorry that you are not feeling well.  Here is how we plan to help!  Based on what you shared with me it looks like you most likely have a simple urinary tract infection.  A UTI (Urinary Tract Infection) is a bacterial infection of the bladder.  Most cases of urinary tract infections are simple to treat but a key part of your care is to encourage you to drink plenty of fluids and watch your symptoms carefully.  I have prescribed Keflex 500 mg twice a day for 7 days.  Your symptoms should gradually improve. Call us if the burning in your urine worsens, you develop worsening fever, back pain or pelvic pain or if your symptoms do not resolve after completing the antibiotic.  Urinary tract infections can be prevented by drinking plenty of water to keep your body hydrated.  Also be sure when you wipe, wipe from front to back and don't hold it in!  If possible, empty your bladder every 4 hours.  HOME CARE Drink plenty of fluids Compete the full course of the antibiotics even if the symptoms resolve Remember, when you need to go.go. Holding in your urine can increase the likelihood of getting a UTI! GET HELP RIGHT AWAY IF: You cannot urinate You get a high fever Worsening back pain occurs You see blood in your urine You feel sick to your stomach or throw up You feel like you are going to pass out  MAKE SURE YOU  Understand these instructions. Will watch your condition. Will get help right away if you are not doing well or get worse.   Thank you for choosing an e-visit.  Your e-visit answers were reviewed by a board certified advanced clinical practitioner to complete your personal care plan. Depending upon the condition, your plan could have included both over the counter or prescription medications.  Please review your pharmacy choice. Make sure the pharmacy is open so you can pick up prescription now. If there is a problem, you may contact your  provider through MyChart messaging and have the prescription routed to another pharmacy.  Your safety is important to us. If you have drug allergies check your prescription carefully.   For the next 24 hours you can use MyChart to ask questions about today's visit, request a non-urgent call back, or ask for a work or school excuse. You will get an email in the next two days asking about your experience. I hope that your e-visit has been valuable and will speed your recovery.  Approximately 5 minutes was spent documenting and reviewing patient's chart.    

## 2023-09-19 IMAGING — CT CT ABD-PELV W/ CM
1 of 2 series · 14 of 32 positions shown, 19 images · IV contrast (APPLIED)
Comparison: CT February 21, 2021

CLINICAL DATA: Abdominal swelling post hernia repair March 2021.

EXAM:
CT ABDOMEN AND PELVIS WITH CONTRAST
TECHNIQUE: Multidetector CT imaging of the abdomen and pelvis was performed
using the standard protocol following bolus administration of
intravenous contrast.
CONTRAST:  100mL BIVXSI-VTT IOPAMIDOL (BIVXSI-VTT) INJECTION 61%

[Series 2: abd/pelvis w/cm · axial · 0.64mm/px · z∈[-431,-81]mm · 14 of 80 slices shown, 19 images]
[im 5/80  soft-tissue]
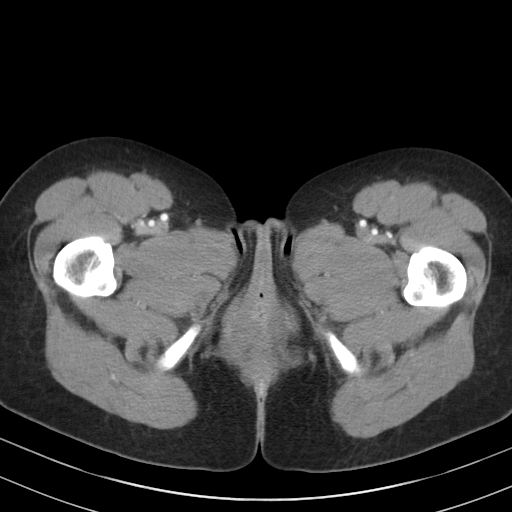
[im 5/80  bone]
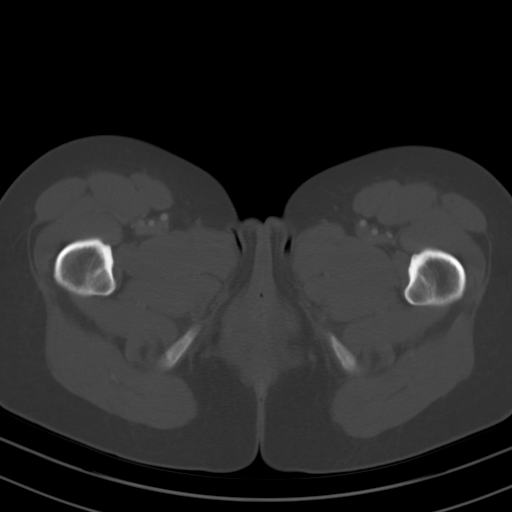
[im 10/80  soft-tissue]
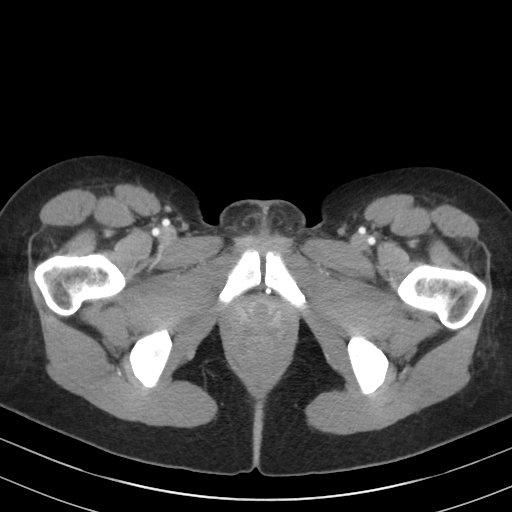
[im 19/80  soft-tissue]
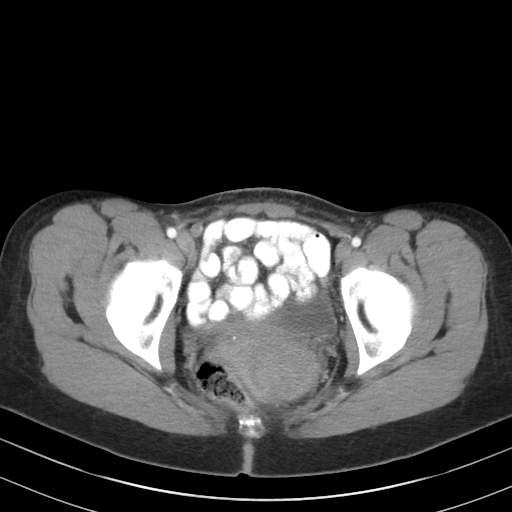
[im 24/80  soft-tissue]
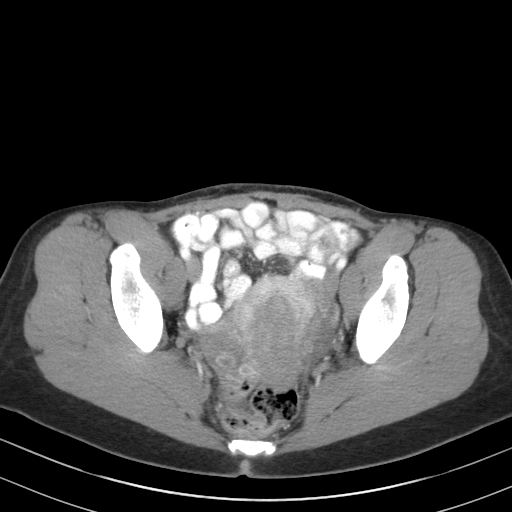
[im 28/80  soft-tissue]
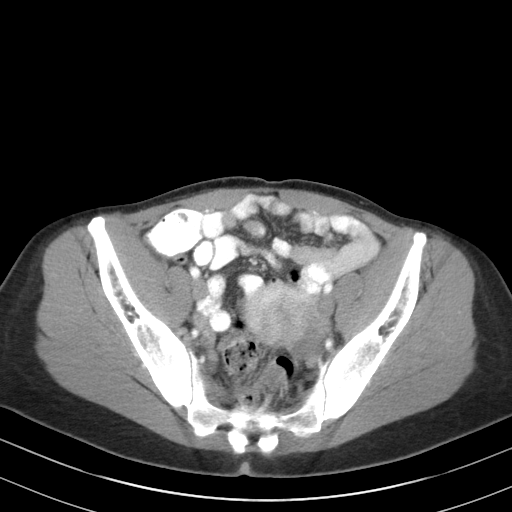
[im 33/80  soft-tissue]
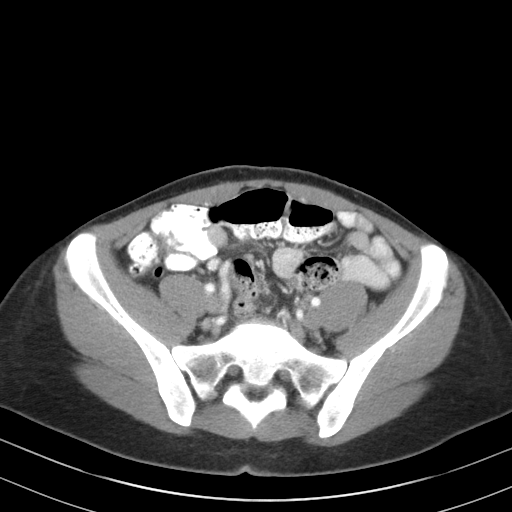
[im 42/80  soft-tissue]
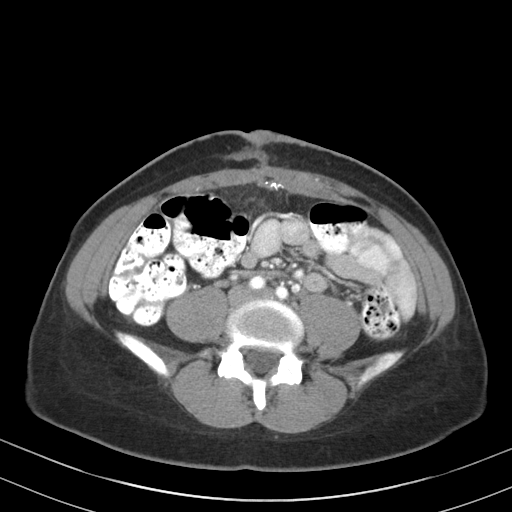
[im 47/80  soft-tissue]
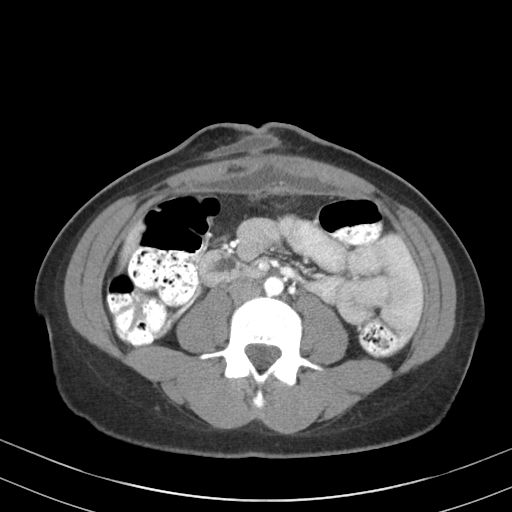
[im 52/80  soft-tissue]
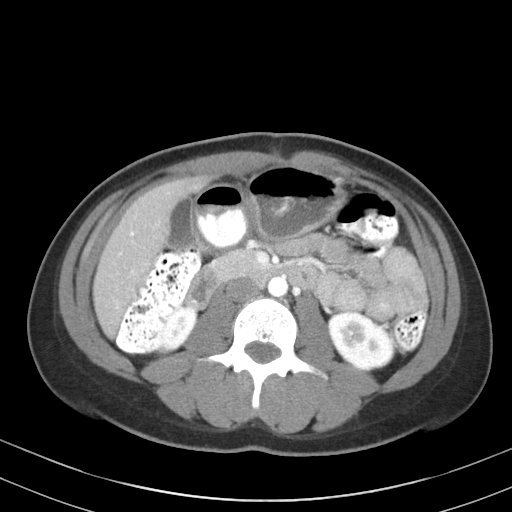
[im 52/80  bone]
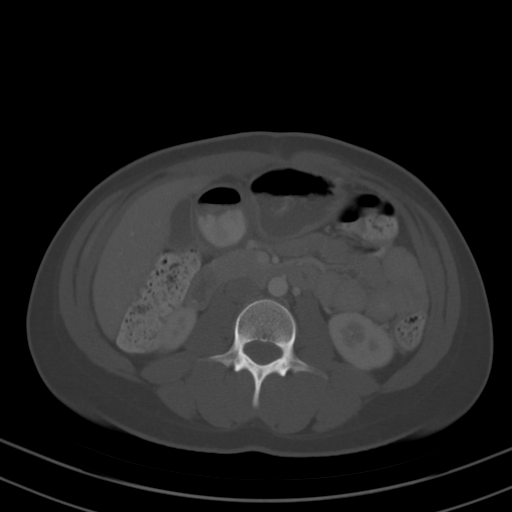
[im 56/80  soft-tissue]
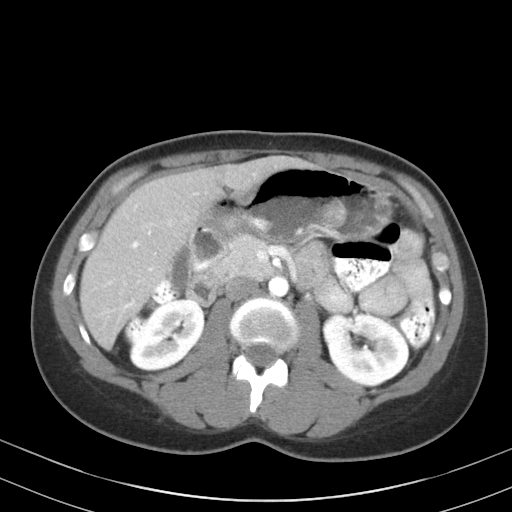
[im 61/80  soft-tissue]
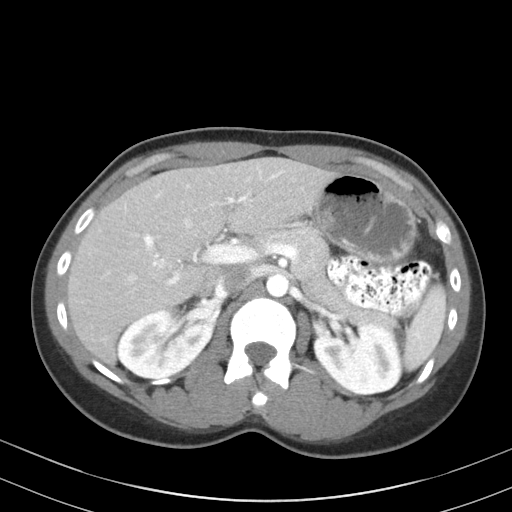
[im 61/80  lung]
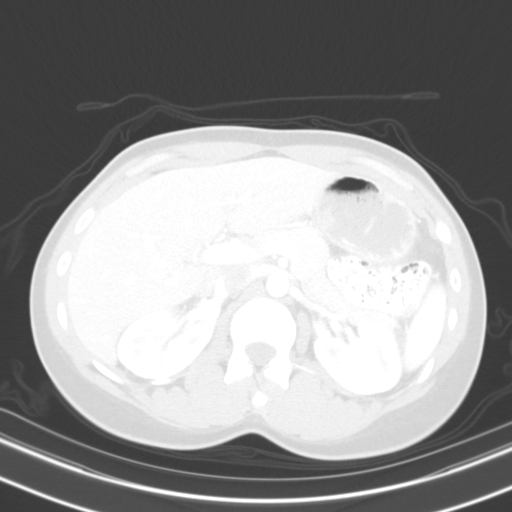
[im 66/80  lung]
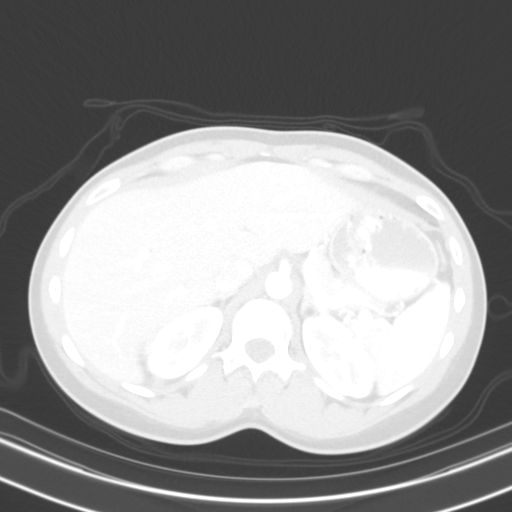
[im 70/80  soft-tissue]
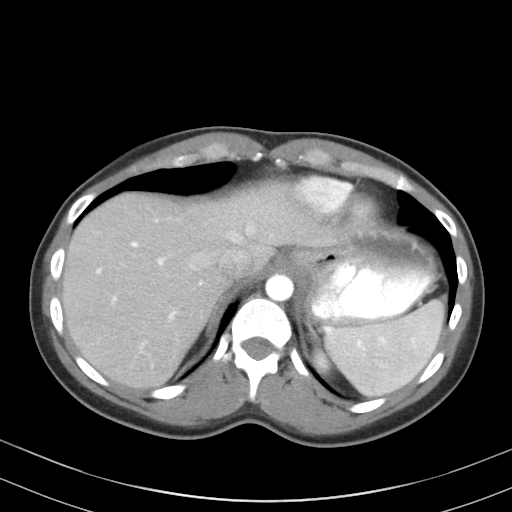
[im 70/80  lung]
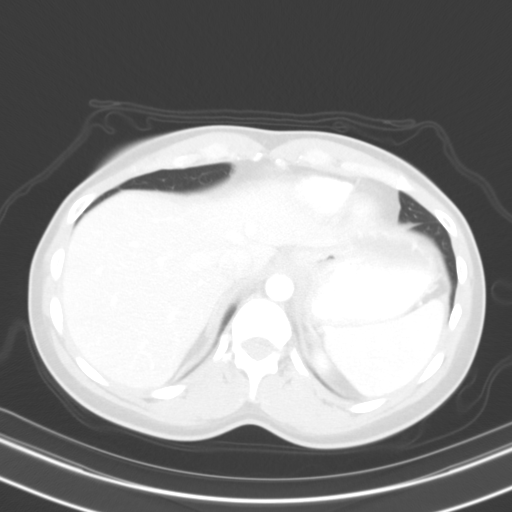
[im 75/80  soft-tissue]
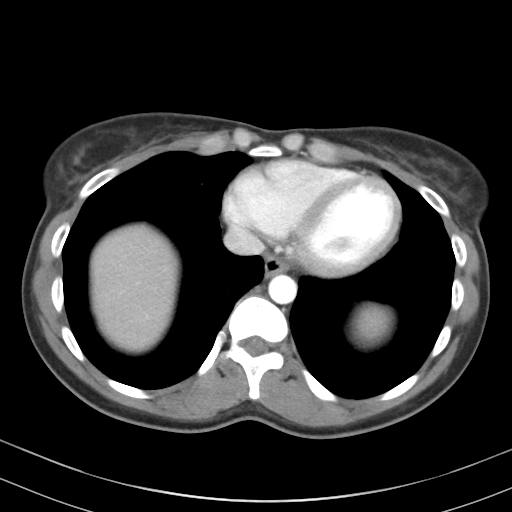
[im 75/80  lung]
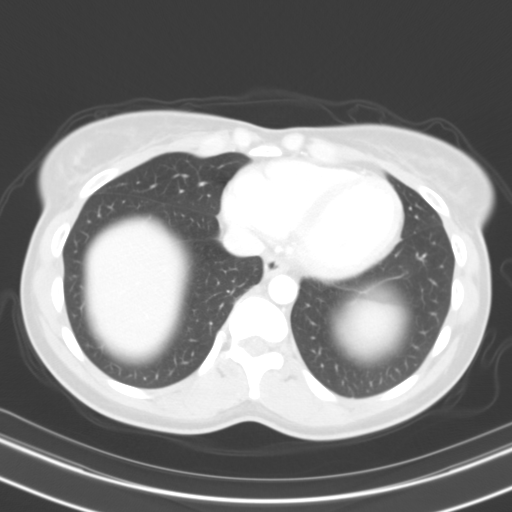

[14 of 32 positions shown; findings below may reference images not displayed]

FINDINGS: Lower chest: No acute abnormality.

Hepatobiliary: No suspicious hepatic lesion. Gallbladder is
unremarkable. No biliary ductal dilation.

Pancreas: No pancreatic ductal dilation or evidence of acute
inflammation.

Spleen: Normal in size without focal abnormality.

Adrenals/Urinary Tract: Adrenal glands are unremarkable. Kidneys are
normal, without renal calculi, solid enhancing lesion, or
hydronephrosis. Bladder is unremarkable for degree of distension.

Stomach/Bowel: Radiopaque enteric contrast traverses the descending
colon. Stomach is unremarkable. No pathologic dilation of small or
large bowel. The appendix and terminal ileum appear normal. Moderate
volume of formed stool throughout the colon. No evidence of acute
bowel inflammation.

Vascular/Lymphatic: No abdominal aortic aneurysm. No pathologically
enlarged abdominal or pelvic lymph nodes.

Reproductive: Uterus and bilateral adnexa are unremarkable for a
premenopausal female.

Other: Surgical change of anterior abdominal wall hernia repair.
Fluid collection along the hernia repair mesh measuring 10.1 x
cm and extending into the anterior abdominal wall with a smaller
fluid collection measuring 3.7 x 0.8 cm. No pneumoperitoneum.

Musculoskeletal: No acute or significant osseous findings.
IMPRESSION: 1. Surgical change of anterior abdominal wall hernia repair, with
fluid collection along the hernia repair mesh measuring 10.1 x
cm and extending into the anterior abdominal wall with a smaller
fluid collection measuring 3.7 x 0.8 cm. Findings may represent a
postoperative seroma or hematoma, given patient's provided clinical
presentation.
2. Moderate volume of formed stool throughout the colon.

## 2023-10-06 ENCOUNTER — Encounter (HOSPITAL_COMMUNITY): Payer: Self-pay | Admitting: *Deleted

## 2023-10-06 ENCOUNTER — Inpatient Hospital Stay (HOSPITAL_COMMUNITY)

## 2023-10-06 ENCOUNTER — Inpatient Hospital Stay (HOSPITAL_COMMUNITY)
Admission: AD | Admit: 2023-10-06 | Discharge: 2023-10-06 | Disposition: A | Discharge status: Home / Self Care | Attending: Obstetrics and Gynecology | Admitting: Obstetrics and Gynecology

## 2023-10-06 DIAGNOSIS — O09511 Supervision of elderly primigravida, first trimester: Secondary | ICD-10-CM | POA: Diagnosis not present

## 2023-10-06 DIAGNOSIS — Z3A01 Less than 8 weeks gestation of pregnancy: Secondary | ICD-10-CM | POA: Diagnosis not present

## 2023-10-06 DIAGNOSIS — R109 Unspecified abdominal pain: Secondary | ICD-10-CM | POA: Diagnosis not present

## 2023-10-06 DIAGNOSIS — O26891 Other specified pregnancy related conditions, first trimester: Secondary | ICD-10-CM

## 2023-10-06 DIAGNOSIS — Z3A00 Weeks of gestation of pregnancy not specified: Secondary | ICD-10-CM | POA: Diagnosis not present

## 2023-10-06 DIAGNOSIS — O26899 Other specified pregnancy related conditions, unspecified trimester: Secondary | ICD-10-CM

## 2023-10-06 DIAGNOSIS — O3680X0 Pregnancy with inconclusive fetal viability, not applicable or unspecified: Secondary | ICD-10-CM | POA: Diagnosis not present

## 2023-10-06 DIAGNOSIS — R7989 Other specified abnormal findings of blood chemistry: Secondary | ICD-10-CM | POA: Diagnosis not present

## 2023-10-06 DIAGNOSIS — O009 Unspecified ectopic pregnancy without intrauterine pregnancy: Secondary | ICD-10-CM | POA: Insufficient documentation

## 2023-10-06 DIAGNOSIS — O209 Hemorrhage in early pregnancy, unspecified: Secondary | ICD-10-CM | POA: Diagnosis not present

## 2023-10-06 HISTORY — DX: Benign neoplasm of connective and other soft tissue, unspecified: D21.9

## 2023-10-06 HISTORY — DX: Urinary tract infection, site not specified: N39.0

## 2023-10-06 LAB — CBC
HCT: 36.9 % (ref 36.0–46.0)
Hemoglobin: 11.8 g/dL — ABNORMAL LOW (ref 12.0–15.0)
MCH: 26.9 pg (ref 26.0–34.0)
MCHC: 32 g/dL (ref 30.0–36.0)
MCV: 84.1 fL (ref 80.0–100.0)
Platelets: 270 10*3/uL (ref 150–400)
RBC: 4.39 MIL/uL (ref 3.87–5.11)
RDW: 12.1 % (ref 11.5–15.5)
WBC: 6.1 10*3/uL (ref 4.0–10.5)
nRBC: 0 % (ref 0.0–0.2)

## 2023-10-06 LAB — COMPREHENSIVE METABOLIC PANEL
ALT: 13 U/L (ref 0–44)
AST: 17 U/L (ref 15–41)
Albumin: 3.5 g/dL (ref 3.5–5.0)
Alkaline Phosphatase: 42 U/L (ref 38–126)
Anion gap: 8 (ref 5–15)
BUN: 8 mg/dL (ref 6–20)
CO2: 26 mmol/L (ref 22–32)
Calcium: 9.2 mg/dL (ref 8.9–10.3)
Chloride: 105 mmol/L (ref 98–111)
Creatinine, Ser: 0.85 mg/dL (ref 0.44–1.00)
GFR, Estimated: >60 mL/min (ref >60)
Glucose, Bld: 86 mg/dL (ref 70–99)
Potassium: 4 mmol/L (ref 3.5–5.1)
Sodium: 139 mmol/L (ref 135–145)
Total Bilirubin: 0.2 mg/dL (ref 0.0–1.2)
Total Protein: 6.9 g/dL (ref 6.5–8.1)

## 2023-10-06 LAB — WET PREP, GENITAL
Sperm: NONE SEEN
Trich, Wet Prep: NONE SEEN
WBC, Wet Prep HPF POC: >=10 — AB (ref <10)
Yeast Wet Prep HPF POC: NONE SEEN

## 2023-10-06 LAB — HCG, QUANTITATIVE, PREGNANCY: hCG, Beta Chain, Quant, S: 241 m[IU]/mL — ABNORMAL HIGH (ref <5)

## 2023-10-06 NOTE — [facility] Note (Signed)
 Taylor Bowen is a 38 y.o. at Unknown here in [facility] reporting: is about 8 wks preg. Just started bleeding and passed a blood clot. Kind of like period- saw it when she peed and wiped. Cramping like cycle was coming on, more on the left side. Preg confirmed at [facility], able to access through my chart. LMP: 1/26 Onset of complaint: today Pain score: mild Vitals:   10/06/23 1446  BP: 127/71  Pulse: 83  Resp: 17  Temp: 98.4 F (36.9 C)  SpO2: 100%      Lab orders placed from triage:

## 2023-10-06 NOTE — MAU Provider Note (Signed)
 Event Date/Time   First Provider Initiated Contact with Patient 10/06/23 1508      S [name] is a 38 y.o. [phone] patient who presents to MAU today with complaint of vaginal bleeding today and she reported passing a blood clot . Her pregnancy was confirmed by [facility] with LMP 08/21/23.~ [redacted]w[redacted]d .  She reported today that she has slight lower quadrant pain but denies taking any medication for it. She reports the pain is "mild similar to menstrual cramps" Denies fever, chills, N/V and denies any active VB  She also reported H/O a myomectomy in 2018 '"size of a cantaloupe" per patient report   Review of Systems  All other systems reviewed and are negative.  Unless otherwise documented in HPI O BP 127/71 (BP Location: Right Arm)   Pulse 83   Temp 98.4 F (36.9 C) (Oral)   Resp 17   Ht 5\' 6"  (1.676 m)   Wt 78.4 kg   LMP 08/21/2023   SpO2 100%   BMI 27.91 kg/m  Physical Exam Vitals and nursing note reviewed.  Constitutional:      General: She is not in acute distress.    Appearance: She is not ill-appearing.  HENT:     Head: Normocephalic.  Cardiovascular:     Rate and Rhythm: Normal rate and regular rhythm.  Pulmonary:     Effort: Pulmonary effort is normal.     Breath sounds: Normal breath sounds.  Abdominal:     General: Abdomen is flat. Bowel sounds are normal.     Palpations: Abdomen is soft.     Tenderness: There is abdominal tenderness in the left lower quadrant. There is no right CVA tenderness, left CVA tenderness, guarding or rebound. Negative signs include Murphy's sign.     Comments: Mild tenderness in LLQ on light palpation  Neurological:     Mental Status: She is alert and oriented to person, place, and time.  Psychiatric:        Mood and Affect: Mood normal.        Behavior: Behavior normal.     MDM  HIGH  VSS Labs: awaiting CMP  OB Ultrasound: Concern for left ectopic pregnancy ( Dr Berton Lan called at 1730) who personally reviewed   the ultrasound images and final radiology report No IUP visualized at this time  Wet Prep : c/w BV   Orders Placed This Encounter  Procedures   Wet prep, genital    Standing Status:   Standing    Number of Occurrences:   1   US OB LESS THAN 14 WEEKS WITH OB TRANSVAGINAL    Standing Status:   Standing    Number of Occurrences:   1    Symptom/Reason for Exam:   Vaginal bleeding affecting early pregnancy [[id number]]   CBC    Standing Status:   Standing    Number of Occurrences:   1   hCG, quantitative, pregnancy    Standing Status:   Standing    Number of Occurrences:   1   Comprehensive metabolic panel    Standing Status:   Standing    Number of Occurrences:   1     Recent Results (from the past 2160 hours)  Wet prep, genital     Status: Abnormal   Collection Time: 10/06/23  3:01 PM   Specimen: PATH Cytology Cervicovaginal Ancillary Only  Result Value Ref Range   Yeast Wet Prep HPF POC NONE SEEN NONE SEEN   Trich,  Wet Prep NONE SEEN NONE SEEN   Clue Cells Wet Prep HPF POC PRESENT (A) NONE SEEN   WBC, Wet Prep HPF POC >=10 (A) <10   Sperm NONE SEEN     Comment: Performed at [facility], [address], [place], [place] [address]  CBC     Status: Abnormal   Collection Time: 10/06/23  3:45 PM  Result Value Ref Range   WBC 6.1 4.0 - 10.5 K/uL   RBC 4.39 3.87 - 5.11 MIL/uL   Hemoglobin 11.8 (L) 12.0 - 15.0 g/dL   HCT 19.1 47.8 - 29.5 %   MCV 84.1 80.0 - 100.0 fL   MCH 26.9 26.0 - 34.0 pg   MCHC 32.0 30.0 - 36.0 g/dL   RDW 62.1 30.8 - 65.7 %   Platelets 270 150 - 400 K/uL   nRBC 0.0 0.0 - 0.2 %    Comment: Performed at [facility], [address] [place], [place] [address]  hCG, quantitative, pregnancy     Status: Abnormal   Collection Time: 10/06/23  3:45 PM  Result Value Ref Range   hCG, Beta Chain, Quant, S 241 (H) <5 mIU/mL    Comment:          GEST. AGE      CONC.  (mIU/mL)   <=1 WEEK        5 - 50     2 WEEKS       50 - 500     3 WEEKS       100 -  10,000     4 WEEKS     1,000 - 30,000     5 WEEKS     3,500 - 115,000   6-8 WEEKS     12,000 - 270,000    12 WEEKS     15,000 - 220,000        FEMALE AND NON-PREGNANT FEMALE:     LESS THAN 5 mIU/mL Performed at [facility], [address] [place], [place] [address]   Comprehensive metabolic panel     Status: None   Collection Time: 10/06/23  3:45 PM  Result Value Ref Range   Sodium 139 135 - 145 mmol/L   Potassium 4.0 3.5 - 5.1 mmol/L   Chloride 105 98 - 111 mmol/L   CO2 26 22 - 32 mmol/L   Glucose, Bld 86 70 - 99 mg/dL    Comment: Glucose reference range applies only to samples taken after fasting for at least 8 hours.   BUN 8 6 - 20 mg/dL   Creatinine, Ser 4.13 0.44 - 1.00 mg/dL   Calcium 9.2 8.9 - 24.4 mg/dL   Total Protein 6.9 6.5 - 8.1 g/dL   Albumin 3.5 3.5 - 5.0 g/dL   AST 17 15 - 41 U/L   ALT 13 0 - 44 U/L   Alkaline Phosphatase 42 38 - 126 U/L   Total Bilirubin 0.2 0.0 - 1.2 mg/dL   GFR, Estimated >01 >02 mL/min    Comment: (NOTE) Calculated using the CKD-EPI Creatinine Equation (2021)    Anion gap 8 5 - 15    Comment: Performed at [facility], [address], [place], [place] [address]      Study Result  Narrative & Impression  CLINICAL DATA:  Vaginal bleeding HCG of 241   EXAM: OBSTETRIC <14 WK [place] AND TRANSVAGINAL OB US   TECHNIQUE: Both transabdominal and transvaginal ultrasound examinations were performed  for complete evaluation of the gestation as well as the maternal uterus, adnexal regions, and pelvic cul-de-sac. Transvaginal technique was performed to assess early pregnancy.   COMPARISON:  None Available.   FINDINGS: Intrauterine gestational sac: None   Yolk sac:  Not Visualized.   Embryo:  Not Visualized.   Maternal uterus/adnexae: Right ovary measures 2.4 x 1.3 x 2.3 cm. Left ovary measures 4.4 x 2.1 x 2.6 cm. Heterogenous left adnexal mass measuring 1.7 x 1.8 x 1.7 cm, this is immediately adjacent to the left ovary. Small  volume free fluid in the left adnexa.   IMPRESSION: 1. No IUP identified. Complex heterogenous left adnexal mass measuring 1.8 cm immediately adjacent to left ovary. Findings raise concern for left ectopic pregnancy though given close proximity to left ovary, exophytic corpus luteum is included in the differential. Trace free fluid at the left adnexa. 2. Critical Value/emergent results were called by telephone at the time of interpretation on 10/06/2023 at 5:21 pm to provider Marcell Barlow , who verbally acknowledged these results.     Electronically Signed   By: Jasmine Pang M.D.   On: 10/06/2023 17:22       ASSESSMENT Medical screening exam complete Concern for Left Ectopic Pregnancy vs early miscarriage   1. Ectopic pregnancy, unspecified location, unspecified whether intrauterine pregnancy present (Primary)  2. Vaginal bleeding affecting early pregnancy  3. [redacted] weeks gestation of pregnancy  4. Cramping affecting pregnancy, antepartum  5. Elevated serum hCG    PLAN Per Dr Berton Lan [facility] Attending ) at 1753 Repeat hcg levels in 48 hours (3/15) in  the MAU  with strict precautions   Discharge from MAU in stable condition Warning signs for worsening condition that would warrant emergency follow-up discussed SEE AVS for full description of verbal and written instructions provided to the patient upon discharge   Colman Cater, NP 10/06/2023 5:55 PM

## 2023-10-07 ENCOUNTER — Inpatient Hospital Stay (HOSPITAL_COMMUNITY)

## 2023-10-07 ENCOUNTER — Inpatient Hospital Stay (HOSPITAL_COMMUNITY)
Admission: AD | Admit: 2023-10-07 | Discharge: 2023-10-07 | Disposition: A | Discharge status: Home / Self Care | Attending: Obstetrics and Gynecology | Admitting: Obstetrics and Gynecology

## 2023-10-07 DIAGNOSIS — Z3A00 Weeks of gestation of pregnancy not specified: Secondary | ICD-10-CM | POA: Diagnosis not present

## 2023-10-07 DIAGNOSIS — Z3A01 Less than 8 weeks gestation of pregnancy: Secondary | ICD-10-CM | POA: Diagnosis not present

## 2023-10-07 DIAGNOSIS — R102 Pelvic and perineal pain: Secondary | ICD-10-CM | POA: Diagnosis not present

## 2023-10-07 DIAGNOSIS — O26891 Other specified pregnancy related conditions, first trimester: Secondary | ICD-10-CM | POA: Insufficient documentation

## 2023-10-07 DIAGNOSIS — O09521 Supervision of elderly multigravida, first trimester: Secondary | ICD-10-CM | POA: Diagnosis not present

## 2023-10-07 DIAGNOSIS — O209 Hemorrhage in early pregnancy, unspecified: Secondary | ICD-10-CM | POA: Diagnosis not present

## 2023-10-07 DIAGNOSIS — O26899 Other specified pregnancy related conditions, unspecified trimester: Secondary | ICD-10-CM | POA: Diagnosis not present

## 2023-10-07 DIAGNOSIS — Z9221 Personal history of antineoplastic chemotherapy: Secondary | ICD-10-CM

## 2023-10-07 DIAGNOSIS — O09519 Supervision of elderly primigravida, unspecified trimester: Secondary | ICD-10-CM | POA: Diagnosis not present

## 2023-10-07 DIAGNOSIS — O00202 Left ovarian pregnancy without intrauterine pregnancy: Secondary | ICD-10-CM

## 2023-10-07 LAB — CBC
HCT: 37.3 % (ref 36.0–46.0)
Hemoglobin: 12.1 g/dL (ref 12.0–15.0)
MCH: 27.2 pg (ref 26.0–34.0)
MCHC: 32.4 g/dL (ref 30.0–36.0)
MCV: 83.8 fL (ref 80.0–100.0)
Platelets: 275 10*3/uL (ref 150–400)
RBC: 4.45 MIL/uL (ref 3.87–5.11)
RDW: 12 % (ref 11.5–15.5)
WBC: 7.5 10*3/uL (ref 4.0–10.5)
nRBC: 0 % (ref 0.0–0.2)

## 2023-10-07 LAB — COMPREHENSIVE METABOLIC PANEL
ALT: 12 U/L (ref 0–44)
AST: 18 U/L (ref 15–41)
Albumin: 3.5 g/dL (ref 3.5–5.0)
Alkaline Phosphatase: 45 U/L (ref 38–126)
Anion gap: 10 (ref 5–15)
BUN: 7 mg/dL (ref 6–20)
CO2: 24 mmol/L (ref 22–32)
Calcium: 9.2 mg/dL (ref 8.9–10.3)
Chloride: 107 mmol/L (ref 98–111)
Creatinine, Ser: 1.11 mg/dL — ABNORMAL HIGH (ref 0.44–1.00)
GFR, Estimated: >60 mL/min (ref >60)
Glucose, Bld: 88 mg/dL (ref 70–99)
Potassium: 3.9 mmol/L (ref 3.5–5.1)
Sodium: 141 mmol/L (ref 135–145)
Total Bilirubin: 0.3 mg/dL (ref 0.0–1.2)
Total Protein: 7 g/dL (ref 6.5–8.1)

## 2023-10-07 LAB — TYPE AND SCREEN
ABO/RH(D): A POS
Antibody Screen: NEGATIVE

## 2023-10-07 LAB — HCG, QUANTITATIVE, PREGNANCY: hCG, Beta Chain, Quant, S: 77 m[IU]/mL — ABNORMAL HIGH (ref <5)

## 2023-10-07 LAB — GC/CHLAMYDIA PROBE AMP (~~LOC~~) NOT AT ARMC
Chlamydia: NEGATIVE
Comment: NEGATIVE
Comment: NORMAL
Neisseria Gonorrhea: NEGATIVE

## 2023-10-07 MED ORDER — TRAMADOL HCL 50 MG PO TABS: 50.0000 mg | ORAL_TABLET | Freq: Four times a day (QID) | ORAL | 0 refills | Status: AC | PRN

## 2023-10-07 MED ORDER — METHOTREXATE FOR ECTOPIC PREGNANCY
50.0000 mg/m2 | Freq: Once | INTRAMUSCULAR | Status: AC
  Administered 2023-10-07: 95 mg via INTRAMUSCULAR
  Filled 2023-10-07: qty 3.8

## 2023-10-07 MED ORDER — PROMETHAZINE HCL 12.5 MG PO TABS: 12.5000 mg | ORAL_TABLET | Freq: Four times a day (QID) | ORAL | 0 refills | Status: AC | PRN

## 2023-10-07 NOTE — MAU Provider Note (Signed)
 Chief Complaint: Vaginal Bleeding   None     SUBJECTIVE HPI: Taylor Bowen is a 38 y.o. [id number] at [redacted]w[redacted]d by LMP who presents to maternity admissions reporting worsening abdominal pain. She was initially seen 10/06/23 for abdominal pain and vaginal bleeding and had left adnexal mass and hcg of 241.  Given the possibility of hemorrhagic cyst, and pt appearance, plan was for pt to return in 48  hours for repeat hcg, but sooner if pain worsened.      HPI  Past Medical History:  Diagnosis Date   Anemia    Constipation    Resolved per patient 08/01/17   Fibroid    Genital warts    acid treatment in 2007   GERD (gastroesophageal reflux disease)    occasional   History of blood transfusion 05/25/2017   due to fibroids   Pregnancy induced hypertension    Urinary tract infection    UTI (urinary tract infection)    Past Surgical History:  Procedure Laterality Date   HERNIA REPAIR     umbilical   INCISIONAL HERNIA REPAIR N/A 04/16/2021   Procedure: OPEN HERNIA REPAIR INCISIONAL WITH MESH;  Surgeon: Kinsinger, De Blanch, MD;  Location: [facility];  Service: General;  Laterality: N/A;   MYOMECTOMY N/A 08/11/2017   Procedure: Claria Dice;  Surgeon: Allie Bossier, MD;  Location: [facility];  Service: Gynecology;  Laterality: N/A;   WISDOM TOOTH EXTRACTION     Social History   Socioeconomic History   Marital status: Single    Spouse name: Not on file   Number of children: Not on file   Years of education: Not on file   Highest education level: Not on file  Occupational History   Not on file  Tobacco Use   Smoking status: Former    Current packs/day: 0.00    Average packs/day: 1 pack/day for 3.0 years (3.0 ttl pk-yrs)    Types: Cigarettes    Start date: 01/15/2008    Quit date: 01/15/2011    Years since quitting: 12.7   Smokeless tobacco: Never   Tobacco comments:    Stopped 2 weeks ago  Vaping Use   Vaping status: Former  Substance and Sexual Activity   Alcohol use: Not Currently     Comment: occasional   Drug use: No   Sexual activity: Yes    Birth control/protection: Implant    Comment: nexplanon to be removed May or June 2019  Other Topics Concern   Not on file  Social History Narrative   Not on file   Social Drivers of Health   Financial Resource Strain: Not on file  Food Insecurity: Not on file  Transportation Needs: Not on file  Physical Activity: Not on file  Stress: Not on file  Social Connections: Not on file  Intimate Partner Violence: Not on file   No current facility-administered medications on file prior to encounter.   No current outpatient medications on file prior to encounter.   No Known Allergies  ROS:  Review of Systems   I have reviewed patient's Past Medical Hx, Surgical Hx, Family Hx, Social Hx, medications and allergies.   Physical Exam  Patient Vitals for the past 24 hrs:  BP Temp Pulse Resp Height Weight  10/07/23 1636 122/69 98.7 F (37.1 C) (!) 54 18 5\' 6"  (1.676 m) 78.4 kg   Constitutional: Well-developed, well-nourished female in no acute distress.  Cardiovascular: normal rate Respiratory: normal effort GI: Abd soft, non-tender. Pos BS x 4  MS: Extremities nontender, no edema, normal ROM Neurologic: Alert and oriented x 4.  GU: Neg CVAT.  PELVIC EXAM: Deferred    LAB RESULTS Results for orders placed or performed during the hospital encounter of 10/07/23 (from the past 24 hours)  Type and screen [facility]     Status: None   Collection Time: 10/07/23  4:38 PM  Result Value Ref Range   ABO/RH(D) A POS    Antibody Screen NEG    Sample Expiration      10/10/2023,2359 Performed at [facility], [address], [place], [place] [address]   hCG, quantitative, pregnancy     Status: Abnormal   Collection Time: 10/07/23  4:41 PM  Result Value Ref Range   hCG, Beta Chain, Quant, S 77 (H) <5 mIU/mL  Comprehensive metabolic panel     Status: Abnormal   Collection Time: 10/07/23  4:41 PM   Result Value Ref Range   Sodium 141 135 - 145 mmol/L   Potassium 3.9 3.5 - 5.1 mmol/L   Chloride 107 98 - 111 mmol/L   CO2 24 22 - 32 mmol/L   Glucose, Bld 88 70 - 99 mg/dL   BUN 7 6 - 20 mg/dL   Creatinine, Ser 6.04 (H) 0.44 - 1.00 mg/dL   Calcium 9.2 8.9 - 54.0 mg/dL   Total Protein 7.0 6.5 - 8.1 g/dL   Albumin 3.5 3.5 - 5.0 g/dL   AST 18 15 - 41 U/L   ALT 12 0 - 44 U/L   Alkaline Phosphatase 45 38 - 126 U/L   Total Bilirubin 0.3 0.0 - 1.2 mg/dL   GFR, Estimated >98 >11 mL/min   Anion gap 10 5 - 15  CBC     Status: None   Collection Time: 10/07/23  4:41 PM  Result Value Ref Range   WBC 7.5 4.0 - 10.5 K/uL   RBC 4.45 3.87 - 5.11 MIL/uL   Hemoglobin 12.1 12.0 - 15.0 g/dL   HCT 91.4 78.2 - 95.6 %   MCV 83.8 80.0 - 100.0 fL   MCH 27.2 26.0 - 34.0 pg   MCHC 32.4 30.0 - 36.0 g/dL   RDW 21.3 08.6 - 57.8 %   Platelets 275 150 - 400 K/uL   nRBC 0.0 0.0 - 0.2 %    --/--/A POS (03/14 1638)  IMAGING US OB Transvaginal Result Date: 10/07/2023 CLINICAL DATA:  Worsening pain, vaginal bleeding EXAM: TRANSVAGINAL OB ULTRASOUND TECHNIQUE: Transvaginal ultrasound was performed for complete evaluation of the gestation as well as the maternal uterus, adnexal regions, and pelvic cul-de-sac. COMPARISON:  10/06/2023 FINDINGS: Intrauterine gestational sac: None Yolk sac:  Not Visualized. Embryo:  Not Visualized. Cardiac Activity: Not Visualized. Heart Rate:  bpm MSD:   mm    w     d CRL:     mm    w  d                  [place] EDC: Subchorionic hemorrhage:  None visualized. Maternal uterus/adnexae: Again noted is the soft tissue area immediately adjacent to the left ovary, currently measuring 1.6 x 1.6 x 1.2 cm compared to 1.8 x 1.7 x 1.7 cm yesterday. Differential considerations remain the same including ectopic pregnancy or exophytic corpus luteum. No free fluid. IMPRESSION: No intrauterine pregnancy. Continued soft tissue immediately adjacent to/abutting the left ovary measuring 1.6 x 1.6 x 1.2 cm.  Differential considerations remain the same as yesterday, including ectopic pregnancy or exophytic  corpus luteum. Electronically Signed   By: Charlett Nose M.D.   On: 10/07/2023 19:34   US OB LESS THAN 14 WEEKS WITH OB TRANSVAGINAL Result Date: 10/06/2023 CLINICAL DATA:  Vaginal bleeding HCG of 241 EXAM: OBSTETRIC <14 WK [place] AND TRANSVAGINAL OB US TECHNIQUE: Both transabdominal and transvaginal ultrasound examinations were performed for complete evaluation of the gestation as well as the maternal uterus, adnexal regions, and pelvic cul-de-sac. Transvaginal technique was performed to assess early pregnancy. COMPARISON:  None Available. FINDINGS: Intrauterine gestational sac: None Yolk sac:  Not Visualized. Embryo:  Not Visualized. Maternal uterus/adnexae: Right ovary measures 2.4 x 1.3 x 2.3 cm. Left ovary measures 4.4 x 2.1 x 2.6 cm. Heterogenous left adnexal mass measuring 1.7 x 1.8 x 1.7 cm, this is immediately adjacent to the left ovary. Small volume free fluid in the left adnexa. IMPRESSION: 1. No IUP identified. Complex heterogenous left adnexal mass measuring 1.8 cm immediately adjacent to left ovary. Findings raise concern for left ectopic pregnancy though given close proximity to left ovary, exophytic corpus luteum is included in the differential. Trace free fluid at the left adnexa. 2. Critical Value/emergent results were called by telephone at the time of interpretation on 10/06/2023 at 5:21 pm to provider Marcell Barlow , who verbally acknowledged these results. Electronically Signed   By: Jasmine Pang M.D.   On: 10/06/2023 17:22    MAU Management/MDM: Orders Placed This Encounter  Procedures   US OB Transvaginal   hCG, quantitative, pregnancy   Comprehensive metabolic panel   CBC   Type and screen [facility]    Meds ordered this encounter  Medications   methotrexate (for ectopic pregnancy) 25 mg/mL chemo injection   traMADol (ULTRAM) 50 MG tablet    Sig: Take 1-2 tablets  (50-100 mg total) by mouth every 6 (six) hours as needed.    Dispense:  10 tablet    Refill:  0    Supervising Provider:   Milas Hock [[id number]]   promethazine (PHENERGAN) 12.5 MG tablet    Sig: Take 1 tablet (12.5 mg total) by mouth every 6 (six) hours as needed for nausea or vomiting.    Dispense:  30 tablet    Refill:  0    Supervising Provider:   Milas Hock [[id number]]    Consult Dr Despina Hidden with assessment and findings.  [place] suspicious for ectopic pregnancy. Despite dropping hcg, ectopic pregnancy still a risk for pt so methotrexate recommended. No evidence of IUP and dropping hcg diagnotic of failed pregnancy.  Discussed with patient who agrees with plan of care to receive methotrexate tonight.  Pt stable at time of discharge.  F/U scheduled at [facility].    ASSESSMENT 1. Ectopic pregnancy of left ovary   2. [redacted] weeks gestation of pregnancy     PLAN Discharge home    Sharen Counter Certified Nurse-Midwife 10/07/2023  8:59 PM

## 2023-10-07 NOTE — MAU Note (Signed)
.  Taylor Bowen is a 38 y.o. at [redacted]w[redacted]d here in MAU reporting: was scene yesterday in MAU for r/o ectopic pregnancy. Was told to come back if bleeding and pain were worse. Pt stated  pain is worse. And bleeding is much heavier. Saturated panty liner and passing clots in the toilet.   LMP:  Onset of complaint: today Pain score: 9 Vitals:   10/07/23 1636  BP: 122/69  Pulse: (!) 54  Resp: 18  Temp: 98.7 F (37.1 C)     FHT: n/a  Lab orders placed from triage:

## 2023-10-10 ENCOUNTER — Ambulatory Visit: Admitting: *Deleted

## 2023-10-10 ENCOUNTER — Other Ambulatory Visit: Payer: Self-pay

## 2023-10-10 ENCOUNTER — Ambulatory Visit: Payer: Self-pay

## 2023-10-10 VITALS — BP 128/75 | HR 64 | Ht 66.0 in | Wt 172.5 lb

## 2023-10-10 DIAGNOSIS — Z3A01 Less than 8 weeks gestation of pregnancy: Secondary | ICD-10-CM

## 2023-10-10 DIAGNOSIS — O00202 Left ovarian pregnancy without intrauterine pregnancy: Secondary | ICD-10-CM | POA: Diagnosis not present

## 2023-10-10 LAB — BETA HCG QUANT (REF LAB): hCG Quant: 11 m[IU]/mL

## 2023-10-10 NOTE — Progress Notes (Signed)
 Here for stat bhcg, reports less bleeding today and is more like spotting today. Reports less pain than when at MAU, is more like mild cramping. Stat bhcg drawn. Explained I will call her with results and plan of care in a few hours. She voices understanding. Nancy Fetter 4:49 Received stat bhcg results =11. Reviewed results, history, assessment with Albertine Grates, NP. No new orders- continue with stat bhcg 10/13/23 for day 7 after methotrexate and follow ectopic precautions. I called Irais and gave her results and recommendations. She voices understanding. Nancy Fetter

## 2023-10-13 ENCOUNTER — Other Ambulatory Visit: Payer: Self-pay

## 2023-10-13 ENCOUNTER — Ambulatory Visit (INDEPENDENT_AMBULATORY_CARE_PROVIDER_SITE_OTHER): Payer: Self-pay

## 2023-10-13 VITALS — BP 123/72 | HR 57 | Wt 172.2 lb

## 2023-10-13 DIAGNOSIS — O00202 Left ovarian pregnancy without intrauterine pregnancy: Secondary | ICD-10-CM | POA: Diagnosis not present

## 2023-10-13 DIAGNOSIS — Z3A01 Less than 8 weeks gestation of pregnancy: Secondary | ICD-10-CM

## 2023-10-13 LAB — BETA HCG QUANT (REF LAB): hCG Quant: 3 m[IU]/mL

## 2023-10-13 NOTE — Progress Notes (Signed)
 Beta HCG Follow-up Visit  Taylor Bowen presents to [facility] for HCG lab. She was seen in MAU and given methotrexate for treatment of ectopic pregnancy on 10/07/23. No bleeding today. Mild abdominal pain, rated 1/10 on pain scale. Taking Tylenol for headaches. Discussed with patient that we are following beta HCG levels today. Explained I will call patient with results if they return prior to 5:30 PM. If not, she will be called tomorrow morning unless urgent.  Beta HCG results: 10/06/23 241  10/07/23 DAY 1 77  10/10/23 DAY 4 11  10/13/23 DAY 7 3   Results and patient history reviewed with Donavan Foil, MD, who states no further follow up of HCG level needed. Called pt; VM left stating I am calling with non-urgent results and will send MyChart message. Front office will contact patient to schedule provider follow up.  [name] 10/13/2023 2:14 PM

## 2023-11-02 ENCOUNTER — Telehealth: Payer: Self-pay | Admitting: Family Medicine

## 2023-11-02 NOTE — Telephone Encounter (Signed)
 Left a message for patient to call office about scheduling an appointment.

## 2023-11-16 ENCOUNTER — Ambulatory Visit: Admitting: Certified Nurse Midwife

## 2023-11-21 ENCOUNTER — Ambulatory Visit: Admitting: Obstetrics and Gynecology

## 2023-11-23 ENCOUNTER — Ambulatory Visit: Admitting: Obstetrics and Gynecology

## 2023-12-19 ENCOUNTER — Telehealth: Admitting: Physician Assistant

## 2023-12-19 ENCOUNTER — Encounter: Payer: Self-pay | Admitting: Physician Assistant

## 2023-12-19 DIAGNOSIS — R3989 Other symptoms and signs involving the genitourinary system: Secondary | ICD-10-CM | POA: Diagnosis not present

## 2023-12-19 MED ORDER — CEPHALEXIN 500 MG PO CAPS: 500.0000 mg | ORAL_CAPSULE | Freq: Two times a day (BID) | ORAL | 0 refills | Status: AC

## 2023-12-19 NOTE — Progress Notes (Signed)
 I have spent 5 minutes in review of e-visit questionnaire, review and updating patient chart, medical decision making and response to patient.   Piedad Climes, PA-C

## 2023-12-19 NOTE — Progress Notes (Signed)

## 2024-01-01 ENCOUNTER — Telehealth: Admitting: Nurse Practitioner

## 2024-01-01 DIAGNOSIS — B3731 Acute candidiasis of vulva and vagina: Secondary | ICD-10-CM

## 2024-01-01 MED ORDER — FLUCONAZOLE 150 MG PO TABS: 150.0000 mg | ORAL_TABLET | Freq: Every day | ORAL | 0 refills | Status: AC

## 2024-01-01 NOTE — Progress Notes (Signed)
 I have spent 5 minutes in review of e-visit questionnaire, review and updating patient chart, medical decision making and response to patient.   Claiborne Rigg, NP

## 2024-01-01 NOTE — Progress Notes (Signed)

## 2024-02-14 ENCOUNTER — Telehealth: Admitting: Family Medicine

## 2024-02-14 DIAGNOSIS — N898 Other specified noninflammatory disorders of vagina: Secondary | ICD-10-CM

## 2024-02-14 NOTE — Progress Notes (Signed)
 For the safety of you and your child, I recommend a face to face office visit with a health care provider.  Many mothers need to take medicines during their pregnancy and while nursing.  Almost all medicines pass into the breast milk in small quantities.  Most are generally considered safe for a mother to take but some medicines must be avoided.  After reviewing your E-Visit request, I recommend that you consult your OB/GYN or pediatrician for medical advice in relation to your condition and prescription medications while pregnant or breastfeeding.  NOTE: There will be NO CHARGE for this E-Visit

## 2024-02-27 DIAGNOSIS — N912 Amenorrhea, unspecified: Secondary | ICD-10-CM | POA: Diagnosis not present

## 2024-02-27 DIAGNOSIS — Z8759 Personal history of other complications of pregnancy, childbirth and the puerperium: Secondary | ICD-10-CM | POA: Diagnosis not present

## 2024-03-20 DIAGNOSIS — N912 Amenorrhea, unspecified: Secondary | ICD-10-CM | POA: Diagnosis not present

## 2024-03-20 DIAGNOSIS — Z369 Encounter for antenatal screening, unspecified: Secondary | ICD-10-CM | POA: Diagnosis not present

## 2024-03-20 DIAGNOSIS — O26841 Uterine size-date discrepancy, first trimester: Secondary | ICD-10-CM | POA: Diagnosis not present
